# Patient Record
Sex: Female | Born: 1935 | Race: Black or African American | Hispanic: No | Marital: Married | Smoking: Never smoker
Health system: Southern US, Community
[De-identification: ages and names within clinical notes are randomized; demographics above are authoritative.]

## PROBLEM LIST (undated history)

## (undated) DIAGNOSIS — I479 Paroxysmal tachycardia, unspecified: Secondary | ICD-10-CM

## (undated) DIAGNOSIS — Z860100 Personal history of colon polyps, unspecified: Secondary | ICD-10-CM

## (undated) DIAGNOSIS — E785 Hyperlipidemia, unspecified: Secondary | ICD-10-CM

## (undated) DIAGNOSIS — Z800 Family history of malignant neoplasm of digestive organs: Secondary | ICD-10-CM

## (undated) DIAGNOSIS — D649 Anemia, unspecified: Secondary | ICD-10-CM

## (undated) DIAGNOSIS — K5730 Diverticulosis of large intestine without perforation or abscess without bleeding: Secondary | ICD-10-CM

## (undated) DIAGNOSIS — I10 Essential (primary) hypertension: Secondary | ICD-10-CM

## (undated) DIAGNOSIS — K219 Gastro-esophageal reflux disease without esophagitis: Secondary | ICD-10-CM

## (undated) DIAGNOSIS — C649 Malignant neoplasm of unspecified kidney, except renal pelvis: Secondary | ICD-10-CM

## (undated) HISTORY — DX: Diverticulosis of large intestine without perforation or abscess without bleeding: K57.30

## (undated) HISTORY — PX: CATARACT EXTRACTION, BILATERAL: SHX1313

## (undated) HISTORY — DX: Paroxysmal tachycardia, unspecified: I47.9

## (undated) HISTORY — DX: Essential (primary) hypertension: I10

## (undated) HISTORY — DX: Family history of malignant neoplasm of digestive organs: Z80.0

## (undated) HISTORY — DX: Personal history of colon polyps, unspecified: Z86.0100

## (undated) HISTORY — DX: Hyperlipidemia, unspecified: E78.5

## (undated) HISTORY — DX: Gastro-esophageal reflux disease without esophagitis: K21.9

## (undated) HISTORY — DX: Anemia, unspecified: D64.9

## (undated) HISTORY — DX: Personal history of colonic polyps: Z86.010

---

## 1970-08-08 HISTORY — PX: ABDOMINAL HYSTERECTOMY: SHX81

## 1992-03-20 ENCOUNTER — Encounter (INDEPENDENT_AMBULATORY_CARE_PROVIDER_SITE_OTHER): Payer: Self-pay | Admitting: *Deleted

## 1993-08-19 ENCOUNTER — Encounter (INDEPENDENT_AMBULATORY_CARE_PROVIDER_SITE_OTHER): Payer: Self-pay | Admitting: *Deleted

## 1998-05-05 ENCOUNTER — Encounter (INDEPENDENT_AMBULATORY_CARE_PROVIDER_SITE_OTHER): Payer: Self-pay | Admitting: *Deleted

## 2002-11-21 ENCOUNTER — Encounter: Payer: Self-pay | Admitting: Internal Medicine

## 2003-05-15 ENCOUNTER — Encounter: Payer: Self-pay | Admitting: Internal Medicine

## 2004-10-06 ENCOUNTER — Ambulatory Visit: Payer: Self-pay | Admitting: Internal Medicine

## 2005-03-14 ENCOUNTER — Ambulatory Visit: Payer: Self-pay | Admitting: Family Medicine

## 2005-03-28 ENCOUNTER — Ambulatory Visit: Payer: Self-pay | Admitting: Family Medicine

## 2005-04-12 ENCOUNTER — Ambulatory Visit: Payer: Self-pay | Admitting: Internal Medicine

## 2005-09-08 HISTORY — PX: BREAST LUMPECTOMY: SHX2

## 2005-09-23 ENCOUNTER — Encounter: Admission: RE | Admit: 2005-09-23 | Discharge: 2005-09-23 | Payer: Self-pay | Admitting: General Surgery

## 2005-10-10 ENCOUNTER — Encounter (INDEPENDENT_AMBULATORY_CARE_PROVIDER_SITE_OTHER): Payer: Self-pay | Admitting: *Deleted

## 2005-10-10 ENCOUNTER — Ambulatory Visit (HOSPITAL_BASED_OUTPATIENT_CLINIC_OR_DEPARTMENT_OTHER): Admission: RE | Admit: 2005-10-10 | Discharge: 2005-10-10 | Payer: Self-pay | Admitting: General Surgery

## 2005-10-21 ENCOUNTER — Ambulatory Visit: Payer: Self-pay | Admitting: Internal Medicine

## 2006-04-05 ENCOUNTER — Ambulatory Visit: Payer: Self-pay | Admitting: Internal Medicine

## 2006-04-17 ENCOUNTER — Ambulatory Visit: Payer: Self-pay | Admitting: Internal Medicine

## 2006-05-17 ENCOUNTER — Ambulatory Visit: Payer: Self-pay | Admitting: Internal Medicine

## 2006-09-08 HISTORY — PX: BREAST BIOPSY: SHX20

## 2006-09-15 ENCOUNTER — Encounter: Admission: RE | Admit: 2006-09-15 | Discharge: 2006-09-15 | Payer: Self-pay | Admitting: Internal Medicine

## 2006-09-26 ENCOUNTER — Encounter: Admission: RE | Admit: 2006-09-26 | Discharge: 2006-09-26 | Payer: Self-pay | Admitting: Internal Medicine

## 2006-09-29 ENCOUNTER — Encounter (INDEPENDENT_AMBULATORY_CARE_PROVIDER_SITE_OTHER): Payer: Self-pay | Admitting: Specialist

## 2006-09-29 ENCOUNTER — Encounter: Admission: RE | Admit: 2006-09-29 | Discharge: 2006-09-29 | Payer: Self-pay | Admitting: Internal Medicine

## 2006-10-16 ENCOUNTER — Ambulatory Visit: Payer: Self-pay | Admitting: Internal Medicine

## 2006-10-16 LAB — CONVERTED CEMR LAB
ALT: 25 units/L (ref 0–40)
Albumin: 3.8 g/dL (ref 3.5–5.2)
BUN: 10 mg/dL (ref 6–23)
CO2: 33 meq/L — ABNORMAL HIGH (ref 19–32)
Calcium: 9.9 mg/dL (ref 8.4–10.5)
Chloride: 96 meq/L (ref 96–112)
Cholesterol: 220 mg/dL (ref 0–200)
Creatinine, Ser: 0.9 mg/dL (ref 0.4–1.2)
Creatinine,U: 36.3 mg/dL
Direct LDL: 154.1 mg/dL
GFR calc Af Amer: 80 mL/min
GFR calc non Af Amer: 66 mL/min
Glucose, Bld: 76 mg/dL (ref 70–99)
HDL: 45.3 mg/dL (ref >39.0)
Hgb A1c MFr Bld: 7.9 % — ABNORMAL HIGH (ref 4.6–6.0)
LDL Cholesterol: 131 mg/dL — ABNORMAL HIGH (ref 0–99)
Microalb Creat Ratio: 5.5 mg/g (ref 0.0–30.0)
Microalb, Ur: 0.2 mg/dL (ref 0.0–1.9)
Phosphorus: 3.7 mg/dL (ref 2.3–4.6)
Potassium: 3.7 meq/L (ref 3.5–5.1)
Sodium: 139 meq/L (ref 135–145)
Total CHOL/HDL Ratio: 4.9
Triglycerides: 217 mg/dL (ref 0–149)
VLDL: 43 mg/dL — ABNORMAL HIGH (ref 0–40)

## 2006-12-22 ENCOUNTER — Telehealth: Payer: Self-pay | Admitting: Internal Medicine

## 2007-03-19 ENCOUNTER — Telehealth (INDEPENDENT_AMBULATORY_CARE_PROVIDER_SITE_OTHER): Payer: Self-pay | Admitting: *Deleted

## 2007-04-05 DIAGNOSIS — E119 Type 2 diabetes mellitus without complications: Secondary | ICD-10-CM

## 2007-04-05 DIAGNOSIS — K5730 Diverticulosis of large intestine without perforation or abscess without bleeding: Secondary | ICD-10-CM | POA: Insufficient documentation

## 2007-04-05 DIAGNOSIS — I10 Essential (primary) hypertension: Secondary | ICD-10-CM | POA: Insufficient documentation

## 2007-04-05 DIAGNOSIS — E785 Hyperlipidemia, unspecified: Secondary | ICD-10-CM | POA: Insufficient documentation

## 2007-04-05 DIAGNOSIS — K219 Gastro-esophageal reflux disease without esophagitis: Secondary | ICD-10-CM

## 2007-04-05 DIAGNOSIS — J309 Allergic rhinitis, unspecified: Secondary | ICD-10-CM | POA: Insufficient documentation

## 2007-04-18 ENCOUNTER — Ambulatory Visit: Payer: Self-pay | Admitting: Internal Medicine

## 2007-04-19 LAB — CONVERTED CEMR LAB
ALT: 34 units/L (ref 0–35)
Albumin: 4.2 g/dL (ref 3.5–5.2)
BUN: 14 mg/dL (ref 6–23)
CO2: 29 meq/L (ref 19–32)
Calcium: 9.9 mg/dL (ref 8.4–10.5)
Chloride: 101 meq/L (ref 96–112)
Cholesterol: 169 mg/dL (ref 0–200)
Creatinine, Ser: 0.9 mg/dL (ref 0.4–1.2)
GFR calc Af Amer: 80 mL/min
GFR calc non Af Amer: 66 mL/min
Glucose, Bld: 116 mg/dL — ABNORMAL HIGH (ref 70–99)
HDL: 46.7 mg/dL (ref >39.0)
Hgb A1c MFr Bld: 7.8 % — ABNORMAL HIGH (ref 4.6–6.0)
LDL Cholesterol: 91 mg/dL (ref 0–99)
Phosphorus: 4.2 mg/dL (ref 2.3–4.6)
Potassium: 4 meq/L (ref 3.5–5.1)
Sodium: 140 meq/L (ref 135–145)
Total CHOL/HDL Ratio: 3.6
Triglycerides: 159 mg/dL — ABNORMAL HIGH (ref 0–149)
VLDL: 32 mg/dL (ref 0–40)

## 2007-07-16 ENCOUNTER — Telehealth (INDEPENDENT_AMBULATORY_CARE_PROVIDER_SITE_OTHER): Payer: Self-pay | Admitting: *Deleted

## 2007-09-17 ENCOUNTER — Encounter: Admission: RE | Admit: 2007-09-17 | Discharge: 2007-09-17 | Payer: Self-pay | Admitting: Internal Medicine

## 2007-09-18 ENCOUNTER — Encounter (INDEPENDENT_AMBULATORY_CARE_PROVIDER_SITE_OTHER): Payer: Self-pay | Admitting: *Deleted

## 2007-10-16 ENCOUNTER — Ambulatory Visit: Payer: Self-pay | Admitting: Internal Medicine

## 2007-10-22 LAB — CONVERTED CEMR LAB
ALT: 44 units/L — ABNORMAL HIGH (ref 0–35)
AST: 50 units/L — ABNORMAL HIGH (ref 0–37)
Albumin: 4.4 g/dL (ref 3.5–5.2)
Alkaline Phosphatase: 66 units/L (ref 39–117)
BUN: 10 mg/dL (ref 6–23)
Basophils Absolute: 0 10*3/uL (ref 0.0–0.1)
Basophils Relative: 0.3 % (ref 0.0–1.0)
Bilirubin, Direct: 0.1 mg/dL (ref 0.0–0.3)
CO2: 30 meq/L (ref 19–32)
Calcium: 10 mg/dL (ref 8.4–10.5)
Chloride: 100 meq/L (ref 96–112)
Cholesterol: 148 mg/dL (ref 0–200)
Creatinine, Ser: 0.9 mg/dL (ref 0.4–1.2)
Creatinine,U: 38.8 mg/dL
Eosinophils Absolute: 0.1 10*3/uL (ref 0.0–0.6)
Eosinophils Relative: 1.4 % (ref 0.0–5.0)
GFR calc Af Amer: 80 mL/min
GFR calc non Af Amer: 66 mL/min
Glucose, Bld: 212 mg/dL — ABNORMAL HIGH (ref 70–99)
HCT: 38 % (ref 36.0–46.0)
HDL: 42.4 mg/dL (ref >39.0)
Hemoglobin: 12.3 g/dL (ref 12.0–15.0)
Hgb A1c MFr Bld: 8.8 % — ABNORMAL HIGH (ref 4.6–6.0)
LDL Cholesterol: 67 mg/dL (ref 0–99)
Lymphocytes Relative: 33.1 % (ref 12.0–46.0)
MCHC: 32.3 g/dL (ref 30.0–36.0)
MCV: 86.8 fL (ref 78.0–100.0)
Microalb Creat Ratio: 25.8 mg/g (ref 0.0–30.0)
Microalb, Ur: 1 mg/dL (ref 0.0–1.9)
Monocytes Absolute: 0.4 10*3/uL (ref 0.2–0.7)
Monocytes Relative: 6.1 % (ref 3.0–11.0)
Neutro Abs: 4.1 10*3/uL (ref 1.4–7.7)
Neutrophils Relative %: 59.1 % (ref 43.0–77.0)
Phosphorus: 4 mg/dL (ref 2.3–4.6)
Platelets: 203 10*3/uL (ref 150–400)
Potassium: 3.8 meq/L (ref 3.5–5.1)
RBC: 4.38 M/uL (ref 3.87–5.11)
RDW: 14.5 % (ref 11.5–14.6)
Sodium: 139 meq/L (ref 135–145)
TSH: 4.01 microintl units/mL (ref 0.35–5.50)
Total Bilirubin: 1.2 mg/dL (ref 0.3–1.2)
Total CHOL/HDL Ratio: 3.5
Total Protein: 7.5 g/dL (ref 6.0–8.3)
Triglycerides: 195 mg/dL — ABNORMAL HIGH (ref 0–149)
VLDL: 39 mg/dL (ref 0–40)
WBC: 6.9 10*3/uL (ref 4.5–10.5)

## 2007-10-29 ENCOUNTER — Ambulatory Visit: Payer: Self-pay | Admitting: Internal Medicine

## 2007-10-29 ENCOUNTER — Encounter: Payer: Self-pay | Admitting: Internal Medicine

## 2007-11-07 ENCOUNTER — Ambulatory Visit: Payer: Self-pay | Admitting: Internal Medicine

## 2007-11-23 ENCOUNTER — Telehealth: Payer: Self-pay | Admitting: Internal Medicine

## 2007-11-26 ENCOUNTER — Encounter: Payer: Self-pay | Admitting: Internal Medicine

## 2007-12-07 ENCOUNTER — Encounter: Payer: Self-pay | Admitting: Internal Medicine

## 2007-12-07 ENCOUNTER — Ambulatory Visit: Payer: Self-pay | Admitting: Internal Medicine

## 2007-12-10 ENCOUNTER — Encounter: Payer: Self-pay | Admitting: Family Medicine

## 2007-12-13 ENCOUNTER — Ambulatory Visit: Payer: Self-pay | Admitting: Internal Medicine

## 2007-12-13 ENCOUNTER — Encounter: Payer: Self-pay | Admitting: Internal Medicine

## 2007-12-13 DIAGNOSIS — Z860100 Personal history of colon polyps, unspecified: Secondary | ICD-10-CM | POA: Insufficient documentation

## 2007-12-28 ENCOUNTER — Ambulatory Visit: Payer: Self-pay | Admitting: Internal Medicine

## 2007-12-28 ENCOUNTER — Encounter: Payer: Self-pay | Admitting: Internal Medicine

## 2008-01-01 ENCOUNTER — Encounter: Payer: Self-pay | Admitting: Internal Medicine

## 2008-03-06 ENCOUNTER — Telehealth (INDEPENDENT_AMBULATORY_CARE_PROVIDER_SITE_OTHER): Payer: Self-pay | Admitting: *Deleted

## 2008-03-20 ENCOUNTER — Telehealth (INDEPENDENT_AMBULATORY_CARE_PROVIDER_SITE_OTHER): Payer: Self-pay | Admitting: *Deleted

## 2008-03-27 ENCOUNTER — Telehealth (INDEPENDENT_AMBULATORY_CARE_PROVIDER_SITE_OTHER): Payer: Self-pay | Admitting: *Deleted

## 2008-04-15 ENCOUNTER — Ambulatory Visit: Payer: Self-pay | Admitting: Internal Medicine

## 2008-04-16 LAB — CONVERTED CEMR LAB
ALT: 34 units/L (ref 0–35)
AST: 31 units/L (ref 0–37)
Albumin: 4.3 g/dL (ref 3.5–5.2)
Alkaline Phosphatase: 57 units/L (ref 39–117)
BUN: 13 mg/dL (ref 6–23)
Bilirubin, Direct: 0.1 mg/dL (ref 0.0–0.3)
CO2: 29 meq/L (ref 19–32)
Calcium: 9.7 mg/dL (ref 8.4–10.5)
Chloride: 105 meq/L (ref 96–112)
Cholesterol: 131 mg/dL (ref 0–200)
Creatinine, Ser: 0.8 mg/dL (ref 0.4–1.2)
GFR calc Af Amer: 91 mL/min
GFR calc non Af Amer: 76 mL/min
Glucose, Bld: 108 mg/dL — ABNORMAL HIGH (ref 70–99)
HDL: 44.5 mg/dL (ref >39.0)
Hgb A1c MFr Bld: 6.8 % — ABNORMAL HIGH (ref 4.6–6.0)
LDL Cholesterol: 68 mg/dL (ref 0–99)
Phosphorus: 4.3 mg/dL (ref 2.3–4.6)
Potassium: 4.1 meq/L (ref 3.5–5.1)
Sodium: 141 meq/L (ref 135–145)
Total Bilirubin: 1.2 mg/dL (ref 0.3–1.2)
Total CHOL/HDL Ratio: 2.9
Total Protein: 7.3 g/dL (ref 6.0–8.3)
Triglycerides: 91 mg/dL (ref 0–149)
VLDL: 18 mg/dL (ref 0–40)

## 2008-06-25 ENCOUNTER — Encounter: Payer: Self-pay | Admitting: Internal Medicine

## 2008-09-17 ENCOUNTER — Encounter: Admission: RE | Admit: 2008-09-17 | Discharge: 2008-09-17 | Payer: Self-pay | Admitting: Internal Medicine

## 2008-09-17 ENCOUNTER — Encounter: Payer: Self-pay | Admitting: Internal Medicine

## 2008-10-14 ENCOUNTER — Ambulatory Visit: Payer: Self-pay | Admitting: Internal Medicine

## 2008-10-15 ENCOUNTER — Telehealth: Payer: Self-pay | Admitting: Family Medicine

## 2008-10-16 ENCOUNTER — Encounter: Payer: Self-pay | Admitting: Internal Medicine

## 2008-10-16 LAB — CONVERTED CEMR LAB
Albumin: 4.1 g/dL (ref 3.5–5.2)
BUN: 11 mg/dL (ref 6–23)
Basophils Absolute: 0 10*3/uL (ref 0.0–0.1)
Basophils Relative: 0.5 % (ref 0.0–3.0)
CO2: 29 meq/L (ref 19–32)
Calcium: 9.5 mg/dL (ref 8.4–10.5)
Chloride: 101 meq/L (ref 96–112)
Creatinine, Ser: 0.8 mg/dL (ref 0.4–1.2)
Eosinophils Absolute: 0.1 10*3/uL (ref 0.0–0.7)
Eosinophils Relative: 1.1 % (ref 0.0–5.0)
Ferritin: 5.2 ng/mL — ABNORMAL LOW (ref 10.0–291.0)
GFR calc Af Amer: 91 mL/min
GFR calc non Af Amer: 75 mL/min
Glucose, Bld: 101 mg/dL — ABNORMAL HIGH (ref 70–99)
HCT: 33.2 % — ABNORMAL LOW (ref 36.0–46.0)
Hemoglobin: 10.7 g/dL — ABNORMAL LOW (ref 12.0–15.0)
Hgb A1c MFr Bld: 7.1 % — ABNORMAL HIGH (ref 4.6–6.0)
Lymphocytes Relative: 34 % (ref 12.0–46.0)
MCHC: 32.1 g/dL (ref 30.0–36.0)
MCV: 77.1 fL — ABNORMAL LOW (ref 78.0–100.0)
Monocytes Absolute: 0.5 10*3/uL (ref 0.1–1.0)
Monocytes Relative: 6.5 % (ref 3.0–12.0)
Neutro Abs: 4 10*3/uL (ref 1.4–7.7)
Neutrophils Relative %: 57.9 % (ref 43.0–77.0)
Phosphorus: 3.8 mg/dL (ref 2.3–4.6)
Platelets: 234 10*3/uL (ref 150–400)
Potassium: 4 meq/L (ref 3.5–5.1)
RBC: 4.31 M/uL (ref 3.87–5.11)
RDW: 18.3 % — ABNORMAL HIGH (ref 11.5–14.6)
Sodium: 138 meq/L (ref 135–145)
WBC: 7 10*3/uL (ref 4.5–10.5)

## 2008-10-17 ENCOUNTER — Telehealth: Payer: Self-pay | Admitting: Family Medicine

## 2008-11-27 ENCOUNTER — Ambulatory Visit: Payer: Self-pay | Admitting: Internal Medicine

## 2008-11-28 LAB — CONVERTED CEMR LAB
Basophils Absolute: 0 10*3/uL (ref 0.0–0.1)
Basophils Relative: 0.2 % (ref 0.0–3.0)
Eosinophils Absolute: 0.1 10*3/uL (ref 0.0–0.7)
Eosinophils Relative: 1.5 % (ref 0.0–5.0)
Ferritin: 7 ng/mL — ABNORMAL LOW (ref 10.0–291.0)
HCT: 33.3 % — ABNORMAL LOW (ref 36.0–46.0)
Hemoglobin: 11 g/dL — ABNORMAL LOW (ref 12.0–15.0)
Lymphocytes Relative: 33.8 % (ref 12.0–46.0)
Lymphs Abs: 2 10*3/uL (ref 0.7–4.0)
MCHC: 33.2 g/dL (ref 30.0–36.0)
MCV: 79.8 fL (ref 78.0–100.0)
Monocytes Absolute: 0.4 10*3/uL (ref 0.1–1.0)
Monocytes Relative: 6.8 % (ref 3.0–12.0)
Neutro Abs: 3.4 10*3/uL (ref 1.4–7.7)
Neutrophils Relative %: 57.7 % (ref 43.0–77.0)
Platelets: 205 10*3/uL (ref 150.0–400.0)
RBC: 4.17 M/uL (ref 3.87–5.11)
RDW: 22.4 % — ABNORMAL HIGH (ref 11.5–14.6)
WBC: 5.9 10*3/uL (ref 4.5–10.5)

## 2008-12-09 ENCOUNTER — Encounter: Payer: Self-pay | Admitting: Internal Medicine

## 2009-02-13 ENCOUNTER — Telehealth: Payer: Self-pay | Admitting: Internal Medicine

## 2009-02-16 ENCOUNTER — Encounter: Payer: Self-pay | Admitting: Internal Medicine

## 2009-04-22 ENCOUNTER — Ambulatory Visit: Payer: Self-pay | Admitting: Internal Medicine

## 2009-04-24 LAB — CONVERTED CEMR LAB
ALT: 32 units/L (ref 0–35)
AST: 35 units/L (ref 0–37)
Albumin: 4.2 g/dL (ref 3.5–5.2)
Alkaline Phosphatase: 46 units/L (ref 39–117)
BUN: 13 mg/dL (ref 6–23)
Basophils Absolute: 0 10*3/uL (ref 0.0–0.1)
Basophils Relative: 0.6 % (ref 0.0–3.0)
Bilirubin, Direct: 0 mg/dL (ref 0.0–0.3)
CO2: 28 meq/L (ref 19–32)
Calcium: 9.5 mg/dL (ref 8.4–10.5)
Chloride: 100 meq/L (ref 96–112)
Cholesterol: 150 mg/dL (ref 0–200)
Creatinine, Ser: 0.9 mg/dL (ref 0.4–1.2)
Creatinine,U: 57 mg/dL
Eosinophils Absolute: 0.1 10*3/uL (ref 0.0–0.7)
Eosinophils Relative: 1.4 % (ref 0.0–5.0)
Glucose, Bld: 125 mg/dL — ABNORMAL HIGH (ref 70–99)
HCT: 36.1 % (ref 36.0–46.0)
HDL: 38.3 mg/dL — ABNORMAL LOW (ref >39.00)
Hemoglobin: 12.2 g/dL (ref 12.0–15.0)
Hgb A1c MFr Bld: 7.1 % — ABNORMAL HIGH (ref 4.6–6.5)
LDL Cholesterol: 84 mg/dL (ref 0–99)
Lymphocytes Relative: 28.3 % (ref 12.0–46.0)
Lymphs Abs: 1.9 10*3/uL (ref 0.7–4.0)
MCHC: 33.7 g/dL (ref 30.0–36.0)
MCV: 88.2 fL (ref 78.0–100.0)
Microalb Creat Ratio: 3.5 mg/g (ref 0.0–30.0)
Microalb, Ur: 0.2 mg/dL (ref 0.0–1.9)
Monocytes Absolute: 0.3 10*3/uL (ref 0.1–1.0)
Monocytes Relative: 4.9 % (ref 3.0–12.0)
Neutro Abs: 4.3 10*3/uL (ref 1.4–7.7)
Neutrophils Relative %: 64.8 % (ref 43.0–77.0)
Phosphorus: 4.1 mg/dL (ref 2.3–4.6)
Platelets: 183 10*3/uL (ref 150.0–400.0)
Potassium: 4.2 meq/L (ref 3.5–5.1)
RBC: 4.09 M/uL (ref 3.87–5.11)
RDW: 16.2 % — ABNORMAL HIGH (ref 11.5–14.6)
Sodium: 138 meq/L (ref 135–145)
TSH: 3.24 microintl units/mL (ref 0.35–5.50)
Total Bilirubin: 1.5 mg/dL — ABNORMAL HIGH (ref 0.3–1.2)
Total CHOL/HDL Ratio: 4
Total Protein: 7.6 g/dL (ref 6.0–8.3)
Triglycerides: 141 mg/dL (ref 0.0–149.0)
VLDL: 28.2 mg/dL (ref 0.0–40.0)
WBC: 6.6 10*3/uL (ref 4.5–10.5)

## 2009-06-03 ENCOUNTER — Ambulatory Visit: Payer: Self-pay | Admitting: Family Medicine

## 2009-06-03 LAB — CONVERTED CEMR LAB: Blood Glucose, Fingerstick: 181

## 2009-06-09 ENCOUNTER — Telehealth: Payer: Self-pay | Admitting: Internal Medicine

## 2009-09-18 ENCOUNTER — Encounter: Admission: RE | Admit: 2009-09-18 | Discharge: 2009-09-18 | Payer: Self-pay | Admitting: Internal Medicine

## 2009-10-16 ENCOUNTER — Ambulatory Visit: Payer: Self-pay | Admitting: Internal Medicine

## 2009-10-19 LAB — CONVERTED CEMR LAB: Hgb A1c MFr Bld: 7.3 % — ABNORMAL HIGH (ref 4.6–6.1)

## 2010-02-24 ENCOUNTER — Ambulatory Visit: Payer: Self-pay | Admitting: Family Medicine

## 2010-02-24 DIAGNOSIS — M79609 Pain in unspecified limb: Secondary | ICD-10-CM | POA: Insufficient documentation

## 2010-02-25 LAB — CONVERTED CEMR LAB
Basophils Absolute: 0 10*3/uL (ref 0.0–0.1)
Basophils Relative: 0.5 % (ref 0.0–3.0)
Eosinophils Absolute: 0.1 10*3/uL (ref 0.0–0.7)
Eosinophils Relative: 1.2 % (ref 0.0–5.0)
HCT: 27.7 % — ABNORMAL LOW (ref 36.0–46.0)
Hemoglobin: 8.6 g/dL — ABNORMAL LOW (ref 12.0–15.0)
Lymphocytes Relative: 20.7 % (ref 12.0–46.0)
Lymphs Abs: 2 10*3/uL (ref 0.7–4.0)
MCHC: 31.1 g/dL (ref 30.0–36.0)
MCV: 67.7 fL — ABNORMAL LOW (ref 78.0–100.0)
Monocytes Absolute: 0.7 10*3/uL (ref 0.1–1.0)
Monocytes Relative: 7.1 % (ref 3.0–12.0)
Neutro Abs: 6.7 10*3/uL (ref 1.4–7.7)
Neutrophils Relative %: 70.5 % (ref 43.0–77.0)
Platelets: 246 10*3/uL (ref 150.0–400.0)
RBC: 4.09 M/uL (ref 3.87–5.11)
RDW: 22.6 % — ABNORMAL HIGH (ref 11.5–14.6)
Uric Acid, Serum: 7.1 mg/dL — ABNORMAL HIGH (ref 2.4–7.0)
WBC: 9.6 10*3/uL (ref 4.5–10.5)

## 2010-04-14 ENCOUNTER — Ambulatory Visit: Payer: Self-pay | Admitting: Internal Medicine

## 2010-04-14 DIAGNOSIS — D649 Anemia, unspecified: Secondary | ICD-10-CM

## 2010-04-14 DIAGNOSIS — M109 Gout, unspecified: Secondary | ICD-10-CM

## 2010-04-15 LAB — CONVERTED CEMR LAB
ALT: 33 units/L (ref 0–35)
AST: 32 units/L (ref 0–37)
Albumin: 4.1 g/dL (ref 3.5–5.2)
Alkaline Phosphatase: 48 units/L (ref 39–117)
BUN: 11 mg/dL (ref 6–23)
Basophils Absolute: 0 10*3/uL (ref 0.0–0.1)
Basophils Relative: 0.6 % (ref 0.0–3.0)
Bilirubin, Direct: 0.2 mg/dL (ref 0.0–0.3)
CO2: 27 meq/L (ref 19–32)
Calcium: 9.4 mg/dL (ref 8.4–10.5)
Chloride: 105 meq/L (ref 96–112)
Cholesterol: 133 mg/dL (ref 0–200)
Creatinine, Ser: 0.8 mg/dL (ref 0.4–1.2)
Creatinine,U: 153.8 mg/dL
Eosinophils Absolute: 0.1 10*3/uL (ref 0.0–0.7)
Eosinophils Relative: 1.4 % (ref 0.0–5.0)
Ferritin: 8.6 ng/mL — ABNORMAL LOW (ref 10.0–291.0)
GFR calc non Af Amer: 90.76 mL/min (ref >60)
Glucose, Bld: 78 mg/dL (ref 70–99)
HCT: 30 % — ABNORMAL LOW (ref 36.0–46.0)
HDL: 37.6 mg/dL — ABNORMAL LOW (ref >39.00)
Hemoglobin: 9.5 g/dL — ABNORMAL LOW (ref 12.0–15.0)
Hgb A1c MFr Bld: 6.1 % (ref 4.6–6.5)
Iron: 276 ug/dL — ABNORMAL HIGH (ref 42–145)
LDL Cholesterol: 69 mg/dL (ref 0–99)
Lymphocytes Relative: 26 % (ref 12.0–46.0)
Lymphs Abs: 1.9 10*3/uL (ref 0.7–4.0)
MCHC: 31.6 g/dL (ref 30.0–36.0)
MCV: 73.1 fL — ABNORMAL LOW (ref 78.0–100.0)
Microalb Creat Ratio: 2.1 mg/g (ref 0.0–30.0)
Microalb, Ur: 3.3 mg/dL — ABNORMAL HIGH (ref 0.0–1.9)
Monocytes Absolute: 0.5 10*3/uL (ref 0.1–1.0)
Monocytes Relative: 6.8 % (ref 3.0–12.0)
Neutro Abs: 4.8 10*3/uL (ref 1.4–7.7)
Neutrophils Relative %: 65.2 % (ref 43.0–77.0)
Phosphorus: 4 mg/dL (ref 2.3–4.6)
Platelets: 235 10*3/uL (ref 150.0–400.0)
Potassium: 4.2 meq/L (ref 3.5–5.1)
RBC: 4.1 M/uL (ref 3.87–5.11)
RDW: 28.2 % — ABNORMAL HIGH (ref 11.5–14.6)
Saturation Ratios: 60.2 % — ABNORMAL HIGH (ref 20.0–50.0)
Sodium: 141 meq/L (ref 135–145)
TSH: 2.88 microintl units/mL (ref 0.35–5.50)
Total Bilirubin: 1 mg/dL (ref 0.3–1.2)
Total CHOL/HDL Ratio: 4
Total Protein: 6.8 g/dL (ref 6.0–8.3)
Transferrin: 327.6 mg/dL (ref 212.0–360.0)
Triglycerides: 132 mg/dL (ref 0.0–149.0)
VLDL: 26.4 mg/dL (ref 0.0–40.0)
WBC: 7.4 10*3/uL (ref 4.5–10.5)

## 2010-05-12 ENCOUNTER — Ambulatory Visit: Payer: Self-pay | Admitting: Internal Medicine

## 2010-05-13 LAB — CONVERTED CEMR LAB
Basophils Absolute: 0 10*3/uL (ref 0.0–0.1)
Basophils Relative: 0.4 % (ref 0.0–3.0)
Eosinophils Absolute: 0.1 10*3/uL (ref 0.0–0.7)
Eosinophils Relative: 1 % (ref 0.0–5.0)
Ferritin: 10.4 ng/mL (ref 10.0–291.0)
HCT: 32.4 % — ABNORMAL LOW (ref 36.0–46.0)
Hemoglobin: 10.3 g/dL — ABNORMAL LOW (ref 12.0–15.0)
Iron: 31 ug/dL — ABNORMAL LOW (ref 42–145)
Lymphocytes Relative: 24.7 % (ref 12.0–46.0)
Lymphs Abs: 2 10*3/uL (ref 0.7–4.0)
MCHC: 31.7 g/dL (ref 30.0–36.0)
MCV: 77.6 fL — ABNORMAL LOW (ref 78.0–100.0)
Monocytes Absolute: 0.5 10*3/uL (ref 0.1–1.0)
Monocytes Relative: 6.3 % (ref 3.0–12.0)
Neutro Abs: 5.4 10*3/uL (ref 1.4–7.7)
Neutrophils Relative %: 67.6 % (ref 43.0–77.0)
Platelets: 267 10*3/uL (ref 150.0–400.0)
RBC: 4.17 M/uL (ref 3.87–5.11)
RDW: 26.4 % — ABNORMAL HIGH (ref 11.5–14.6)
Saturation Ratios: 6.5 % — ABNORMAL LOW (ref 20.0–50.0)
Transferrin: 341.3 mg/dL (ref 212.0–360.0)
WBC: 8 10*3/uL (ref 4.5–10.5)

## 2010-07-13 ENCOUNTER — Ambulatory Visit: Payer: Self-pay | Admitting: Internal Medicine

## 2010-07-13 DIAGNOSIS — R5383 Other fatigue: Secondary | ICD-10-CM

## 2010-07-13 DIAGNOSIS — R5381 Other malaise: Secondary | ICD-10-CM

## 2010-08-13 ENCOUNTER — Ambulatory Visit
Admission: RE | Admit: 2010-08-13 | Discharge: 2010-08-13 | Payer: Self-pay | Source: Home / Self Care | Attending: Internal Medicine | Admitting: Internal Medicine

## 2010-08-13 ENCOUNTER — Encounter: Payer: Self-pay | Admitting: Internal Medicine

## 2010-08-13 DIAGNOSIS — D509 Iron deficiency anemia, unspecified: Secondary | ICD-10-CM | POA: Insufficient documentation

## 2010-08-16 ENCOUNTER — Encounter: Payer: Self-pay | Admitting: Internal Medicine

## 2010-08-16 ENCOUNTER — Ambulatory Visit
Admission: RE | Admit: 2010-08-16 | Discharge: 2010-08-16 | Payer: Self-pay | Source: Home / Self Care | Attending: Internal Medicine | Admitting: Internal Medicine

## 2010-08-16 LAB — HM COLONOSCOPY

## 2010-08-17 ENCOUNTER — Telehealth: Payer: Self-pay | Admitting: Internal Medicine

## 2010-08-18 ENCOUNTER — Telehealth: Payer: Self-pay | Admitting: Internal Medicine

## 2010-08-19 ENCOUNTER — Ambulatory Visit
Admission: RE | Admit: 2010-08-19 | Discharge: 2010-08-19 | Payer: Self-pay | Source: Home / Self Care | Attending: Internal Medicine | Admitting: Internal Medicine

## 2010-08-19 ENCOUNTER — Encounter: Payer: Self-pay | Admitting: Internal Medicine

## 2010-08-23 ENCOUNTER — Encounter: Payer: Self-pay | Admitting: Internal Medicine

## 2010-08-23 LAB — GLUCOSE, CAPILLARY
Glucose-Capillary: 113 mg/dL — ABNORMAL HIGH (ref 70–99)
Glucose-Capillary: 111 mg/dL — ABNORMAL HIGH (ref 70–99)
Glucose-Capillary: 79 mg/dL (ref 70–99)

## 2010-08-26 ENCOUNTER — Encounter: Payer: Self-pay | Admitting: Internal Medicine

## 2010-08-26 ENCOUNTER — Ambulatory Visit
Admission: RE | Admit: 2010-08-26 | Discharge: 2010-08-26 | Payer: Self-pay | Source: Home / Self Care | Attending: Internal Medicine | Admitting: Internal Medicine

## 2010-08-28 ENCOUNTER — Other Ambulatory Visit: Payer: Self-pay | Admitting: Internal Medicine

## 2010-08-28 DIAGNOSIS — Z1239 Encounter for other screening for malignant neoplasm of breast: Secondary | ICD-10-CM

## 2010-08-30 ENCOUNTER — Telehealth: Payer: Self-pay | Admitting: Internal Medicine

## 2010-08-30 DIAGNOSIS — T189XXA Foreign body of alimentary tract, part unspecified, initial encounter: Secondary | ICD-10-CM | POA: Insufficient documentation

## 2010-08-31 ENCOUNTER — Ambulatory Visit (HOSPITAL_COMMUNITY)
Admission: RE | Admit: 2010-08-31 | Discharge: 2010-08-31 | Payer: Self-pay | Source: Home / Self Care | Attending: Internal Medicine | Admitting: Internal Medicine

## 2010-09-08 DIAGNOSIS — I479 Paroxysmal tachycardia, unspecified: Secondary | ICD-10-CM

## 2010-09-08 HISTORY — DX: Paroxysmal tachycardia, unspecified: I47.9

## 2010-09-09 NOTE — Procedures (Signed)
Summary: Capsule instruction  Capsule instruction   Imported By: Lester Earth 08/24/2010 08:21:22  _____________________________________________________________________  External Attachment:    Type:   Image     Comment:   External Document

## 2010-09-09 NOTE — Assessment & Plan Note (Signed)
Summary: 6 MONTH FOLLOW UP/[facility]   Vital Signs:  Patient profile:   75 year old female Weight:      146 pounds Temp:     98 degrees F oral Pulse rate:   72 / minute Pulse rhythm:   regular BP sitting:   128 / 80  (left arm) Cuff size:   regular  Vitals Entered By: Mervin Hack CMA Duncan Dull) (October 16, 2009 2:03 PM) CC: 6 month follow-up   History of Present Illness: doing great!  Notes a mole on her left hip goes back for several years seems to have gotten rougher lately  Diabetes okay variable sugars AM sugars as high as 180 despite the insulin better later in day No hypoglycemic reacitons No sores or pain in feet  Still getting allergy immunotherapy hasn't seen the positive impact as yet  Not really exercising lately wants to restart walking  No chest pain No SOB No edema  No myalgias or stomach trouble on chol med  Allergies: 1)  * Sulfa (Sulfonamides) Group  Past History:  Past medical, surgical, family and social histories (including risk factors) reviewed for relevance to current acute and chronic problems.  Past Medical History: Reviewed history from 12/13/2007 and no changes required. Allergic rhinitis Diabetes mellitus, type II Diverticulosis, colon GERD Hyperlipidemia Hypertension colon polyps  Past Surgical History: Reviewed history from 04/05/2007 and no changes required. Hysterectomy 1972 Vaginal deliveries  x 2,  C-section x 1 Echo - mild LVH 10/'04 Left breast lump - benign ([facility]) 02/07 Right breast bx - benign 02/08  Family History: Reviewed history from 12/13/2007 and no changes required. Father: Died at age 37, colon cancer Mother: Died at age 50, renal failure Siblings: One brother deceased at age 35, one brother deceased at age 40 lung cancer.  One sister deceased at age 61 CVA; one sister living with cerebral aneurysm.  Social History: Reviewed history from 04/05/2007 and no changes required. 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   Neck:  supple, no masses, no thyromegaly, no carotid bruits, and no cervical lymphadenopathy.   Lungs:  normal respiratory effort and normal breath sounds.   Heart:  normal rate, regular rhythm, no murmur, and no gallop.   Abdomen:  soft and non-tender.   Msk:  no joint tenderness and no joint swelling.   Pulses:  1+ in feet Extremities:  no edema Skin:  no rashes and no suspicious lesions.   Seborrheic keratosis on left hip Psych:  normally interactive, good eye contact, not anxious appearing, and not depressed appearing.    Diabetes Management Exam:    Foot Exam (with socks and/or shoes not present):       Sensory-Pinprick/Light touch:          Left medial foot (L-4): normal          Left dorsal foot (L-5): normal          Left lateral foot (S-1): normal          Right medial foot (L-4): normal          Right dorsal foot (L-5): normal          Right lateral foot (S-1): normal       Inspection:  Left foot: normal          Right foot: normal       Nails:          Left foot: normal          Right foot: normal   Impression & Recommendations:  Problem # 1:  DIABETES MELLITUS, TYPE II (ICD-250.00) Assessment Unchanged  has had good control will increase lantus if worsened control  Her updated medication list for this problem includes:    Lantus Solostar 100 Unit/ml Soln (Insulin glargine) .Marland KitchenMarland KitchenMarland KitchenMarland Kitchen 10 units daily.    Glipizide 5 Mg Tb24 (Glipizide) .Marland Kitchen... Take 1 tablet by mouth once a day    Metformin Hcl 1000 Mg Tabs (Metformin hcl) .Marland Kitchen... Take 1 tablet by mouth twice a day    Altace 10 Mg Caps (Ramipril) .Marland Kitchen... Take 1 capsule by mouth once a day    Aspirin 81 Mg Tbec (Aspirin) .Marland Kitchen... Take one by  mouth once a day  Labs Reviewed: Creat: 0.9 (04/22/2009)     Last Eye Exam: No diabetic retinopathy.   Bilateral cataracts (12/09/2008) Reviewed HgBA1c results: 7.1 (04/22/2009)  7.1 (10/14/2008)  Orders: Venipuncture (04540) Specimen Handling (98119) T- Hemoglobin A1C (14782-95621)  Problem # 2:  HYPERTENSION (ICD-401.9) Assessment: Unchanged good control no change needed  Her updated medication list for this problem includes:    Altace 10 Mg Caps (Ramipril) .Marland Kitchen... Take 1 capsule by mouth once a day    Hydrochlorothiazide 25 Mg Tabs (Hydrochlorothiazide) .Marland Kitchen... Take 1 tablet by mouth once a day  BP today: 128/80 Prior BP: 138/80 (06/03/2009)  Labs Reviewed: K+: 4.2 (04/22/2009) Creat: : 0.9 (04/22/2009)   Chol: 150 (04/22/2009)   HDL: 38.30 (04/22/2009)   LDL: 84 (04/22/2009)   TG: 141.0 (04/22/2009)  Problem # 3:  HYPERLIPIDEMIA (ICD-272.4) Assessment: Unchanged good control check again next time  Her updated medication list for this problem includes:    Simvastatin 20 Mg Tabs (Simvastatin) .Marland Kitchen... Take 1 tablet by mouth at bedtime  Labs Reviewed: SGOT: 35 (04/22/2009)   SGPT: 32 (04/22/2009)   HDL:38.30 (04/22/2009), 44.5 (04/15/2008)  LDL:84 (04/22/2009), 68 (04/15/2008)  Chol:150 (04/22/2009), 131 (04/15/2008)  Trig:141.0 (04/22/2009), 91 (04/15/2008)  Problem # 4:  GERD (ICD-530.81) Assessment: Unchanged quiet on med  Her updated medication list for this problem includes:    Protonix 40 Mg Tbec (Pantoprazole sodium) .Marland Kitchen... Take 1 tablet by mouth once a day  Complete Medication List: 1)  Lantus Solostar 100 Unit/ml Soln (Insulin glargine) .Marland Kitchen.. 10 units daily. 2)  Glipizide 5 Mg Tb24 (Glipizide) .... Take 1 tablet by mouth once a day 3)  Metformin Hcl 1000 Mg Tabs (Metformin hcl) .... Take 1 tablet by mouth twice a day 4)  Altace 10 Mg Caps (Ramipril) .... Take 1 capsule by mouth once a day 5)  Hydrochlorothiazide 25 Mg Tabs (Hydrochlorothiazide) .... Take 1  tablet by mouth once a day 6)  Simvastatin 20 Mg Tabs (Simvastatin) .... Take 1 tablet by mouth at bedtime 7)  Protonix 40 Mg Tbec (Pantoprazole sodium) .... Take 1 tablet by mouth once a day 8)  Aspirin 81 Mg Tbec (Aspirin) .... Take one by mouth once a day 9)  Aleve 220 Mg Tabs (Naproxen sodium) .... As needed 10)  Allergy Injections  11)  Bd U/f Short Pen Needle 31g X 8 Mm Misc (Insulin pen needle) .... Use as directed   icd-9 code 250.00 12)  Multivitamins Tabs (Multiple vitamin) .... Take one by mouth once a day  Patient  Instructions: 1)  Please schedule a follow-up appointment in 6 months .  Prescriptions: PROTONIX 40 MG TBEC (PANTOPRAZOLE SODIUM) Take 1 tablet by mouth once a day  #90 x 3   Entered by:   Mervin Hack CMA (AAMA)   Authorized by:   Cindee Salt MD   Signed by:   Mervin Hack CMA (AAMA) on 10/16/2009   Method used:   Electronically to        [facility]* (mail-order)             ,          Ph: [phone]       Fax: [phone]   RxID:   [id number] SIMVASTATIN 20 MG TABS (SIMVASTATIN) Take 1 tablet by mouth at bedtime  #90 x 3   Entered by:   Mervin Hack CMA (AAMA)   Authorized by:   Cindee Salt MD   Signed by:   Mervin Hack CMA (AAMA) on 10/16/2009   Method used:   Electronically to        [facility]* (mail-order)             ,          Ph: [phone]       Fax: [phone]   RxID:   [id number] HYDROCHLOROTHIAZIDE 25 MG TABS (HYDROCHLOROTHIAZIDE) Take 1 tablet by mouth once a day  #90 x 3   Entered by:   Mervin Hack CMA (AAMA)   Authorized by:   Cindee Salt MD   Signed by:   Mervin Hack CMA (AAMA) on 10/16/2009   Method used:   Electronically to        [facility]* (mail-order)             ,          Ph: [phone]       Fax: [phone]   RxID:   [id number] ALTACE 10 MG CAPS (RAMIPRIL) Take 1 capsule by mouth once a day  #90 x 3   Entered by:   Mervin Hack CMA (AAMA)    Authorized by:   Cindee Salt MD   Signed by:   Mervin Hack CMA (AAMA) on 10/16/2009   Method used:   Electronically to        [facility]* (mail-order)             ,          Ph: [phone]       Fax: [phone]   RxID:   [id number] METFORMIN HCL 1000 MG TABS (METFORMIN HCL) Take 1 tablet by mouth twice a day  #180 x 3   Entered by:   Mervin Hack CMA (AAMA)   Authorized by:   Cindee Salt MD   Signed by:   Mervin Hack CMA (AAMA) on 10/16/2009   Method used:   Electronically to        [facility]* (mail-order)             ,          Ph: [phone]       Fax: [phone]   RxID:   [id number] GLIPIZIDE 5 MG TB24 (GLIPIZIDE) Take 1 tablet by mouth once a day  #90 x 3   Entered by:   Mervin Hack CMA (AAMA)   Authorized by:   Cindee Salt MD   Signed by:   Mervin Hack CMA (AAMA) on 10/16/2009   Method used:  Electronically to        [facility]* (mail-order)             ,          Ph: [phone]       Fax: [phone]   RxID:   [id number]   Current Allergies (reviewed today): * SULFA (SULFONAMIDES) GROUP

## 2010-09-09 NOTE — Progress Notes (Signed)
Summary: needs to go on iron pills  Phone Note Call from Patient   Caller: Patient Call For: Cindee Salt MD Summary of Call: Pt saw Dr. Marina Goodell yesterday and had upper GI.  He suggested she go back on her iron pils, that is also mentioned in his colonoscopy  procedure note.  [facility].             Lowella Petties CMA, AAMA  August 17, 2010 10:05 AM  Initial call taken by: Lowella Petties CMA, AAMA,  August 17, 2010 10:07 AM  Follow-up for Phone Call        received E-Scribe Request, ok to refill? DeShannon Smith CMA Duncan Dull)  August 17, 2010 10:26 AM   yes--- #30 x 3 Follow-up by: Cindee Salt MD,  August 17, 2010 11:15 AM  Additional Follow-up for Phone Call Additional follow up Details #1::        Rx Called In, Spoke with patient and advised results.  Additional Follow-up by: Mervin Hack CMA (AAMA),  August 17, 2010 11:25 AM    New/Updated Medications: FERROUS SULFATE 325 (65 FE) MG TABS (FERROUS SULFATE) take 1 by mouth once daily Prescriptions: FERROUS SULFATE 325 (65 FE) MG TABS (FERROUS SULFATE) take 1 by mouth once daily  #30 x 3   Entered by:   Mervin Hack CMA (AAMA)   Authorized by:   Cindee Salt MD   Signed by:   Mervin Hack CMA (AAMA) on 08/17/2010   Method used:   [facility] [address] [facility]retail[facility]       [address]       [place], [place]  [address]       Ph: [phone] or [phone]       Fax: [phone]   RxID:   [id number]

## 2010-09-09 NOTE — Assessment & Plan Note (Signed)
Summary: 6 m f/u dlo   Vital Signs:  Patient profile:   75 year old female Weight:      143 pounds BMI:     26.25 Temp:     98.2 degrees F oral Pulse rate:   68 / minute Pulse rhythm:   regular BP sitting:   150 / 80  (left arm) Cuff size:   regular  Vitals Entered By: Mervin Hack CMA Duncan Dull) (April 14, 2010 10:58 AM) CC: 6 month follow-up   History of Present Illness: Foot is better no longer taking the HCTZ not on the pain medications   Reviewed anemia Hgb normal 1 year ago No black stools No other bleeding or bruising up to date on colonoscopy Has taken occ aleve over time No stomach pain regularly  checks sugars twice a day 134 this AM--- usually 150 in AM better as day goes on No adjustment of insulin no hypoglycemic reactions  Doesn't check BP No headaches No chest pain  No SOB  Allergies: 1)  * Sulfa (Sulfonamides) Group  Past History:  Past medical, surgical, family and social histories (including risk factors) reviewed for relevance to current acute and chronic problems.  Past Medical History: Allergic rhinitis Diabetes mellitus, type II Diverticulosis, colon GERD Hyperlipidemia Hypertension colon polyps Gout  Past Surgical History: Reviewed history from 04/05/2007 and no changes required. Hysterectomy 1972 Vaginal deliveries  x 2,  C-section x 1 Echo - mild LVH 10/'04 Left breast lump - benign ([facility]) 02/07 Right breast bx - benign 02/08  Family History: Reviewed history from 12/13/2007 and no changes required. Father: Died at age 79, colon cancer Mother: Died at age 61, renal failure Siblings: One brother deceased at age 81, one brother deceased at age 51 lung cancer.  One sister deceased at age 23 CVA; one sister living with cerebral aneurysm.  Social History: Reviewed history from 04/05/2007 and no changes required. 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   Neck:  supple, no masses, no thyromegaly, no carotid bruits, and no cervical lymphadenopathy.   Lungs:  normal respiratory effort, no intercostal retractions, no accessory muscle use, and normal breath sounds.   Heart:  normal rate, regular rhythm, no murmur, and no gallop.   Abdomen:  soft and non-tender.   Msk:  no joint tenderness and no joint swelling.   Pulses:  1+ in feet Extremities:  no edema or varicosities Skin:  no suspicious lesions and no ulcerations.   Psych:  normally interactive, good eye contact, not anxious appearing, and not depressed appearing.    Diabetes Management Exam:    Foot Exam (with socks and/or shoes not present):       Sensory-Pinprick/Light touch:          Left medial foot (L-4): normal          Left dorsal foot (L-5): normal          Left lateral foot (S-1): normal          Right medial foot (L-4): normal          Right dorsal foot (L-5): normal          Right lateral foot (S-1): normal       Inspection:  Left foot: normal          Right foot: normal       Nails:          Left foot: normal          Right foot: normal    Eye Exam:       Eye Exam done elsewhere          Date: 12/07/2009          Results: normal          Done by: Dr Barbie Haggis   Impression & Recommendations:  Problem # 1:  ANEMIA, OTHER UNSPEC (ICD-285.9) Assessment New  probably iron deficient will check labs up to date on colon stopped her aleve still on protonix will supplement---to GI if doesn't go up again  Her updated medication list for this problem includes:    Ferrous Sulfate 324 Mg Tbec (Ferrous sulfate) .Marland Kitchen... 1 tab by mouth daily  Orders: TLB-CBC Platelet - w/Differential (85025-CBCD) TLB-IBC Pnl (Iron/FE;Transferrin) (83550-IBC) TLB-Ferritin (82728-FER)  Problem # 2:  GOUT  (ICD-274.9) Assessment: Improved would try allopurinol if recurrences off the HCTZ  The following medications were removed from the medication list:    Aleve 220 Mg Tabs (Naproxen sodium) .Marland Kitchen... As needed Her updated medication list for this problem includes:    Colcrys 0.6 Mg Tabs (Colchicine) .Marland Kitchen... 2 tabs po  at the first sign of flare, followed in 1 hour with a 1 tab by mouth , then 1 tab by mouth bid  Problem # 3:  DIABETES MELLITUS, TYPE II (ICD-250.00) Assessment: Unchanged  seems to still have reasonable control will check labs  The following medications were removed from the medication list:    Altace 5 Mg Caps (Ramipril) .Marland KitchenMarland KitchenMarland KitchenMarland Kitchen 3 tabs by mouth daily. Her updated medication list for this problem includes:    Lantus Solostar 100 Unit/ml Soln (Insulin glargine) .Marland KitchenMarland KitchenMarland KitchenMarland Kitchen 10 units daily.    Glipizide 5 Mg Tb24 (Glipizide) .Marland Kitchen... Take 1 tablet by mouth once a day    Metformin Hcl 1000 Mg Tabs (Metformin hcl) .Marland Kitchen... Take 1 tablet by mouth twice a day    Aspirin 81 Mg Tbec (Aspirin) .Marland Kitchen... Take one by mouth once a day    Ramipril 10 Mg Caps (Ramipril) .Marland Kitchen... 1 tab daily  Labs Reviewed: Creat: 0.9 (04/22/2009)     Last Eye Exam: normal (12/07/2009) Reviewed HgBA1c results: 7.3 (10/16/2009)  7.1 (04/22/2009)  Orders: Venipuncture (16109) TLB-Microalbumin/Creat Ratio, Urine (82043-MALB) TLB-A1C / Hgb A1C (Glycohemoglobin) (83036-A1C)  Problem # 4:  HYPERTENSION (ICD-401.9) Assessment: Unchanged  now off HCTZ will check urine microal no addition unless urine positive she will monitor at home  The following medications were removed from the medication list:    Hydrochlorothiazide 25 Mg Tabs (Hydrochlorothiazide) .Marland Kitchen... Take 1 tablet by mouth once a day    Altace 5 Mg Caps (Ramipril) .Marland KitchenMarland KitchenMarland KitchenMarland Kitchen 3 tabs by mouth daily. Her updated medication list for this problem includes:    Ramipril 10 Mg Caps (Ramipril) .Marland Kitchen... 1 tab daily  BP today: 150/80 Prior BP: 142/72 (02/24/2010)  Labs  Reviewed: K+: 4.2 (04/22/2009) Creat: : 0.9 (04/22/2009)   Chol: 150 (04/22/2009)   HDL: 38.30 (04/22/2009)   LDL: 84 (04/22/2009)   TG: 141.0 (04/22/2009)  Orders: TLB-TSH (Thyroid Stimulating Hormone) (84443-TSH) TLB-Renal Function Panel (80069-RENAL)  Complete Medication List: 1)  Lantus Solostar 100 Unit/ml Soln (Insulin glargine) .Marland Kitchen.. 10 units daily. 2)  Glipizide 5 Mg Tb24 (Glipizide) .... Take 1 tablet by mouth once a day  3)  Metformin Hcl 1000 Mg Tabs (Metformin hcl) .... Take 1 tablet by mouth twice a day 4)  Simvastatin 20 Mg Tabs (Simvastatin) .... Take 1 tablet by mouth at bedtime 5)  Protonix 40 Mg Tbec (Pantoprazole sodium) .... Take 1 tablet by mouth once a day 6)  Vicodin 5-500 Mg Tabs (Hydrocodone-acetaminophen) .Marland Kitchen.. 1 tab every 6 hours as needed for pain. 7)  Colcrys 0.6 Mg Tabs (Colchicine) .... 2 tabs po  at the first sign of flare, followed in 1 hour with a 1 tab by mouth , then 1 tab by mouth bid 8)  Ferrous Sulfate 324 Mg Tbec (Ferrous sulfate) .Marland Kitchen.. 1 tab by mouth daily 9)  Aspirin 81 Mg Tbec (Aspirin) .... Take one by mouth once a day 10)  Bd U/f Short Pen Needle 31g X 8 Mm Misc (Insulin pen needle) .... Use as directed   icd-9 code 250.00 11)  Multivitamins Tabs (Multiple vitamin) .... Take one by mouth once a day 12)  Ramipril 10 Mg Caps (Ramipril) .Marland Kitchen.. 1 tab daily  Other Orders: TLB-Lipid Panel (80061-LIPID) TLB-Hepatic/Liver Function Pnl (80076-HEPATIC)  Patient Instructions: 1)  Please schedule blood work in about 1 month (CBC with diff-- 285.9) 2)  Please schedule a follow-up appointment in 6 months .   Current Allergies (reviewed today): * SULFA (SULFONAMIDES) GROUP

## 2010-09-09 NOTE — Procedures (Addendum)
Summary: Upper Endoscopy  Patient: Angelica Murphy Note: All result statuses are Final unless otherwise noted.  Tests: (1) Upper Endoscopy (EGD)   EGD Upper Endoscopy       DONE     [facility]     [address]     [place], [place]  [address]           ENDOSCOPY PROCEDURE REPORT           PATIENT:  Angelica, Murphy  MR#:  [id number]     BIRTHDATE:  1936-05-30, 74 yrs. old  GENDER:  female           ENDOSCOPIST:  Wilhemina Bonito. Eda Keys, MD     Referred by:  Office           PROCEDURE DATE:  08/16/2010     PROCEDURE:  EGD with biopsy, 86578     ASA CLASS:  Class II     INDICATIONS:  iron deficiency anemia           MEDICATIONS:   There was residual sedation effect present from     prior procedure.     TOPICAL ANESTHETIC:  Exactacain Spray           DESCRIPTION OF PROCEDURE:   After the risks benefits and     alternatives of the procedure were thoroughly explained, informed     consent was obtained.  The LB [id number] endoscope was     introduced through the mouth and advanced to the third portion of     the duodenum, without limitations.  The instrument was slowly     withdrawn as the mucosa was fully examined.     <<PROCEDUREIMAGES>>           The upper, middle, and distal third of the esophagus were     carefully inspected and no abnormalities were noted. The z-line     was well seen at the GEJ. The endoscope was pushed into the fundus     which was normal including a retroflexed view. The antrum,gastric     body, first and second part of the duodenum were unremarkable.     Retroflexed views revealed no abnormalities. Duodenal bx taken to r/o     sprue.   The scope was then withdrawn from the patient and the     procedure completed.           COMPLICATIONS:  None           ENDOSCOPIC IMPRESSION:     1) Normal EGD     2) Iron deficiency anemia           RECOMMENDATIONS:     1) Capsule endoscopy to be arranged by my office     2) resume oral iron therapy           ______________________________     Wilhemina Bonito. Eda Keys, MD           CC:  Karie Schwalbe, MD, The Patient           n.     eSIGNED:   Wilhemina Bonito. Eda Keys at 08/16/2010 04:23 PM           Audelia Acton, [id number]  Note: An exclamation mark (!) indicates a result that was not dispersed into the flowsheet. Document Creation Date: 08/16/2010 4:23 PM _______________________________________________________________________  (1) Order result status: Final Collection or observation date-time: 08/16/2010 16:19 Requested date-time:  Receipt date-time:  Reported  date-time:  Referring Physician:   Ordering Physician: Fransico Setters [id number]) Specimen Source:  Source: Launa Grill Order Number: [id number] Lab site:

## 2010-09-09 NOTE — Procedures (Signed)
Summary: Colon/[place]  Colon/[place]   Imported By: Lester El Moro 08/16/2010 07:18:52  _____________________________________________________________________  External Attachment:    Type:   Image     Comment:   External Document

## 2010-09-09 NOTE — Assessment & Plan Note (Addendum)
Summary: New onset iron deficiency anemia   History of Present Illness Visit Type: Follow-up Visit Primary GI MD: Yancey Flemings MD Primary Provider: Tillman Abide, MD Requesting Provider: Dr. Tillman Abide Chief Complaint: anemia/ iron def. History of Present Illness:   75 year old African American female with hypertension, hyperlipidemia, type 2 diabetes mellitus requiring insulin, gout, arthritis, GERD, and adenomatous colon polyps. She presents today regarding new onset iron deficiency anemia. This summer she developed significant problems with fatigue and lack of energy. CBC July 20 revealed anemia with a hemoglobin of 8.6 and MCV 67.7. Most recent hemoglobin May 12, 2010 reveal hemoglobin of 10.3 with MCV of 77.6. This after being on iron. Ferritin level was 10 and current saturation 6.5%. Patient's GI review of systems is only remarkable for occasional pyrosis. She takes pantoprazole 40 mg daily for GERD. She denies melena or hematochezia. She does not donate blood. No hematuria or other bleeding. She does take ibuprofen daily in addition to daily aspirin. Ibuprofen is used for body aches. Her weight has been stable. She has had multiple prior colonoscopies, most recently in May of 2009 with several small adenomas and diverticulosis. She has a family history of colon cancer in her father around age 69. No prior upper endoscopy. Her chronic medical problems are reported as stable   GI Review of Systems      Denies abdominal pain, acid reflux, belching, bloating, chest pain, dysphagia with liquids, dysphagia with solids, heartburn, loss of appetite, nausea, vomiting, vomiting blood, weight loss, and  weight gain.        Denies anal fissure, black tarry stools, change in bowel habit, constipation, diarrhea, diverticulosis, fecal incontinence, heme positive stool, hemorrhoids, irritable bowel syndrome, jaundice, light color stool, liver problems, rectal bleeding, and  rectal pain.    Current  Medications (verified): 1)  Ramipril 10 Mg Caps (Ramipril) .Marland Kitchen.. 1 Tab Daily 2)  Lantus Solostar 100 Unit/ml  Soln (Insulin Glargine) .Marland Kitchen.. 10 Units Daily. 3)  Glipizide 5 Mg Tb24 (Glipizide) .... Take 1 Tablet By Mouth Once A Day 4)  Metformin Hcl 1000 Mg Tabs (Metformin Hcl) .... Take 1 Tablet By Mouth Twice A Day 5)  Simvastatin 20 Mg Tabs (Simvastatin) .... Take 1 Tablet By Mouth At Bedtime 6)  Protonix 40 Mg Tbec (Pantoprazole Sodium) .... Take 1 Tablet By Mouth Once A Day 7)  Colcrys 0.6 Mg Tabs (Colchicine) .... 2 Tabs Po  At The First Sign of Flare, Followed in 1 Hour With A 1 Tab By Mouth , Then 1 Tab By Mouth Two Times A Day 8)  Aspirin 81 Mg  Tbec (Aspirin) .... Take One By Mouth Once A Day 9)  Bd U/f Short Pen Needle 31g X 8 Mm Misc (Insulin Pen Needle) .... Use As Directed   Icd-9 Code 250.00 10)  Multivitamins   Tabs (Multiple Vitamin) .... Take One By Mouth Once A Day  Allergies (verified): 1)  * Sulfa (Sulfonamides) Group  Past History:  Past Medical History: Reviewed history from 08/10/2010 and no changes required. Allergic rhinitis Diabetes mellitus, type II Diverticulosis, colon GERD Hyperlipidemia Hypertension colon polyps Gout Hemorrhoids  Past Surgical History: Reviewed history from 04/05/2007 and no changes required. Hysterectomy 1972 Vaginal deliveries  x 2,  C-section x 1 Echo - mild LVH 10/'04 Left breast lump - benign ([facility]) 02/07 Right breast bx - benign 02/08  Family History: Reviewed history from 12/13/2007 and no changes required. Father: Died at age 70, colon cancer Mother: Died at age 38, renal  failure Siblings: One brother deceased at age 36, one brother deceased at age 51 lung cancer.  One sister deceased at age 67 CVA; one sister living with cerebral aneurysm.  Social History: Reviewed history from 04/05/2007 and no changes required. 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  The patient denies anemia, anxiety-new, back pain, blood in urine, breast changes/lumps, change in vision, confusion, cough, coughing up blood, depression-new, fainting, fever, hearing problems, heart murmur, heart rhythm changes, itching, menstrual pain, night sweats, nosebleeds, pregnancy symptoms, skin rash, sore throat, swollen lymph glands, thirst - excessive , urination - excessive , urination changes/pain, urine leakage, vision changes, and voice change.    Vital Signs:  Patient profile:   75 year old female Height:      62 inches Weight:      146 pounds BMI:     26.80 Pulse rate:   80 / minute Pulse rhythm:   regular BP sitting:   152 / 78  (left arm) Cuff size:   regular  Vitals Entered By: June McMurray CMA Duncan Dull) (August 13, 2010 10:12 AM)  Physical Exam  General:  Well developed, well nourished, no acute distress. Head:  Normocephalic and atraumatic. Eyes:  PERRLA, no icterus. Conjunctiva pale Ears:  Normal auditory acuity. Mouth:  No deformity or lesions. Neck:  Supple; no masses or thyromegaly. Lungs:  Clear throughout to auscultation. Heart:  Regular rate and rhythm; no murmurs, rubs,  or bruits. Abdomen:  Soft, nontender and nondistended. No masses, hepatosplenomegaly or hernias noted. Normal bowel sounds. Rectal:  deferred until colonoscopy Msk:  Symmetrical with no gross deformities. Normal posture. Pulses:  Normal pulses noted. Extremities:  No clubbing, cyanosis, edema or deformities noted. Neurologic:  Alert and  oriented x4;  grossly normal neurologically. Skin:  Intact without significant lesions or rashes. Psych:  Alert and cooperative. Normal mood and affect.   Impression & Recommendations:  Problem # 1:   ANEMIA-IRON DEFICIENCY (ICD-37.49) 75 year old female multiple medical problems who presents with iron deficiency anemia. Rule out occult GI mucosal lesion.  Plan: #1. Colonoscopy and upper endoscopy. The nature of the procedures as well as the risks, benefits, and alternatives were reviewed. She understood and agreed to proceed. #2. Movi prep prescribed. The patient instructed on its use #3. Let us diabetic medications for the procedure to avoid unwanted hypoglycemia. See below. #4. May need chronic iron replacement therapy and/or further evaluation with capsule endoscopy pending the above  Problem # 2:  DIABETES MELLITUS, TYPE II (ICD-250.00) adjustment medications for the procedures. She has been instructed to take one half of her evening insulin the day prior to the exam. Also to hold her oral agents the morning of the procedure. We will monitor her blood sugar immediately before and after her procedure.  Problem # 3:  PERSONAL HX COLONIC POLYPS (ICD-V12.72) history of adenomatous colon polyps. Most recent colonoscopy May 2009. Followup at this time due to interval development of iron deficiency anemia  Problem # 4:  GERD (ICD-530.81) some breakthrough GERD symptoms despite PPI therapy. Upper endoscopy planned  Problem # 5:  DIVERTICULOSIS, COLON (ICD-562.10) Assessment: Comment Only  Other Orders: Colon/Endo (Colon/Endo)  Patient Instructions: 1)  Colon/Endo [facility] 08/16/10 3:00 pm arrive at 2:00 pm 2)  Movi  prep instructions given and rx. sent to pharmacy. 3)  Colonoscopy and Flexible Sigmoidoscopy brochure given.  4)  Upper Endoscopy brochure given.  5)  1/2 Insulin evening before procedure and hold Metformin and Glipizide morning of procedure. 6)  Copy sent to : Tillman Abide, MD 7)  The medication list was reviewed and reconciled.  All changed / newly prescribed medications were explained.  A complete medication list was provided to the patient /  caregiver. Prescriptions: MOVIPREP 100 GM  SOLR (PEG-KCL-NACL-NASULF-NA ASC-C) As per prep instructions.  #1 x 0   Entered by:   Milford Cage NCMA   Authorized by:   Hilarie Fredrickson MD   Signed by:   Milford Cage NCMA on 08/13/2010   Method used:   Electronically to        [facility]  [address] (retail)       [address]       [place], [place]  [address]       Ph: [phone] or [phone]       Fax: [phone]   RxID:   [id number]

## 2010-09-09 NOTE — Assessment & Plan Note (Signed)
Summary: LEFT FOOT HURTING AND SWOLLEN   Vital Signs:  Patient profile:   75 year old female Weight:      142 pounds Temp:     97.8 degrees F oral Pulse rate:   84 / minute Pulse rhythm:   regular BP sitting:   142 / 72  (right arm) Cuff size:   regular  Vitals Entered By: Lowella Petties CMA (February 24, 2010 11:34 AM) CC: acute left foot pain   History of Present Illness: 75 yo here for acute onset of left big toe pain. No known trauma or injury. Woke up and side of left toe was very painful to even the slightest touch and warm. Never had anything like this before.  Does have h/o DJD, DM but never been told she has any other type of arthritis or gout.  Of note, takes HCTZ for HTN and edema.  Current Medications (verified): 1)  Lantus Solostar 100 Unit/ml  Soln (Insulin Glargine) .Marland Kitchen.. 10 Units Daily. 2)  Glipizide 5 Mg Tb24 (Glipizide) .... Take 1 Tablet By Mouth Once A Day 3)  Metformin Hcl 1000 Mg Tabs (Metformin Hcl) .... Take 1 Tablet By Mouth Twice A Day 4)  Altace 10 Mg Caps (Ramipril) .... Take 1 Capsule By Mouth Once A Day 5)  Hydrochlorothiazide 25 Mg Tabs (Hydrochlorothiazide) .... Take 1 Tablet By Mouth Once A Day 6)  Simvastatin 20 Mg Tabs (Simvastatin) .... Take 1 Tablet By Mouth At Bedtime 7)  Protonix 40 Mg Tbec (Pantoprazole Sodium) .... Take 1 Tablet By Mouth Once A Day 8)  Aspirin 81 Mg  Tbec (Aspirin) .... Take One By Mouth Once A Day 9)  Aleve 220 Mg Tabs (Naproxen Sodium) .... As Needed 10)  Allergy Injections 11)  Bd U/f Short Pen Needle 31g X 8 Mm Misc (Insulin Pen Needle) .... Use As Directed   Icd-9 Code 250.00 12)  Multivitamins   Tabs (Multiple Vitamin) .... Take One By Mouth Once A Day 13)  Vicodin 5-500 Mg Tabs (Hydrocodone-Acetaminophen) .Marland Kitchen.. 1 Tab Every 6 Hours As Needed For Pain.  Allergies (verified): 1)  * Sulfa (Sulfonamides) Group  Past History:  Past Medical History: Last updated: 2008/01/03 Allergic rhinitis Diabetes mellitus, type  II Diverticulosis, colon GERD Hyperlipidemia Hypertension colon polyps  Past Surgical History: Last updated: 04/05/2007 Hysterectomy 1972 Vaginal deliveries  x 2,  C-section x 1 Echo - mild LVH 10/'04 Left breast lump - benign ([facility]) 02/07 Right breast bx - benign 02/08  Family History: Last updated: 03-Jan-2008 Father: Died at age 37, colon cancer Mother: Died at age 66, renal failure Siblings: One brother deceased at age 36, one brother deceased at age 57 lung cancer.  One sister deceased at age 71 CVA; one sister living with cerebral aneurysm.  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 MS:  Complains of joint pain and joint redness; denies loss of strength.  Physical Exam  General:  alert and normal appearance.     Foot/Ankle Exam  Foot Exam:    Right:    Inspection:  Normal    Palpation:  Normal    Stability:  stable    Tenderness:  no    Swelling:  no    Erythema:  no    Left:  Inspection:  Abnormal    Palpation:  Abnormal       Location:  1st MTP    Stability:  stable    Tenderness:  yes    Swelling:  yes    Erythema:  yes    warm to touch   Impression & Recommendations:  Problem # 1:  FOOT PAIN, LEFT (ICD-729.5) Assessment New Seems most consistent with gout vs inflammatory process. Will check CBC, uric acid and get a foot film. Advised holding HCTZ until she hears from [place] tomorrow. Vicodin as needed pain. Orders: T-Foot Left Min 3 Views (73630TC) Venipuncture ([address]) TLB-CBC Platelet - w/Differential (85025-CBCD) TLB-Uric Acid, Blood (84550-URIC)  Complete Medication List: 1)  Lantus Solostar 100 Unit/ml Soln (Insulin glargine) .Marland Kitchen.. 10 units daily. 2)  Glipizide 5 Mg Tb24  (Glipizide) .... Take 1 tablet by mouth once a day 3)  Metformin Hcl 1000 Mg Tabs (Metformin hcl) .... Take 1 tablet by mouth twice a day 4)  Altace 10 Mg Caps (Ramipril) .... Take 1 capsule by mouth once a day 5)  Hydrochlorothiazide 25 Mg Tabs (Hydrochlorothiazide) .... Take 1 tablet by mouth once a day 6)  Simvastatin 20 Mg Tabs (Simvastatin) .... Take 1 tablet by mouth at bedtime 7)  Protonix 40 Mg Tbec (Pantoprazole sodium) .... Take 1 tablet by mouth once a day 8)  Aspirin 81 Mg Tbec (Aspirin) .... Take one by mouth once a day 9)  Aleve 220 Mg Tabs (Naproxen sodium) .... As needed 10)  Allergy Injections  11)  Bd U/f Short Pen Needle 31g X 8 Mm Misc (Insulin pen needle) .... Use as directed   icd-9 code 250.00 12)  Multivitamins Tabs (Multiple vitamin) .... Take one by mouth once a day 13)  Vicodin 5-500 Mg Tabs (Hydrocodone-acetaminophen) .Marland Kitchen.. 1 tab every 6 hours as needed for pain.  Patient Instructions: 1)  Please hold your hydrochlorothiazide until you hear from me. Prescriptions: VICODIN 5-500 MG TABS (HYDROCODONE-ACETAMINOPHEN) 1 tab every 6 hours as needed for pain.  #30 x 0   Entered and Authorized by:   Ruthe Mannan MD   Signed by:   Ruthe Mannan MD on 02/24/2010   Method used:   Print then Give to Patient   RxID:   [id number]   Prior Medications (reviewed today): LANTUS SOLOSTAR 100 UNIT/ML  SOLN (INSULIN GLARGINE) 10 units daily. GLIPIZIDE 5 MG TB24 (GLIPIZIDE) Take 1 tablet by mouth once a day METFORMIN HCL 1000 MG TABS (METFORMIN HCL) Take 1 tablet by mouth twice a day ALTACE 10 MG CAPS (RAMIPRIL) Take 1 capsule by mouth once a day HYDROCHLOROTHIAZIDE 25 MG TABS (HYDROCHLOROTHIAZIDE) Take 1 tablet by mouth once a day SIMVASTATIN 20 MG TABS (SIMVASTATIN) Take 1 tablet by mouth at bedtime PROTONIX 40 MG TBEC (PANTOPRAZOLE SODIUM) Take 1 tablet by mouth once a day ASPIRIN 81 MG  TBEC (ASPIRIN) Take one by mouth once a day ALEVE 220 MG TABS (NAPROXEN SODIUM) as  needed ALLERGY INJECTIONS ()  BD U/F SHORT PEN NEEDLE 31G X 8 MM MISC (INSULIN PEN NEEDLE) use as directed   icd-9 code 250.00 MULTIVITAMINS   TABS (MULTIPLE VITAMIN) Take one by mouth once a day Current Allergies (reviewed today): * SULFA (SULFONAMIDES) GROUP

## 2010-09-09 NOTE — Assessment & Plan Note (Signed)
Summary: NO ENERGY X ONE MONTH   Vital Signs:  Patient profile:   75 year old female Weight:      144 pounds Temp:     98.7 degrees F oral Pulse rate:   76 / minute Pulse rhythm:   regular BP sitting:   144 / 70  (left arm) Cuff size:   regular  Vitals Entered By: Mervin Hack CMA Duncan Dull) (July 13, 2010 11:19 AM) CC: no energy   History of Present Illness: "I just don't feel well" Low energy Has body aches--esp soreness in left arm  Feels weak overall  No SOB No fever but did have cold recently  but seems to be over that  Sleeps okay has had some family stressors that are affecting her some Nephew has health issues (agent orange? vs ALS) and she is supporting his wife  Anticoagulation Management History:      Positive risk factors for bleeding include an age of 6 years or older and presence of serious comorbidities.  The bleeding index is 'intermediate risk'.  Positive CHADS2 values include History of HTN and History of Diabetes.  Negative CHADS2 values include Age > 39 years old.     Allergies: 1)  * Sulfa (Sulfonamides) Group  Past History:  Past medical, surgical, family and social histories (including risk factors) reviewed for relevance to current acute and chronic problems.  Past Medical History: Reviewed history from 04/14/2010 and no changes required. Allergic rhinitis Diabetes mellitus, type II Diverticulosis, colon GERD Hyperlipidemia Hypertension colon polyps Gout  Past Surgical History: Reviewed history from 04/05/2007 and no changes required. Hysterectomy 1972 Vaginal deliveries  x 2,  C-section x 1 Echo - mild LVH 10/'04 Left breast lump - benign ([facility]) 02/07 Right breast bx - benign 02/08  Family History: Reviewed history from 12/13/2007 and no changes required. Father: Died at age 60, colon cancer Mother: Died at age 38, renal failure Siblings: One brother deceased at age 10, one brother deceased at age 83 lung cancer.  One  sister deceased at age 35 CVA; one sister living with cerebral aneurysm.  Social History: Reviewed history from 04/05/2007 and no changes required. 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         weight stable eating okay but appetite is off some  Physical Exam  General:  alert and normal appearance.   Neck:  supple, no masses, no thyromegaly, no carotid bruits, and no cervical lymphadenopathy.   Lungs:  normal respiratory effort, no intercostal retractions, no accessory muscle use, and normal breath sounds.   Heart:  normal rate, regular rhythm, no murmur, and no gallop.   Abdomen:  soft, non-tender, normal bowel sounds, no masses, no hepatomegaly, and no splenomegaly.   Msk:  no joint tenderness and no joint swelling.   Extremities:  no edema Psych:  normally interactive, good eye contact, not anxious appearing, and not depressed appearing.     Impression & Recommendations:  Problem # 1:  FATIGUE (ICD-780.79) Assessment New  new problem Likely related to anemia but may be evident now due to concmittant stressors within family  has iron dieficiency anemia so first step is GI eval If nothing found, may need to consider hematology she feels she is handling the stress okay so doesn't feel counselling will help  Orders: Gastroenterology  Referral (GI)  Complete Medication List: 1)  Ramipril 10 Mg Caps (Ramipril) .Marland Kitchen.. 1 tab daily 2)  Lantus Solostar 100 Unit/ml Soln (Insulin glargine) .Marland Kitchen.. 10 units daily. 3)  Glipizide 5 Mg Tb24 (Glipizide) .... Take 1 tablet by mouth once a day 4)  Metformin Hcl 1000 Mg Tabs (Metformin hcl) .... Take 1 tablet by mouth twice a day 5)  Simvastatin 20 Mg Tabs (Simvastatin) .... Take 1 tablet by mouth at bedtime 6)   Protonix 40 Mg Tbec (Pantoprazole sodium) .... Take 1 tablet by mouth once a day 7)  Colcrys 0.6 Mg Tabs (Colchicine) .... 2 tabs po  at the first sign of flare, followed in 1 hour with a 1 tab by mouth , then 1 tab by mouth two times a day 8)  Ferrous Sulfate 324 Mg Tbec (Ferrous sulfate) .Marland Kitchen.. 1 tab by mouth daily 9)  Aspirin 81 Mg Tbec (Aspirin) .... Take one by mouth once a day 10)  Bd U/f Short Pen Needle 31g X 8 Mm Misc (Insulin pen needle) .... Use as directed   icd-9 code 250.00 11)  Multivitamins Tabs (Multiple vitamin) .... Take one by mouth once a day  Anticoagulation Management Assessment/Plan:            Patient Instructions: 1)  Please schedule a follow-up appointment in 2 months.  2)  Referral Appointment Information 3)  Day/Date: 4)  Time: 5)  Place/MD: 6)  Address: 7)  Phone/Fax: 8)  Patient given appointment information. Information/Orders faxed/mailed.   Orders Added: 1)  Gastroenterology Referral [GI] 2)  Est. Patient Level IV [98119]    Current Allergies (reviewed today): * SULFA (SULFONAMIDES) GROUP

## 2010-09-09 NOTE — Letter (Signed)
Summary: [facility] Instructions  [facility]  [address] [place], [place] [address]   Phone: [phone]  Fax: [phone]       JANVI AMMAR    05-Apr-1950    MRN: [id number]        Procedure Day /Date:MONDAY, 08/16/10     Arrival Time:2:00 PM     Procedure Time:3:00 PM     Location of Procedure:                    X  [facility] (4th Floor)         PREPARATION FOR COLONOSCOPY WITH MOVIPREP   Starting TODAY do not eat nuts, seeds, popcorn, corn, beans, peas,  salads, or any raw vegetables.  Do not take any fiber supplements (e.g. Metamucil, Citrucel, and Benefiber).  THE DAY BEFORE YOUR PROCEDURE         DATE: 08/15/10  DAY: SUNDAY  1.  Drink clear liquids the entire day-NO SOLID FOOD  2.  Do not drink anything colored red or purple.  Avoid juices with pulp.  No orange juice.  3.  Drink at least 64 oz. (8 glasses) of fluid/clear liquids during the day to prevent dehydration and help the prep work efficiently.  CLEAR LIQUIDS INCLUDE: Water Jello Ice Popsicles Tea (sugar ok, no milk/cream) Powdered fruit flavored drinks Coffee (sugar ok, no milk/cream) Gatorade Juice: apple, white grape, white cranberry  Lemonade Clear bullion, consomm, broth Carbonated beverages (any kind) Strained chicken noodle soup Hard Candy                             4.  In the morning, mix first dose of MoviPrep solution:    Empty 1 Pouch A and 1 Pouch B into the disposable container    Add lukewarm drinking water to the top line of the container. Mix to dissolve    Refrigerate (mixed solution should be used within 24 hrs)  5.  Begin drinking the prep at 5:00 p.m. The MoviPrep container is divided by 4 marks.   Every 15 minutes drink the solution down to the next mark (approximately 8 oz) until the full liter is complete.   6.  Follow completed prep with 16 oz of clear liquid of your choice (Nothing red or purple).  Continue to drink clear liquids until  bedtime.  7.  Before going to bed, mix second dose of MoviPrep solution:    Empty 1 Pouch A and 1 Pouch B into the disposable container    Add lukewarm drinking water to the top line of the container. Mix to dissolve    Refrigerate  THE DAY OF YOUR PROCEDURE      DATE: 08/16/10 DAY: MONDAY  Beginning at 10:00 a.m. (5 hours before procedure):         1. Every 15 minutes, drink the solution down to the next mark (approx 8 oz) until the full liter is complete.  2. Follow completed prep with 16 oz. of clear liquid of your choice.    3. You may drink clear liquids until  1:00 PM (2 HOURS BEFORE PROCEDURE).   MEDICATION INSTRUCTIONS  Unless otherwise instructed, you should take regular prescription medications with a small sip of water   as early as possible the morning of your procedure.  Diabetic patients - see separate instructions.  Additional medication instructions: _         OTHER INSTRUCTIONS  You will need a responsible adult at least 73 years of age to accompany you and drive you home.   This person must remain in the waiting room during your procedure.  Wear loose fitting clothing that is easily removed.  Leave jewelry and other valuables at home.  However, you may wish to bring a book to read or  an iPod/MP3 player to listen to music as you wait for your procedure to start.  Remove all body piercing jewelry and leave at home.  Total time from sign-in until discharge is approximately 2-3 hours.  You should go home directly after your procedure and rest.  You can resume normal activities the  day after your procedure.  The day of your procedure you should not:   Drive   Make legal decisions   Operate machinery   Drink alcohol   Return to work  You will receive specific instructions about eating, activities and medications before you leave.    The above instructions have been reviewed and explained to me by   _______________________    I fully  understand and can verbalize these instructions _____________________________ Date _________

## 2010-09-09 NOTE — Miscellaneous (Signed)
Summary: Capsule Endo/Anemia r/o AVM  Clinical Lists Changes     Patient here with daughter for capsule endo for Dr. Marina Goodell. Patient verbalized understanding of all verbal and written instructions. Patient tolerated well.  Lot #[id number]        25 Exp 08/2011

## 2010-09-09 NOTE — Procedures (Signed)
Summary: Colon/[place]  Colon/[place]   Imported By: Lester Roxton 08/16/2010 07:16:20  _____________________________________________________________________  External Attachment:    Type:   Image     Comment:   External Document

## 2010-09-09 NOTE — Letter (Signed)
Summary: Diabetic Instructions  [facility]  [address] [place], [place] [address]   Phone: [phone]  Fax: [phone]    Angelica Murphy [birth year] MRN: [id number]   X   ORAL DIABETIC MEDICATION INSTRUCTIONS  The day before your procedure:   Take your diabetic pill as you do normally  The day of your procedure:   Do not take your diabetic pill    We will check your blood sugar levels during the admission process and again in Recovery before discharging you home  ________________________________________________________________________  X   INSULIN (LONG ACTING) MEDICATION INSTRUCTIONS (Lantus, NPH, 70/30, Humulin, Novolin-N)   The day before your procedure:   Take  your regular evening dose    The day of your procedure:   Do not take your morning dose    _  _   INSULIN (SHORT ACTING) MEDICATION INSTRUCTIONS (Regular, Humulog, Novolog)   The day before your procedure:   Do not take your evening dose   The day of your procedure:   Do not take your morning dose   _  _   INSULIN PUMP MEDICATION INSTRUCTIONS  We will contact the physician managing your diabetic care for written dosage instructions for the day before your procedure and the day of your procedure.  Once we have received the instructions, we will contact you.

## 2010-09-09 NOTE — Procedures (Signed)
Summary: Colonoscopy  Patient: [name] Note: All result statuses are Final unless otherwise noted.  Tests: (1) Colonoscopy (COL)   COL Colonoscopy           DONE     [facility]     [address]     [place], [place]  [address]           COLONOSCOPY PROCEDURE REPORT           PATIENT:  Angelica Murphy, Angelica Murphy  MR#:  [id number]     BIRTHDATE:  [birth year], 74 yrs. old  GENDER:  female     ENDOSCOPIST:  Wilhemina Bonito. Eda Keys, MD     REF. BY:  Office     PROCEDURE DATE:  08/16/2010     PROCEDURE:  Surveillance Colonoscopy     ASA CLASS:  Class II     INDICATIONS:  history of pre-cancerous (adenomatous) colon polyps,     surveillance and high-risk screening, Iron deficiency anemia     MEDICATIONS:   Fentanyl 50 mcg IV, Versed 7 mg IV           DESCRIPTION OF PROCEDURE:   After the risks benefits and     alternatives of the procedure were thoroughly explained, informed     consent was obtained.  Digital rectal exam was performed and     revealed no abnormalities.   The LB [id number] endoscope     was introduced through the anus and advanced to the cecum, which     was identified by both the appendix and ileocecal valve, without     limitations.Time to cecum = 7:44 min. The quality of the prep was     excellent, using MoviPrep.  The instrument was then slowly     withdrawn (time = 11:03 min) as the colon was fully examined.     <<PROCEDUREIMAGES>>           FINDINGS:  The terminal ileum appeared normal.  Moderate     diverticulosis was found in the sigmoid colon.  Otherwise normal     colonoscopy without  polyps, masses, vascular ectasias, or     inflammatory changes.   Retroflexed views in the rectum revealed     internal hemorrhoids.    The scope was then withdrawn from the     patient and the procedure completed.           COMPLICATIONS:  None     ENDOSCOPIC IMPRESSION:     1) Normal terminal ileum     2) Moderate diverticulosis in the sigmoid colon     3)  Otherwise normal colonoscopy     4) Internal hemorrhoids           RECOMMENDATIONS:     1) Follow up colonoscopy in 5 years           ______________________________     Wilhemina Bonito. Eda Keys, MD           CC:  Karie Schwalbe, MD; The Patient           n.     eSIGNED:   Wilhemina Bonito. Eda Keys at 08/16/2010 04:10 PM           Angelica Murphy, [id number]  Note: An exclamation mark (!) indicates a result that was not dispersed into the flowsheet. Document Creation Date: 08/16/2010 4:11 PM _______________________________________________________________________  (1) Order result status: Final Collection or observation date-time: 08/16/2010 16:04 Requested date-time:  Receipt date-time:  Reported date-time:  Referring Physician:   Ordering Physician: Fransico Setters [phone]) Specimen Source:  Source: Launa Grill Order Number: [id number] Lab site:   Appended Document: Colonoscopy    Clinical Lists Changes  Observations: Added new observation of COLONNXTDUE: 08/2015 (08/16/2010 16:46)

## 2010-09-09 NOTE — Assessment & Plan Note (Signed)
Summary: f/u on meds/alc   Vital Signs:  Patient profile:   75 year old female Weight:      144 pounds Temp:     98.0 degrees F oral Pulse rate:   68 / minute Pulse rhythm:   regular BP sitting:   150 / 80  (left arm) Cuff size:   regular  Vitals Entered By: Mervin Hack CMA Duncan Dull) (May 12, 2010 10:44 AM) CC: follow-up visit    History of Present Illness: Having sinus headaches Feels popping in maxillary area started about 1 month ago  Trying OTC meds and nasal sprays Uses benedryl and advil sinus  Occ fever at night--feels warm and occ sweat No sig cough No SOB  Energy levels are fine Still on iron No bleeding vaginal or GI that she can tell  No problems with sugars No hypoglycemic spells  Allergies: 1)  * Sulfa (Sulfonamides) Group  Past History:  Past medical, surgical, family and social histories (including risk factors) reviewed for relevance to current acute and chronic problems.  Past Medical History: Reviewed history from 04/14/2010 and no changes required. Allergic rhinitis Diabetes mellitus, type II Diverticulosis, colon GERD Hyperlipidemia Hypertension colon polyps Gout  Past Surgical History: Reviewed history from 04/05/2007 and no changes required. Hysterectomy 1972 Vaginal deliveries  x 2,  C-section x 1 Echo - mild LVH 10/'04 Left breast lump - benign (Young) 02/07 Right breast bx - benign 02/08  Family History: Reviewed history from 12/13/2007 and no changes required. Father: Died at age 85, colon cancer Mother: Died at age 70, renal failure Siblings: One brother deceased at age 39, one brother deceased at age 26 lung cancer.  One sister deceased at age 37 CVA; one sister living with cerebral aneurysm.  Social History: Reviewed history from 04/05/2007 and no changes required. 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   Head:  mild maxillary tenderness but not frontal Ears:  R ear normal and L ear normal.   Nose:  moderate inflammation Mouth:  no erythema and no exudates.   Neck:  supple, no masses, and no cervical lymphadenopathy.   Lungs:  normal respiratory effort, no intercostal retractions, no accessory muscle use, normal breath sounds, no crackles, and no wheezes.     Impression & Recommendations:  Problem # 1:  ALLERGIC RHINITIS (ICD-477.9) Assessment Deteriorated on immunotherapy for about 1 year Ragweed is her worst season on fluticasone  discussed using cetirizine and loratadine  Problem # 2:  ANEMIA, OTHER UNSPEC (ICD-285.9) Assessment: Comment Only  asymptomatic GI referral if not better now  Her updated medication list for this problem includes:    Ferrous Sulfate 324 Mg Tbec (Ferrous sulfate) .Marland Kitchen... 1 tab by mouth daily  Orders: Venipuncture ([address]) TLB-IBC Pnl (Iron/FE;Transferrin) (83550-IBC) TLB-CBC Platelet - w/Differential (85025-CBCD) TLB-Ferritin (82728-FER)  Problem # 3:  DIABETES MELLITUS, TYPE II (ICD-250.00) Assessment: Unchanged good control  Her updated medication list for this problem includes:    Ramipril 10 Mg Caps (Ramipril) .Marland Kitchen... 1 tab daily    Lantus Solostar 100 Unit/ml Soln (Insulin glargine) .Marland KitchenMarland KitchenMarland KitchenMarland Kitchen 10 units daily.    Glipizide 5 Mg Tb24 (Glipizide) .Marland Kitchen... Take 1 tablet by mouth once a day    Metformin Hcl 1000 Mg Tabs (Metformin hcl) .Marland Kitchen... Take 1  tablet by mouth twice a day    Aspirin 81 Mg Tbec (Aspirin) .Marland Kitchen... Take one by mouth once a day  Labs Reviewed: Creat: 0.8 (04/14/2010)     Last Eye Exam: normal (12/07/2009) Reviewed HgBA1c results: 6.1 (04/14/2010)  7.3 (10/16/2009)  Complete Medication List: 1)  Ramipril 10 Mg Caps (Ramipril) .Marland Kitchen.. 1 tab daily 2)  Lantus Solostar 100 Unit/ml Soln (Insulin  glargine) .Marland Kitchen.. 10 units daily. 3)  Glipizide 5 Mg Tb24 (Glipizide) .... Take 1 tablet by mouth once a day 4)  Metformin Hcl 1000 Mg Tabs (Metformin hcl) .... Take 1 tablet by mouth twice a day 5)  Simvastatin 20 Mg Tabs (Simvastatin) .... Take 1 tablet by mouth at bedtime 6)  Protonix 40 Mg Tbec (Pantoprazole sodium) .... Take 1 tablet by mouth once a day 7)  Vicodin 5-500 Mg Tabs (Hydrocodone-acetaminophen) .Marland Kitchen.. 1 tab every 6 hours as needed for pain. 8)  Colcrys 0.6 Mg Tabs (Colchicine) .... 2 tabs po  at the first sign of flare, followed in 1 hour with a 1 tab by mouth , then 1 tab by mouth bid 9)  Ferrous Sulfate 324 Mg Tbec (Ferrous sulfate) .Marland Kitchen.. 1 tab by mouth daily 10)  Aspirin 81 Mg Tbec (Aspirin) .... Take one by mouth once a day 11)  Bd U/f Short Pen Needle 31g X 8 Mm Misc (Insulin pen needle) .... Use as directed   icd-9 code 250.00 12)  Multivitamins Tabs (Multiple vitamin) .... Take one by mouth once a day  Other Orders: Flu Vaccine 3yrs + MEDICARE PATIENTS (G9562) Administration Flu vaccine - MCR (Z3086)  Patient Instructions: 1)  Please schedule a follow-up appointment in 6 months .  2)  Please take cetirizine 10mg  daily during your allergy season. If this is not enough to control your symptoms, you can add loratadine 10mg  1-2 tabs daily Prescriptions: RAMIPRIL 10 MG CAPS (RAMIPRIL) 1 tab daily  #90 x 3   Entered by:   Mervin Hack CMA (AAMA)   Authorized by:   Cindee Salt MD   Signed by:   Mervin Hack CMA (AAMA) on 05/12/2010   Method used:   Electronically to        [facility]* (retail)             ,          Ph: [phone]       Fax: [phone]   RxID:   [id number]   Current Allergies (reviewed today): * SULFA (SULFONAMIDES) GROUP   Flu Vaccine Consent Questions     Do you have a history of severe allergic reactions to this vaccine? no    Any prior history of allergic reactions to egg and/or gelatin? no    Do you have a sensitivity  to the preservative Thimersol? no    Do you have a past history of Guillan-Barre Syndrome? no    Do you currently have an acute febrile illness? no    Have you ever had a severe reaction to latex? no    Vaccine information given and explained to patient? yes    Are you currently pregnant? no    Lot Number:[id number]   Exp Date:02/05/2011   Site Given  Left Deltoid IMflu

## 2010-09-09 NOTE — Progress Notes (Signed)
Summary: has not passed cap camera yet.  Phone Note Call from Patient Call back at Home Phone [phone]   Caller: Patient Call For: Dr Marina Goodell Reason for Call: Talk to Nurse Summary of Call: Patient had a Cap Egd on Friday and was told to call if she did not see it and she's calling because she has not pass the camera yet. Initial call taken by: Tawni Levy,  August 30, 2010 9:22 AM  Follow-up for Phone Call        Pt. says she has not seen capsule and stools have been fairly loose. Follow-up by: Teryl Lucy RN,  August 30, 2010 11:39 AM  Additional Follow-up for Phone Call Additional follow up Details #1::        set her up for plain films of the abdomen "rule out retained capsule endoscope" Additional Follow-up by: Hilarie Fredrickson MD,  August 30, 2010 11:43 AM  New Problems: FOREIGN BODY IN DIGESTIVE SYSTEM UNSPECIFIED (ICD-938)   Additional Follow-up for Phone Call Additional follow up Details #2::    Pt. ntfd. about x-ray she will come for it tomorrow.Will call back if passes capsule before then. Follow-up by: Teryl Lucy RN,  August 30, 2010 1:23 PM  New Problems: FOREIGN BODY IN DIGESTIVE SYSTEM UNSPECIFIED (ICD-938)

## 2010-09-09 NOTE — Progress Notes (Signed)
Summary: Red sclera post procedure  Phone Note Call from Patient Call back at Home Phone [phone]   Caller: Patient Call For: Dr. Marina Goodell Reason for Call: Talk to Nurse Summary of Call: Since having the COL her left eye has been blood shot. Wants to know if she should be concerned Initial call taken by: Karna Christmas,  August 18, 2010 10:40 AM  Follow-up for Phone Call        Pt had procedure on 08/16/10 and when she awoke on 08/17/10  the sclera in the corner of her  left eye was red which has not resolved.Asking if it was related to the procedure. Told it was not caused as a result of the procedure and will resolve on it's own but that I will consult Dr.Kylia Grajales andl call her back.Pt. also informed that I will be scheduling her for capsule endoscopy next week. Follow-up by: Teryl Lucy RN,  August 18, 2010 11:13 AM  Additional Follow-up for Phone Call Additional follow up Details #1::        Agree... see PCP for eye Additional Follow-up by: Hilarie Fredrickson MD,  August 18, 2010 11:46 AM    Additional Follow-up for Phone Call Additional follow up Details #2::    Pt. will see p.c.p. re :eye.Will come for capsule instructions in the p.m.Marland Kitchen to be done by R.N.Notify Regina. Follow-up by: Teryl Lucy RN,  August 18, 2010 4:11 PM   Appended Document: Red sclera post procedure    Clinical Lists Changes  Orders: Added new Test order of Capsule Endoscopy (Capsule Endoscopy) - Signed Added new Test order of Capsule Endoscopy (Capsule Endoscopy) - Signed

## 2010-09-09 NOTE — Letter (Addendum)
Summary: Patient [facility] Biopsy Results   Gastroenterology  [address] [place], [place] [address]   Phone: [phone]  Fax: [phone]        August 23, 2010 MRN: [id number]    Angelica Murphy [address] [place], [place]  [address]    Dear Ms. Nicolaou,  I am pleased to inform you that the biopsies taken during your recent endoscopic examination did not show any significant abnormality.  Additional information/recommendations:   __ Please call [phone] to schedule a return visit to review      your condition after the CAPSULE ENDOSCOPY  is complete..  __ Continue with the treatment plan as outlined on the day of your      exam.    Please call us if you are having persistent problems or have questions about your condition that have not been fully answered at this time.  Sincerely,  Hilarie Fredrickson MD  This letter has been electronically signed by your physician.  Appended Document: Patient Notice-Endo Biopsy Results Letter Mailed

## 2010-09-09 NOTE — Procedures (Signed)
Summary: Colon/[facility]  Colon/[facility] [place]   Imported By: Lester Englewood 08/16/2010 07:17:34  _____________________________________________________________________  External Attachment:    Type:   Image     Comment:   External Document

## 2010-09-14 ENCOUNTER — Ambulatory Visit: Payer: Self-pay | Admitting: Internal Medicine

## 2010-09-14 DIAGNOSIS — Z0289 Encounter for other administrative examinations: Secondary | ICD-10-CM

## 2010-09-20 ENCOUNTER — Ambulatory Visit
Admission: RE | Admit: 2010-09-20 | Discharge: 2010-09-20 | Disposition: A | Discharge status: Home / Self Care | Payer: 59 | Source: Ambulatory Visit | Attending: Internal Medicine | Admitting: Internal Medicine

## 2010-09-20 ENCOUNTER — Encounter: Payer: Self-pay | Admitting: Internal Medicine

## 2010-09-20 DIAGNOSIS — Z1239 Encounter for other screening for malignant neoplasm of breast: Secondary | ICD-10-CM

## 2010-09-22 ENCOUNTER — Encounter: Payer: Self-pay | Admitting: Internal Medicine

## 2010-09-29 NOTE — Letter (Signed)
Summary: Results Follow up Letter  Angelica Murphy at [facility]  [address] [place], [place] [address]   Phone: [phone]  Fax: [phone]    09/20/2010 MRN: [id number]  Angelica Murphy [address] [place], [place]  [address]  Dear Ms. Conaty,  The following are the results of your recent test(s):  Test         Result    Pap Smear:        Normal _____  Not Normal _____ Comments: ______________________________________________________ Cholesterol: LDL(Bad cholesterol):         Your goal is less than:         HDL (Good cholesterol):       Your goal is more than: Comments:  ______________________________________________________ Mammogram:        Normal __X___  Not Normal _____ Comments:Mammo is fine repeat recommended in 1-2 years __________________________________________________________________ Hemoccult:        Normal _____  Not normal _______ Comments:    _____________________________________________________________________ Other Tests:    We routinely do not discuss normal results over the telephone.  If you desire a copy of the results, or you have any questions about this information we can discuss them at your next office visit.   Sincerely,      Angelica Murphy

## 2010-09-30 ENCOUNTER — Encounter: Payer: Self-pay | Admitting: Internal Medicine

## 2010-09-30 ENCOUNTER — Institutional Professional Consult (permissible substitution) (INDEPENDENT_AMBULATORY_CARE_PROVIDER_SITE_OTHER): Payer: Medicare Other | Admitting: Internal Medicine

## 2010-09-30 DIAGNOSIS — I10 Essential (primary) hypertension: Secondary | ICD-10-CM

## 2010-09-30 DIAGNOSIS — I479 Paroxysmal tachycardia, unspecified: Secondary | ICD-10-CM

## 2010-10-01 ENCOUNTER — Encounter: Payer: Self-pay | Admitting: Internal Medicine

## 2010-10-01 ENCOUNTER — Encounter (INDEPENDENT_AMBULATORY_CARE_PROVIDER_SITE_OTHER): Payer: Medicare Other

## 2010-10-01 ENCOUNTER — Other Ambulatory Visit (INDEPENDENT_AMBULATORY_CARE_PROVIDER_SITE_OTHER): Payer: Medicare Other

## 2010-10-01 DIAGNOSIS — I479 Paroxysmal tachycardia, unspecified: Secondary | ICD-10-CM

## 2010-10-04 ENCOUNTER — Telehealth: Payer: Self-pay | Admitting: Internal Medicine

## 2010-10-05 NOTE — Assessment & Plan Note (Signed)
Summary: per [name] ?SVT/kl   Primary Provider:  Tillman Abide, MD   History of Present Illness: Angelica Murphy is referred today for "PAT vs atrial fib with an RVR". The patient has a h/o allergies and was at Dr. Blossom Hoops office when she was found to have an increased heart rate. She has a h/o HTN, DM and dyslipidemia. She has not had syncope. She was recently found to be anemic and has undergone EGD/colon/pill endoscopy.  No obvious etiology.  The patient notes that she has had some increased anxiety. No fever/chills/loss of hair/weight change or other evidence of an endocrine problem.   Current Medications (verified): 1)  Ramipril 10 Mg Caps (Ramipril) .Marland Kitchen.. 1 Tab Daily 2)  Lantus Solostar 100 Unit/ml  Soln (Insulin Glargine) .Marland Kitchen.. 10 Units Daily. 3)  Glipizide 5 Mg Tb24 (Glipizide) .... Take 1 Tablet By Mouth Once A Day 4)  Metformin Hcl 1000 Mg Tabs (Metformin Hcl) .... Take 1 Tablet By Mouth Twice A Day 5)  Simvastatin 20 Mg Tabs (Simvastatin) .... Take 1 Tablet By Mouth At Bedtime 6)  Protonix 40 Mg Tbec (Pantoprazole Sodium) .... Take 1 Tablet By Mouth Once A Day 7)  Colcrys 0.6 Mg Tabs (Colchicine) .... 2 Tabs Po  At The First Sign of Flare, Followed in 1 Hour With A 1 Tab By Mouth , Then 1 Tab By Mouth Two Times A Day 8)  Aspirin 81 Mg  Tbec (Aspirin) .... Take One By Mouth Once A Day 9)  Bd U/f Short Pen Needle 31g X 8 Mm Misc (Insulin Pen Needle) .... Use As Directed   Icd-9 Code 250.00 10)  Multivitamins   Tabs (Multiple Vitamin) .... Take One By Mouth Once A Day  Allergies: 1)  * Sulfa (Sulfonamides) Group  Past History:  Past Medical History: Last updated: 08/10/2010 Allergic rhinitis Diabetes mellitus, type II Diverticulosis, colon GERD Hyperlipidemia Hypertension colon polyps Gout Hemorrhoids  Past Surgical History: Last updated: 04/05/2007 Hysterectomy 1972 Vaginal deliveries  x 2,  C-section x 1 Echo - mild LVH 10/'04 Left breast lump - benign ([facility])  02/07 Right breast bx - benign 02/08  Family History: Last updated: 18-Dec-2007 Father: Died at age 80, colon cancer Mother: Died at age 25, renal failure Siblings: One brother deceased at age 37, one brother deceased at age 63 lung cancer.  One sister deceased at age 26 CVA; one sister living with cerebral aneurysm.  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  Vital Signs:  Patient profile:   75 year old female Height:      62 inches Weight:      142 pounds BMI:     26.07 Pulse rate:   105 / minute Pulse rhythm:   irregular BP sitting:   160 / 80  (left arm)  Vitals Entered By: Laurance Flatten CMA (September 30, 2010 5:01 PM)  Physical Exam  General:  Well developed, well nourished, no acute distress. Head:  Normocephalic and atraumatic. Eyes:  PERRLA, no icterus. Conjunctiva pale Mouth:  No deformity or lesions. Neck:  Supple; no masses or thyromegaly. Lungs:  Clear throughout to auscultation with no wheezes, rales, or rhonchi Heart:  Irregular tachycardia with normal S1 and S2. PMI is not enlarged or laterally displaced. Abdomen:  Soft, nontender and  nondistended. No masses, hepatosplenomegaly or hernias noted. Normal bowel sounds. Msk:  Symmetrical with no gross deformities. Normal posture. Pulses:  Normal pulses noted. Extremities:  No clubbing or cyanosis. Neurologic:  Alert and oriented x 3.   EKG  Procedure date:  09/30/2010  Findings:      Sinus tachycardia with rate of: 106. PVC's noted.    Impression & Recommendations:  Problem # 1:  UNSPECIFIED PAROXYSMAL TACHYCARDIA (ICD-427.2) Her increased heart rate appears to be due to sinus tachycardia. The etiology is unclear. Usually these are secondary to a metabolic or hormonal problem.  I have carefully reviewed her history. Even if she has an occult bleed in her GI tract, it would be unlikely that this would cause her to have sinus tachy unless she was severely anemic. She has not had a thyroid check in 6 months and will repeat this along with a BMP, and an ESR. As she looks well, I have asked her to increase her physical exercise as this is one way to improve symptoms in patient's who otherwise have sinus tachycardia. I have asked her to wear a 24 hour holter to better characterize her heart rates. Her updated medication list for this problem includes:    Ramipril 10 Mg Caps (Ramipril) .Marland Kitchen... 1 tab daily    Aspirin 81 Mg Tbec (Aspirin) .Marland Kitchen... Take one by mouth once a day  Orders: Holter Monitor (Holter Monitor)  Problem # 2:  ANEMIA-IRON DEFICIENCY (ICD-280.9) She will followup with GI.  Problem # 3:  HYPERTENSION (ICD-401.9) Her blood pressure is elevated. If no other explanation for her increased heart rates is found, would start metoprolol Her updated medication list for this problem includes:    Ramipril 10 Mg Caps (Ramipril) .Marland Kitchen... 1 tab daily    Aspirin 81 Mg Tbec (Aspirin) .Marland Kitchen... Take one by mouth once a day  Patient Instructions: 1)  Your physician recommends that you return for lab work tomorrow with Dr Alphonsus Sias TSH, CBC,and a Sed Rate 427.2  2)  Your physician has recommended that you wear a holter monitor.  Holter monitors are medical devices that record the heart's electrical activity. Doctors most often use these monitors to diagnose arrhythmias. Arrhythmias are problems with the speed or rhythm of the heartbeat. The monitor is a small, portable device. You can wear one while you do your normal daily activities. This is usually used to diagnose what is causing palpitations/syncope (passing out). 3)  Your physician recommends that you schedule a follow-up appointment in: 6 weeks with Dr Ladona Ridgel

## 2010-10-06 LAB — CONVERTED CEMR LAB
Eosinophils Absolute: 0.1 10*3/uL (ref 0.0–0.7)
Eosinophils Relative: 1 % (ref 0–5)
HCT: 25.9 % — ABNORMAL LOW (ref 36.0–46.0)
Hemoglobin: 7 g/dL — ABNORMAL LOW (ref 12.0–15.0)
Lymphs Abs: 2.1 10*3/uL (ref 0.7–4.0)
MCV: 67.6 fL — ABNORMAL LOW (ref 78.0–100.0)
Monocytes Absolute: 0.7 10*3/uL (ref 0.1–1.0)
Monocytes Relative: 9 % (ref 3–12)
RBC: 3.83 M/uL — ABNORMAL LOW (ref 3.87–5.11)
TSH: 4.92 microintl units/mL — ABNORMAL HIGH (ref 0.350–4.500)
WBC: 8.2 10*3/uL (ref 4.0–10.5)

## 2010-10-12 ENCOUNTER — Ambulatory Visit: Payer: Self-pay | Admitting: Internal Medicine

## 2010-10-14 NOTE — Progress Notes (Signed)
Summary: CRITICAL LAB  Phone Note From Other Clinic   Caller: Pam @ [address] office [phone] Call For: Dr.Tamotsu Wiederholt Summary of Call: calling to alert Dr.Icel Castles that pt's Hgb is 7.0 and wanted to know how we wanted to handle this? pt's results were sent to [facility] and Dr.Perry, per Pam pt needs CBC, should we schedule here at [facility]? please advise. labs are on your desktop Initial call taken by: Mervin Hack CMA Duncan Dull),  October 04, 2010 2:16 PM  Follow-up for Phone Call        reviewed with patient No acute symptoms now Dr Marina Goodell aware and is planning next step Follow-up by: Cindee Salt MD,  October 04, 2010 2:59 PM

## 2010-10-14 NOTE — Procedures (Signed)
Summary: CAPSULE ENDOSCOPY REPORT   ORDERED BY: Yancey Flemings, MD READ BY: Yancey Flemings, MD  REASON FOR REFERRAL: 75 YO FEMALE WITH FE DEFICIENCY ANEMIA. R/O SMALL BOWEL LESION OR AVM'S.  PATIENT DATA: Height: 62.0 inches. Weight: 146lbs. Waist: 0.0 inches. Build: Normal. Gastric Passage Time: 0h 72m. Small Bowel Passage Time: 3h 54m.  PROCEDURE INFO & FINDINGS: 1) COMPLETE STUDY, GOOD PREP 2)NEGATIVE STUDY WITH NO FINDINGS TO EXPLAIN ANEMIA.  SUMMARY & RECOMMENDATIONS: 1.STAY ON ORAL IRON 2. FOLLOW UP OFFICE VISIT AND CBC WITH DR. Jeanean Hollett.

## 2010-10-19 NOTE — Consult Note (Signed)
Summary: [facility]  [facility]   Imported By: Lanelle Bal 10/11/2010 13:13:29  _____________________________________________________________________  External Attachment:    Type:   Image     Comment:   External Document  Appended Document: [facility] continues immunotherapy sent to EP due to tachycardia

## 2010-11-03 ENCOUNTER — Other Ambulatory Visit: Payer: Self-pay | Admitting: *Deleted

## 2010-11-03 ENCOUNTER — Other Ambulatory Visit (INDEPENDENT_AMBULATORY_CARE_PROVIDER_SITE_OTHER): Payer: Medicare Other

## 2010-11-03 DIAGNOSIS — D509 Iron deficiency anemia, unspecified: Secondary | ICD-10-CM

## 2010-11-03 LAB — CBC WITH DIFFERENTIAL/PLATELET
Basophils Absolute: 0 10*3/uL (ref 0.0–0.1)
HCT: 32 % — ABNORMAL LOW (ref 36.0–46.0)
Hemoglobin: 10 g/dL — ABNORMAL LOW (ref 12.0–15.0)
Lymphs Abs: 1.9 10*3/uL (ref 0.7–4.0)
MCHC: 31.1 g/dL (ref 30.0–36.0)
Monocytes Relative: 6.8 % (ref 3.0–12.0)
Neutro Abs: 5.7 10*3/uL (ref 1.4–7.7)
RDW: 33 % — ABNORMAL HIGH (ref 11.5–14.6)

## 2010-11-04 ENCOUNTER — Telehealth: Payer: Self-pay

## 2010-11-04 NOTE — Telephone Encounter (Signed)
Spoke with pt and she is aware of Dr. Perry's recommendations. 

## 2010-11-04 NOTE — Telephone Encounter (Signed)
Message copied by Chrystie Nose on Thu Nov 04, 2010 11:51 AM ------      Message from: Yancey Flemings      Created: Wed Nov 03, 2010  3:57 PM       The patient knows that her hemoglobin is better (now 10). She needs to stay on iron every day, 2 or 3 times daily. Keep her followup appointment with me in April.

## 2010-11-15 ENCOUNTER — Encounter: Payer: Self-pay | Admitting: Internal Medicine

## 2010-11-15 ENCOUNTER — Ambulatory Visit (INDEPENDENT_AMBULATORY_CARE_PROVIDER_SITE_OTHER): Payer: Medicare Other | Admitting: Internal Medicine

## 2010-11-15 VITALS — BP 150/80 | HR 72 | Temp 98.6°F | Ht 62.0 in | Wt 140.0 lb

## 2010-11-15 DIAGNOSIS — I10 Essential (primary) hypertension: Secondary | ICD-10-CM

## 2010-11-15 DIAGNOSIS — E119 Type 2 diabetes mellitus without complications: Secondary | ICD-10-CM

## 2010-11-15 DIAGNOSIS — D649 Anemia, unspecified: Secondary | ICD-10-CM

## 2010-11-15 DIAGNOSIS — K219 Gastro-esophageal reflux disease without esophagitis: Secondary | ICD-10-CM

## 2010-11-15 DIAGNOSIS — E785 Hyperlipidemia, unspecified: Secondary | ICD-10-CM

## 2010-11-15 LAB — BASIC METABOLIC PANEL
BUN: 9 mg/dL (ref 6–23)
CO2: 29 mEq/L (ref 19–32)
Calcium: 9.3 mg/dL (ref 8.4–10.5)
Glucose, Bld: 88 mg/dL (ref 70–99)
Potassium: 4.4 mEq/L (ref 3.5–5.1)
Sodium: 136 mEq/L (ref 135–145)

## 2010-11-15 LAB — HEPATIC FUNCTION PANEL
AST: 63 U/L — ABNORMAL HIGH (ref 0–37)
Albumin: 3.8 g/dL (ref 3.5–5.2)
Alkaline Phosphatase: 70 U/L (ref 39–117)
Total Protein: 6.8 g/dL (ref 6.0–8.3)

## 2010-11-15 LAB — LIPID PANEL: Cholesterol: 138 mg/dL (ref 0–200)

## 2010-11-15 MED ORDER — RAMIPRIL 10 MG PO CAPS: 10.0000 mg | ORAL_CAPSULE | Freq: Every day | ORAL | Status: DC

## 2010-11-15 MED ORDER — PANTOPRAZOLE SODIUM 40 MG PO TBEC: 40.0000 mg | DELAYED_RELEASE_TABLET | Freq: Every day | ORAL | Status: DC

## 2010-11-15 MED ORDER — METFORMIN HCL 1000 MG PO TABS: 1000.0000 mg | ORAL_TABLET | Freq: Two times a day (BID) | ORAL | Status: DC

## 2010-11-15 MED ORDER — GLIPIZIDE ER 5 MG PO TB24: 5.0000 mg | ORAL_TABLET | Freq: Every day | ORAL | Status: DC

## 2010-11-15 MED ORDER — SIMVASTATIN 20 MG PO TABS: 20.0000 mg | ORAL_TABLET | Freq: Every day | ORAL | Status: DC

## 2010-11-15 NOTE — Progress Notes (Signed)
  Subjective:    Patient ID: Angelica Murphy, female    DOB: 22-Aug-1935, 75 y.o.   MRN: [id number]  HPI No clear cut bleeding Still on iron tid---has just had it reduced to bid Hgb up to 10 now Dr Marina Goodell following  Had some fast heart rate--Dr Jethro Bolus sent her to cardiologist Sinus tachycardia---related to anemia Still feels her heart go fast at times Hasn't gotten back to walking yet---has stiffness and soreness in legs still Energy level still low  Diabetes has been good Checks bid--still on 10 units of lantus Usually 120s in AM, and 140 in evening No hypoglycemic reactions  Occ chest pain--gets achy sensation substernal Occ heartburn--this is different Usually at rest Gets DOE just walking around her house. Exercise tolerance has been improving  Past Medical History  Diagnosis Date  . Allergic rhinitis   . Diabetes mellitus     Type II  . Diverticulosis of colon   . GERD (gastroesophageal reflux disease)   . Hyperlipidemia   . Hypertension   . Hx of colonic polyps   . Gout   . Hemorrhoids   . Family history of colon cancer father    Past Surgical History  Procedure Date  . Abdominal hysterectomy 1972  . Cesarean section   . Breast lumpectomy 02/07    Left  (Young)  Benign  . Breast biopsy 02/08    Benign     Family History  Problem Relation Age of Onset  . Kidney disease Mother   . Cancer Father     Colon  . Heart disease Sister     CVA  . Cancer Brother     Lung    History   Social History  . Marital Status: Married    Spouse Name: N/A    Number of Children: 3  . Years of Education: N/A   Occupational History  . 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 Smoking status: Never Smoker   . Smokeless tobacco: Not on file  . Alcohol Use: No  . Drug Use:   . Sexually Active:    Other Topics Concern  . Not on file   Social History Narrative  . No narrative on file   Review  of Systems occ headaches Appetite is fine Weight is fairly stable Gets feeling of "drawing in her head"--just brief Sleeps okay now--had some troubles until the past 2 weeks     Objective:   Physical Exam  Constitutional: She appears well-developed and well-nourished. No distress.  Neck: Normal range of motion. Neck supple. No thyromegaly present.  Cardiovascular: Normal rate, regular rhythm, normal heart sounds and intact distal pulses.  Exam reveals no gallop.   No murmur heard.      HR 90 occ skips  Pulmonary/Chest: Effort normal and breath sounds normal. No respiratory distress. She has no wheezes. She has no rales.  Abdominal: Soft. She exhibits no mass. There is no tenderness.  Musculoskeletal: Normal range of motion. She exhibits no edema and no tenderness.  Lymphadenopathy:    She has no cervical adenopathy.  Neurological:       Normal fine touch in plantar feet  Skin: No rash noted.       No ulcers  Psychiatric: She has a normal mood and affect. Her behavior is normal. Judgment and thought content normal.          Assessment & Plan:

## 2010-11-17 ENCOUNTER — Ambulatory Visit: Payer: Medicare Other | Admitting: Internal Medicine

## 2010-11-17 ENCOUNTER — Ambulatory Visit (INDEPENDENT_AMBULATORY_CARE_PROVIDER_SITE_OTHER): Payer: Medicare Other | Admitting: Internal Medicine

## 2010-11-17 ENCOUNTER — Encounter: Payer: Self-pay | Admitting: Internal Medicine

## 2010-11-17 ENCOUNTER — Other Ambulatory Visit (INDEPENDENT_AMBULATORY_CARE_PROVIDER_SITE_OTHER): Payer: Medicare Other

## 2010-11-17 ENCOUNTER — Telehealth: Payer: Self-pay

## 2010-11-17 VITALS — BP 154/76 | HR 84 | Ht 63.0 in | Wt 142.6 lb

## 2010-11-17 DIAGNOSIS — I369 Nonrheumatic tricuspid valve disorder, unspecified: Secondary | ICD-10-CM

## 2010-11-17 DIAGNOSIS — E611 Iron deficiency: Secondary | ICD-10-CM

## 2010-11-17 DIAGNOSIS — D509 Iron deficiency anemia, unspecified: Secondary | ICD-10-CM

## 2010-11-17 DIAGNOSIS — I479 Paroxysmal tachycardia, unspecified: Secondary | ICD-10-CM

## 2010-11-17 LAB — CBC WITH DIFFERENTIAL/PLATELET
Basophils Relative: 0.6 % (ref 0.0–3.0)
Eosinophils Relative: 0.9 % (ref 0.0–5.0)
HCT: 32.5 % — ABNORMAL LOW (ref 36.0–46.0)
Lymphs Abs: 1.8 10*3/uL (ref 0.7–4.0)
MCV: 77.7 fl — ABNORMAL LOW (ref 78.0–100.0)
Monocytes Absolute: 0.4 10*3/uL (ref 0.1–1.0)
Neutro Abs: 5.8 10*3/uL (ref 1.4–7.7)
RBC: 4.19 Mil/uL (ref 3.87–5.11)
WBC: 8.1 10*3/uL (ref 4.5–10.5)

## 2010-11-17 MED ORDER — FERROUS SULFATE 325 (65 FE) MG PO TABS: 325.0000 mg | ORAL_TABLET | Freq: Three times a day (TID) | ORAL | Status: DC

## 2010-11-17 NOTE — Telephone Encounter (Signed)
Message copied by Darcey Nora on Wed Nov 17, 2010  3:51 PM ------      Message from: Yancey Flemings      Created: Wed Nov 17, 2010 12:52 PM       Please call patient a letter note that her blood counts are about the same as 2 weeks ago. Ask her to increase her iron to 3 times daily and have her repeat a CBC in 6 weeks

## 2010-11-17 NOTE — Telephone Encounter (Signed)
Patient aware new lab orders entered for 12/29/10

## 2010-11-17 NOTE — Progress Notes (Signed)
See phone note

## 2010-11-17 NOTE — Patient Instructions (Signed)
EKG today  CBC ordered for you to have today.

## 2010-11-17 NOTE — Progress Notes (Signed)
HISTORY OF PRESENT ILLNESS:  Angelica Murphy is a 75 y.o. female with the below listed medical history who presents today for followup regarding iron deficiency anemia. She has been followed in this office for iron deficiency anemia as well as GERD and a history of adenomatous polyps. Most recent office evaluation January 2012 regarding iron deficiency anemia with a hemoglobin of 8.6 and MCV 67.7. She subsequently underwent colonoscopy and upper endoscopy that same month. Colonoscopy revealed a normal terminal ileum, moderate sigmoid diverticulosis, and internal hemorrhoids. Upper endoscopy was normal. Duodenal biopsies revealed nonspecific variable villus architectural changes. A capsule endoscopy was performed January 19. This was unremarkable( a complete study with good preparation). Hemoglobin in February was 7.0. She had not been taking her iron. She was instructed to take aspirin 3 times daily. Most recent hemoglobin last month was 10. She is currently taking iron twice daily. Since her last visit, she was seen by cardiology for irregular heart rate determined to be an unspecified paroxysmal tachycardia. She is due for cardiology followup next week. Her GI review of systems is negative.  REVIEW OF SYSTEMS:  All non-GI ROS negative except for sinus/allergy, anxiety, arthritis, fatigue, heart rhythm change, shortness of breath.  Past Medical History  Diagnosis Date  . Allergic rhinitis   . Diabetes mellitus     Type II  . Diverticulosis of colon   . GERD (gastroesophageal reflux disease)   . Hyperlipidemia   . Hypertension   . Hx of colonic polyps   . Gout   . Hemorrhoids   . Family history of colon cancer father  . Anemia   . Tachycardia, paroxysmal 09-2010    Past Surgical History  Procedure Date  . Abdominal hysterectomy 1972  . Cesarean section   . Breast lumpectomy 02/07    Left  (Young)  Benign  . Breast biopsy 02/08    left- Benign     Social History Angelica Murphy   reports that she has never smoked. She does not have any smokeless tobacco history on file. She reports that she does not drink alcohol or use illicit drugs.  family history includes Breast cancer in an unspecified family member; Colon cancer in her father; Heart disease in her sister; Kidney disease in her mother; and Lung cancer in her brother.  Allergies  Allergen Reactions  . Sulfonamide Derivatives     REACTION: eyes red and blurry       PHYSICAL EXAMINATION:  Vital signs: BP 154/76  Pulse 84  Ht 5\' 3"  (1.6 m)  Wt 142 lb 9.6 oz (64.683 kg)  BMI 25.26 kg/m2 General: Well-developed, well-nourished, no acute distress HEENT: Sclerae are anicteric, conjunctiva pink. Oral mucosa intact Lungs: Clear Heart: irregular Abdomen: soft, nontender, nondistended, no obvious ascites, no peritoneal signs, normal bowel sounds. No organomegaly. Extremities: No edema Psychiatric: alert and oriented x3. Cooperative     ASSESSMENT:  #1. Iron deficiency anemia. No obvious cause found despite extensive GI workup. May have occult AVMs not identified on capsule endoscopy. Seems to be responding to iron therapy.  #2. GERD. Currently asymptomatic on PPI therapy  #3. History of adenomatous polyps. No polyps on most recent colonoscopy  #4. Irregular heart rate. Question atrial fibrillation. Recent cardiology evaluation as noted   PLAN:  #1. Continue iron therapy #2. CBC today #3. EKG today. EKG reveals sinus rhythm with PVCs. Keep cardiology followup appointment next week. Provided the patient with a copy of her EKG #4. Routine GI office followup in 1  year #5. Surveillance colonoscopy around January 2017.

## 2010-11-19 ENCOUNTER — Telehealth: Payer: Self-pay | Admitting: *Deleted

## 2010-11-19 ENCOUNTER — Encounter: Payer: Self-pay | Admitting: Internal Medicine

## 2010-11-19 NOTE — Telephone Encounter (Signed)
Left message asking pt to return my call.

## 2010-11-19 NOTE — Telephone Encounter (Signed)
Message copied by Mervin Hack on Fri Nov 19, 2010  6:01 PM ------      Message from: Tillman Abide      Created: Mon Nov 15, 2010  5:15 PM       Please call      Labs look great      Diabetes control is excellent with A1c down to 5.4%--in the normal range. Please stop the glipizide to prevent low sugar reactions      CHol at goal with total of 138 and LDL or bad chol of 77      Mild elevations of liver tests are not concerning---this is about the same as it was some years ago and only up a little from last time. No changes needed      Kidney tests are normal

## 2010-11-22 NOTE — Telephone Encounter (Signed)
Patient returned my call and I advised results.

## 2010-11-25 ENCOUNTER — Ambulatory Visit (INDEPENDENT_AMBULATORY_CARE_PROVIDER_SITE_OTHER): Payer: Medicare Other | Admitting: Internal Medicine

## 2010-11-25 ENCOUNTER — Encounter: Payer: Self-pay | Admitting: Internal Medicine

## 2010-11-25 VITALS — BP 160/80 | HR 91 | Ht 63.0 in | Wt 141.0 lb

## 2010-11-25 DIAGNOSIS — R000 Tachycardia, unspecified: Secondary | ICD-10-CM

## 2010-11-25 DIAGNOSIS — D509 Iron deficiency anemia, unspecified: Secondary | ICD-10-CM

## 2010-11-25 DIAGNOSIS — I10 Essential (primary) hypertension: Secondary | ICD-10-CM

## 2010-11-25 DIAGNOSIS — I479 Paroxysmal tachycardia, unspecified: Secondary | ICD-10-CM

## 2010-11-25 DIAGNOSIS — I498 Other specified cardiac arrhythmias: Secondary | ICD-10-CM

## 2010-11-25 DIAGNOSIS — M109 Gout, unspecified: Secondary | ICD-10-CM

## 2010-11-25 MED ORDER — NEBIVOLOL HCL 2.5 MG PO TABS: 2.5000 mg | ORAL_TABLET | Freq: Every day | ORAL | Status: DC

## 2010-11-25 NOTE — Patient Instructions (Signed)
Your physician has recommended you make the following change in your medication: Start Bystolic 2.5mg  1 tablet daily. Your physician recommends that you schedule a follow-up appointment in: 3 months with Dr Ladona Ridgel in [place].

## 2010-11-25 NOTE — Assessment & Plan Note (Signed)
Her hemoglobin is improved. She will followup with Dr. Alphonsus Sias.

## 2010-11-25 NOTE — Assessment & Plan Note (Signed)
Her diuretic has been discontinued because of gout. Her blood pressure is elevated. We'll start her on a beta blocker today.

## 2010-11-25 NOTE — Progress Notes (Signed)
HPI [name] returns today for followup. She is a pleasant 75 year old woman with a history of hypertension and palpitations. I saw her several weeks ago after referral by Dr. Adolph Pollack. At that time she was having worsening palpitations. She also complained of weakness and fatigue and was found to have sinus tachycardia. Subsequent evaluation demonstrated that she was anemic with a hemoglobin of 7. Today her hemoglobin is improved and she feels better but still complains of weakness and palpitations. Her diuretic was discontinued. Her blood pressure has been elevated. She has had no syncope. Minimal peripheral edema is present. Allergies  Allergen Reactions  . Sulfonamide Derivatives     REACTION: eyes red and blurry     Current Outpatient Prescriptions  Medication Sig Dispense Refill  . aspirin 81 MG EC tablet Take 81 mg by mouth daily.        . colchicine 0.6 MG tablet Take 2 tablets by mouth at the first sign of flare, followed in 1 hour with 1 tablet by mouth, then 1 tablet by mouth two times a day.       . ferrous sulfate (QC FERROUS SULFATE) 325 (65 FE) MG tablet Take 1 tablet (325 mg total) by mouth 3 (three) times daily with meals.  1 tablet  0  . insulin glargine (LANTUS SOLOSTAR) 100 UNIT/ML injection Inject 10 Units into the skin at bedtime.        . Insulin Pen Needle (B-D ULTRAFINE III SHORT PEN) 31G X 8 MM MISC Use as directed.       . metFORMIN (GLUCOPHAGE) 1000 MG tablet Take 1 tablet (1,000 mg total) by mouth 2 (two) times daily with a meal.  180 tablet  3  . Multiple Vitamin (MULTIVITAMIN) tablet Take 1 tablet by mouth daily.        . pantoprazole (PROTONIX) 40 MG tablet Take 1 tablet (40 mg total) by mouth daily.  90 tablet  3  . ramipril (ALTACE) 10 MG capsule Take 1 capsule (10 mg total) by mouth daily.  90 capsule  3  . simvastatin (ZOCOR) 20 MG tablet Take 1 tablet (20 mg total) by mouth at bedtime.  90 tablet  3  . nebivolol (BYSTOLIC) 2.5 MG tablet Take 1 tablet (2.5  mg total) by mouth daily.  30 tablet  11  . DISCONTD: glipiZIDE (GLUCOTROL) 5 MG 24 hr tablet Take 1 tablet (5 mg total) by mouth daily.  90 tablet  3  . DISCONTD: nebivolol (BYSTOLIC) 2.5 MG tablet Take 1 tablet (2.5 mg total) by mouth daily.  30 tablet  11     Past Medical History  Diagnosis Date  . Allergic rhinitis   . Diabetes mellitus     Type II  . Diverticulosis of colon   . GERD (gastroesophageal reflux disease)   . Hyperlipidemia   . Hypertension   . Hx of colonic polyps   . Gout   . Hemorrhoids   . Family history of colon cancer father  . Anemia   . Tachycardia, paroxysmal 09-2010    ROS:   All systems reviewed and negative except as noted in the HPI.   Past Surgical History  Procedure Date  . Abdominal hysterectomy 1972  . Cesarean section   . Breast lumpectomy 02/07    Left  (Young)  Benign  . Breast biopsy 02/08    left- Benign      Family History  Problem Relation Age of Onset  . Kidney disease Mother   .  Colon cancer Father   . Heart disease Sister     CVA  . Lung cancer Brother   . Breast cancer      neice     History   Social History  . Marital Status: Married    Spouse Name: N/A    Number of Children: 3  . Years of Education: N/A   Occupational History  . 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 Smoking status: Never Smoker   . Smokeless tobacco: Not on file  . Alcohol Use: No  . Drug Use: No  . Sexually Active: Not on file   Other Topics Concern  . Not on file   Social History Narrative  . No narrative on file     BP 160/80  Pulse 91  Ht 5\' 3"  (1.6 m)  Wt 141 lb (63.957 kg)  BMI 24.98 kg/m2  Physical Exam:  Well appearing NAD HEENT: Unremarkable Neck:  No JVD, no thyromegally Lymphatics:  No adenopathy Back:  No CVA tenderness Lungs:  Clear HEART:  Regular rate rhythm. no rubs, no clicks a soft systolic murmur is present Abd:  Flat, positive bowel  sounds, no organomegally, no rebound, no guarding Ext:  2 plus pulses, no edema, no cyanosis, no clubbing Skin:  No rashes no nodules Neuro:  CN II through XII intact, motor grossly intact  EKG  normal sinus rhythm. Occasional premature atrial contractions. LVH is present  Assess/Plan:

## 2010-11-25 NOTE — Assessment & Plan Note (Signed)
So far we can only demonstrates sinus tachycardia with frequent premature atrial contractions. I suspect her resting heart rate has been mostly related to her anemia. We will start a low dose of beta blocker today because of her tachycardia but mostly because of hypertension.

## 2010-11-25 NOTE — Assessment & Plan Note (Signed)
Her diuretic has been discontinued. She will keep from eating fatty meats.

## 2010-12-14 ENCOUNTER — Other Ambulatory Visit: Payer: Self-pay | Admitting: *Deleted

## 2010-12-14 MED ORDER — INSULIN PEN NEEDLE 31G X 8 MM MISC: Status: DC

## 2010-12-14 NOTE — Telephone Encounter (Signed)
Ok to refill? Please advise if instructions are correct, rx on your desk.

## 2010-12-15 ENCOUNTER — Other Ambulatory Visit: Payer: Self-pay | Admitting: *Deleted

## 2010-12-15 MED ORDER — COLCHICINE 0.6 MG PO TABS: 0.6000 mg | ORAL_TABLET | Freq: Two times a day (BID) | ORAL | Status: DC | PRN

## 2010-12-15 MED ORDER — COLCHICINE 0.6 MG PO TABS: 0.6000 mg | ORAL_TABLET | Freq: Two times a day (BID) | ORAL | Status: DC

## 2010-12-15 NOTE — Telephone Encounter (Signed)
Please see instructions and adjust in chart Okay #60 x 1

## 2010-12-15 NOTE — Telephone Encounter (Signed)
rx corrected and sent to pharmacy

## 2010-12-24 NOTE — Op Note (Signed)
NAME:  ANNLOUISE, GERETY             ACCOUNT NO.:  [id number]   MEDICAL RECORD NO.:  [id number]          PATIENT TYPE:  AMB   LOCATION:  [facility]                          FACILITY:  [facility]   PHYSICIAN:  Rose Phi. Maple Hudson, M.D.   DATE OF BIRTH:  24-Jul-1936   DATE OF PROCEDURE:  10/10/2005  DATE OF DISCHARGE:                                 OPERATIVE REPORT   PREOPERATIVE DIAGNOSIS:  Intraductal papilloma of the left breast.   POSTOPERATIVE DIAGNOSIS:  Intraductal papilloma of the left breast.   OPERATION PERFORMED:  Excision of duct system of left breast.   SURGEON:  Rose Phi. Maple Hudson, M.D.   ANESTHESIA:  MAC.   DESCRIPTION OF PROCEDURE:  This patient had presented with a discharge  coming from the 3 to 4 o'clock position of her left breast.  A ductogram had  failed, but on an ultrasound of this area, a little thickened area of tissue  was identified and she was scheduled for excision.   The patient was placed on the operating table with the arms extended on the  arm board and the left breast prepped and draped in the usual fashion.  The  area of the nipple discharge came from right at the 3:30 to 4:00 position.  A circumareolar incision centered on that area was then outlined with the  marking pencil and the area thoroughly infiltrated with a local anesthetic  mixture.   The incision was made and the areolar flap was dissected and one could then  identify a papilloma in the duct at about 4:00 and I excised this duct  system and the surrounding tissue.  Hemostasis was obtained with the  cautery.  Subcuticular closure with 4-0 Monocryl and Steri-Strips carried  out.  Dressing applied.  The patient was then transferred to the recovery  room in satisfactory condition having tolerated the procedure well.      Rose Phi. Maple Hudson, M.D.  Electronically Signed     PRY/MEDQ  D:  10/10/2005  T:  10/11/2005  Job:  [id number]

## 2010-12-28 ENCOUNTER — Telehealth: Payer: Self-pay | Admitting: Internal Medicine

## 2010-12-28 NOTE — Telephone Encounter (Signed)
Pt knows to come for lab work, states she will come tomorrow.

## 2010-12-29 ENCOUNTER — Other Ambulatory Visit (INDEPENDENT_AMBULATORY_CARE_PROVIDER_SITE_OTHER): Payer: Medicare Other

## 2010-12-29 ENCOUNTER — Telehealth: Payer: Self-pay

## 2010-12-29 DIAGNOSIS — D649 Anemia, unspecified: Secondary | ICD-10-CM

## 2010-12-29 DIAGNOSIS — E611 Iron deficiency: Secondary | ICD-10-CM

## 2010-12-29 LAB — CBC WITH DIFFERENTIAL/PLATELET
Basophils Absolute: 0 10*3/uL (ref 0.0–0.1)
Basophils Relative: 0.2 % (ref 0.0–3.0)
Eosinophils Absolute: 0.1 10*3/uL (ref 0.0–0.7)
Eosinophils Relative: 1 % (ref 0.0–5.0)
HCT: 36.8 % (ref 36.0–46.0)
Hemoglobin: 12.1 g/dL (ref 12.0–15.0)
Lymphocytes Relative: 25.3 % (ref 12.0–46.0)
Lymphs Abs: 1.9 10*3/uL (ref 0.7–4.0)
MCHC: 32.9 g/dL (ref 30.0–36.0)
MCV: 82.4 fl (ref 78.0–100.0)
Monocytes Absolute: 0.6 10*3/uL (ref 0.1–1.0)
Monocytes Relative: 8 % (ref 3.0–12.0)
Neutro Abs: 4.9 10*3/uL (ref 1.4–7.7)
Neutrophils Relative %: 65.5 % (ref 43.0–77.0)
Platelets: 233 10*3/uL (ref 150.0–400.0)
RBC: 4.47 Mil/uL (ref 3.87–5.11)
RDW: 22.2 % — ABNORMAL HIGH (ref 11.5–14.6)
WBC: 7.5 10*3/uL (ref 4.5–10.5)

## 2010-12-29 NOTE — Telephone Encounter (Signed)
Message copied by Chrystie Nose on Wed Dec 29, 2010  3:54 PM ------      Message from: Angelica Murphy      Created: Wed Dec 29, 2010  2:29 PM       Let patient know that blood counts are much better. Now normal. She needs to stay on her iron and have a repeat cbc in 4 months

## 2010-12-29 NOTE — Telephone Encounter (Signed)
Pt aware of results per Dr. Perry and his recommendations. 

## 2010-12-29 NOTE — Telephone Encounter (Signed)
Message copied by Chrystie Nose on Wed Dec 29, 2010  3:52 PM ------      Message from: Yancey Flemings      Created: Wed Dec 29, 2010  2:29 PM       Let patient know that blood counts are much better. Now normal. She needs to stay on her iron and have a repeat cbc in 4 months

## 2011-02-01 ENCOUNTER — Other Ambulatory Visit: Payer: Self-pay | Admitting: *Deleted

## 2011-02-01 MED ORDER — INSULIN GLARGINE 100 UNIT/ML ~~LOC~~ SOLN: 10.0000 [IU] | Freq: Every day | SUBCUTANEOUS | Status: DC

## 2011-02-18 ENCOUNTER — Encounter: Payer: Self-pay | Admitting: Internal Medicine

## 2011-02-23 ENCOUNTER — Encounter: Payer: Self-pay | Admitting: Internal Medicine

## 2011-02-23 ENCOUNTER — Ambulatory Visit (INDEPENDENT_AMBULATORY_CARE_PROVIDER_SITE_OTHER): Payer: Medicare Other | Admitting: Internal Medicine

## 2011-02-23 DIAGNOSIS — I479 Paroxysmal tachycardia, unspecified: Secondary | ICD-10-CM

## 2011-02-23 DIAGNOSIS — I10 Essential (primary) hypertension: Secondary | ICD-10-CM

## 2011-02-23 NOTE — Progress Notes (Signed)
HPI Mrs. Fessenden returns for followup. She is a pleasant 75 yo woman with a h/o palpitations and sinus tachycardia. She was subsequently found to be severely anemic. She has been placed on iron and her Hgb has improved. She has mild gouty symptoms. She denies c/p, sob , or peripheral edema. No syncope. Allergies  Allergen Reactions  . Sulfonamide Derivatives     REACTION: eyes red and blurry     Current Outpatient Prescriptions  Medication Sig Dispense Refill  . acetaminophen (ARTHRITIS PAIN RELIEF) 650 MG CR tablet Take 650 mg by mouth every 8 (eight) hours as needed.        Marland Kitchen aspirin 81 MG EC tablet Take 81 mg by mouth daily.        . colchicine 0.6 MG tablet Take 1 tablet (0.6 mg total) by mouth 2 (two) times daily as needed. for gout flare  60 tablet  1  . ferrous sulfate (QC FERROUS SULFATE) 325 (65 FE) MG tablet Take 1 tablet (325 mg total) by mouth 3 (three) times daily with meals.  1 tablet  0  . insulin glargine (LANTUS SOLOSTAR) 100 UNIT/ML injection Inject 10 Units into the skin at bedtime.  15 mL  11  . Insulin Pen Needle (B-D ULTRAFINE III SHORT PEN) 31G X 8 MM MISC Use as directed.  100 each  11  . metFORMIN (GLUCOPHAGE) 1000 MG tablet Take 1 tablet (1,000 mg total) by mouth 2 (two) times daily with a meal.  180 tablet  3  . Multiple Vitamin (MULTIVITAMIN) tablet Take 1 tablet by mouth daily.        . nebivolol (BYSTOLIC) 2.5 MG tablet Take 1 tablet (2.5 mg total) by mouth daily.  30 tablet  11  . pantoprazole (PROTONIX) 40 MG tablet Take 1 tablet (40 mg total) by mouth daily.  90 tablet  3  . ramipril (ALTACE) 10 MG capsule Take 1 capsule (10 mg total) by mouth daily.  90 capsule  3  . simvastatin (ZOCOR) 20 MG tablet Take 1 tablet (20 mg total) by mouth at bedtime.  90 tablet  3     Past Medical History  Diagnosis Date  . Allergic rhinitis   . Diabetes mellitus     Type II  . Diverticulosis of colon   . GERD (gastroesophageal reflux disease)   . Hyperlipidemia   .  Hypertension   . Hx of colonic polyps   . Gout   . Hemorrhoids   . Family history of colon cancer father  . Anemia   . Tachycardia, paroxysmal 09-2010    ROS:   All systems reviewed and negative except as noted in the HPI.   Past Surgical History  Procedure Date  . Abdominal hysterectomy 1972  . Cesarean section   . Breast lumpectomy 02/07    Left  (Young)  Benign  . Breast biopsy 02/08    left- Benign      Family History  Problem Relation Age of Onset  . Kidney disease Mother   . Colon cancer Father   . Heart disease Sister     CVA  . Lung cancer Brother   . Breast cancer      neice     History   Social History  . Marital Status: Married    Spouse Name: N/A    Number of Children: 3  . Years of Education: N/A   Occupational History  . 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 Smoking status: Never Smoker   . Smokeless tobacco: Not on file  . Alcohol Use: No  . Drug Use: No  . Sexually Active: Not on file   Other Topics Concern  . Not on file   Social History Narrative  . No narrative on file     BP 164/79  Pulse 79  Ht 5\' 3"  (1.6 m)  Wt 141 lb (63.957 kg)  BMI 24.98 kg/m2  Physical Exam:  Well appearing NAD HEENT: Unremarkable Neck:  No JVD, no thyromegally Lymphatics:  No adenopathy Back:  No CVA tenderness Lungs:  Clear with no wheezes, rales, or rhonchi. HEART:  Regular rate rhythm, no murmurs, no rubs, no clicks Abd:  soft, positive bowel sounds, no organomegally, no rebound, no guarding Ext:  2 plus pulses, no edema, no cyanosis, no clubbing Skin:  No rashes no nodules Neuro:  CN II through XII intact, motor grossly intact  EKG NSR with LVH and nonspecific T wave abnormality.   Assess/Plan:

## 2011-02-23 NOTE — Patient Instructions (Signed)
You are doing well. No medication changes were made.  Please call us if you have new issues that need to be addressed before your next appt.    

## 2011-02-23 NOTE — Assessment & Plan Note (Signed)
Her blood pressure is elevated today but she admits to dietary indiscretion. I have asked that she follow her blood pressure. I will refer her back to Dr. Alphonsus Sias. No change in her anti-hypertensive medical therapy.

## 2011-02-23 NOTE — Assessment & Plan Note (Signed)
Her symptoms have resolved with treatment of her anemia. I have recommended a period of watchful waiting.

## 2011-03-21 ENCOUNTER — Telehealth: Payer: Self-pay | Admitting: *Deleted

## 2011-03-21 NOTE — Telephone Encounter (Signed)
Pt is asking what she can take for arthritis in her leg.  She has tried tylenol arthritis, capzasin, asper cream, ibuprofen and nothing helps.  She has taken these according to the instructions.  What else can she do?  Uses [facility] [place].

## 2011-03-21 NOTE — Telephone Encounter (Signed)
Okay to try tramadol 50mg  1/2-1 tid prn  It often will be better with tylenol or other pain relievers, so she should use it only if they don't work #60 x 0

## 2011-03-22 MED ORDER — TRAMADOL HCL 50 MG PO TABS: 25.0000 mg | ORAL_TABLET | Freq: Three times a day (TID) | ORAL | Status: DC | PRN

## 2011-03-22 NOTE — Telephone Encounter (Signed)
rx sent to pharmacy by e-script Spoke with patient and advised results   

## 2011-04-07 ENCOUNTER — Other Ambulatory Visit: Payer: Self-pay | Admitting: *Deleted

## 2011-04-07 MED ORDER — COLCHICINE 0.6 MG PO TABS: 0.6000 mg | ORAL_TABLET | Freq: Two times a day (BID) | ORAL | Status: DC | PRN

## 2011-04-07 NOTE — Telephone Encounter (Signed)
rx sent to pharmacy by e-script  

## 2011-04-15 ENCOUNTER — Other Ambulatory Visit: Payer: Self-pay | Admitting: *Deleted

## 2011-04-15 ENCOUNTER — Telehealth: Payer: Self-pay | Admitting: Internal Medicine

## 2011-04-15 DIAGNOSIS — D649 Anemia, unspecified: Secondary | ICD-10-CM

## 2011-04-18 ENCOUNTER — Other Ambulatory Visit: Payer: Self-pay | Admitting: Internal Medicine

## 2011-04-18 ENCOUNTER — Telehealth: Payer: Self-pay

## 2011-04-18 ENCOUNTER — Other Ambulatory Visit (INDEPENDENT_AMBULATORY_CARE_PROVIDER_SITE_OTHER): Payer: Medicare Other

## 2011-04-18 DIAGNOSIS — D649 Anemia, unspecified: Secondary | ICD-10-CM

## 2011-04-18 LAB — CBC WITH DIFFERENTIAL/PLATELET
Basophils Absolute: 0 10*3/uL (ref 0.0–0.1)
Basophils Relative: 0.2 % (ref 0.0–3.0)
Eosinophils Absolute: 0.1 10*3/uL (ref 0.0–0.7)
HCT: 34.4 % — ABNORMAL LOW (ref 36.0–46.0)
Hemoglobin: 11.1 g/dL — ABNORMAL LOW (ref 12.0–15.0)
Lymphocytes Relative: 18.4 % (ref 12.0–46.0)
Lymphs Abs: 1.3 10*3/uL (ref 0.7–4.0)
MCHC: 32.3 g/dL (ref 30.0–36.0)
Neutro Abs: 5.4 10*3/uL (ref 1.4–7.7)
RBC: 4 Mil/uL (ref 3.87–5.11)
RDW: 19.2 % — ABNORMAL HIGH (ref 11.5–14.6)

## 2011-04-18 NOTE — Telephone Encounter (Signed)
Last refill 03/22/11

## 2011-04-18 NOTE — Telephone Encounter (Signed)
Rx done electronically 

## 2011-04-18 NOTE — Telephone Encounter (Signed)
Pt aware.

## 2011-04-18 NOTE — Telephone Encounter (Signed)
Message copied by Michele Mcalpine on Mon Apr 18, 2011 11:24 AM ------      Message from: Hilarie Fredrickson      Created: Mon Apr 18, 2011  9:47 AM       But patient know that her hemoglobin is lower than last time, but acceptable. Please make sure that she is staying on her iron therapy. Repeat CBC in 4 months

## 2011-04-19 NOTE — Telephone Encounter (Signed)
Lab order has been put in.

## 2011-04-21 ENCOUNTER — Ambulatory Visit (INDEPENDENT_AMBULATORY_CARE_PROVIDER_SITE_OTHER): Payer: Medicare Other | Admitting: Internal Medicine

## 2011-04-21 ENCOUNTER — Encounter: Payer: Self-pay | Admitting: Internal Medicine

## 2011-04-21 VITALS — BP 132/78 | HR 60 | Ht 63.0 in | Wt 137.0 lb

## 2011-04-21 DIAGNOSIS — K219 Gastro-esophageal reflux disease without esophagitis: Secondary | ICD-10-CM

## 2011-04-21 DIAGNOSIS — Z86010 Personal history of colonic polyps: Secondary | ICD-10-CM

## 2011-04-21 DIAGNOSIS — D509 Iron deficiency anemia, unspecified: Secondary | ICD-10-CM

## 2011-04-21 NOTE — Progress Notes (Signed)
HISTORY OF PRESENT ILLNESS:  Angelica Murphy is a 75 y.o. female with the below listed medical history who presents today for followup regarding iron deficiency anemia. She was last seen in April of 2012. She has undergone extensive workup including colonoscopy with ileal intubation, upper endoscopy with duodenal biopsies, and capsule endoscopy. No definitive cause for iron deficiency anemia found. Possibly small intestinal AVMs not identified on capsule study. She has been on oral iron therapy 3 times daily and tolerates this well. She denies any GI complaints. She is feeling well. No fatigue. No evidence of bleeding. Most recent hemoglobin, earlier this week was 11.1.  REVIEW OF SYSTEMS:  All non-GI ROS negative.  Past Medical History  Diagnosis Date  . Allergic rhinitis   . Diabetes mellitus     Type II  . Diverticulosis of colon   . GERD (gastroesophageal reflux disease)   . Hyperlipidemia   . Hypertension   . Hx of colonic polyps   . Gout   . Hemorrhoids   . Family history of colon cancer father  . Anemia   . Tachycardia, paroxysmal 09-2010    Past Surgical History  Procedure Date  . Abdominal hysterectomy 1972  . Cesarean section   . Breast lumpectomy 02/07    Left  (Young)  Benign  . Breast biopsy 02/08    left- Benign     Social History [place]  reports that she has never smoked. She has never used smokeless tobacco. She reports that she does not drink alcohol or use illicit drugs.  family history includes Breast cancer in an unspecified family member; Colon cancer in her father; Heart disease in her sister; Kidney disease in her mother; and Lung cancer in her brother.  Allergies  Allergen Reactions  . Sulfonamide Derivatives     REACTION: eyes red and blurry       PHYSICAL EXAMINATION: Vital signs: BP 132/78  Pulse 60  Ht 5\' 3"  (1.6 m)  Wt 137 lb (62.143 kg)  BMI 24.27 kg/m2 General: Well-developed, well-nourished, no acute distress HEENT:  Sclerae are anicteric, conjunctiva pink. Oral mucosa intact Lungs: Clear Heart: Regular Abdomen: soft, nontender, nondistended, no obvious ascites, no peritoneal signs, normal bowel sounds. No organomegaly. Extremities: No edema Psychiatric: alert and oriented x3. Cooperative    ASSESSMENT:  #1. Iron deficiency anemia. No obvious cause despite extensive GI workup. Possibly small intestinal AVMs not identified on capsule endoscopy. Responding well to oral iron therapy. #2. GERD. Asymptomatic on PPI #3. History of adenomatous colon polyps. No recurrent neoplasia on most recent colonoscopy January 2012   PLAN:  #1. Continue iron therapy #2. CBC in 4 months #3. Routine office followup one year #4. Surveillance colonoscopy around January 2017

## 2011-04-21 NOTE — Patient Instructions (Signed)
Stay on Iron. Follow-up with Dr. Marina Goodell in one year.

## 2011-05-17 ENCOUNTER — Encounter: Payer: Self-pay | Admitting: Internal Medicine

## 2011-05-17 ENCOUNTER — Ambulatory Visit (INDEPENDENT_AMBULATORY_CARE_PROVIDER_SITE_OTHER): Payer: Medicare Other | Admitting: Internal Medicine

## 2011-05-17 VITALS — BP 182/85 | HR 73 | Temp 97.8°F | Ht 63.0 in | Wt 139.0 lb

## 2011-05-17 DIAGNOSIS — Z23 Encounter for immunization: Secondary | ICD-10-CM

## 2011-05-17 DIAGNOSIS — I10 Essential (primary) hypertension: Secondary | ICD-10-CM

## 2011-05-17 DIAGNOSIS — I479 Paroxysmal tachycardia, unspecified: Secondary | ICD-10-CM

## 2011-05-17 DIAGNOSIS — E119 Type 2 diabetes mellitus without complications: Secondary | ICD-10-CM

## 2011-05-17 MED ORDER — BISOPROLOL FUMARATE 5 MG PO TABS: 5.0000 mg | ORAL_TABLET | Freq: Every day | ORAL | Status: DC

## 2011-05-17 NOTE — Assessment & Plan Note (Signed)
BP Readings from Last 3 Encounters:  05/17/11 182/85  04/21/11 132/78  02/23/11 164/79   Has been okay Not sure why elevated Will change the bystolic to bisoprolol and recheck sooner She will check as outpatient

## 2011-05-17 NOTE — Assessment & Plan Note (Signed)
Hopefully still good control---seems to be Lab Results  Component Value Date   HGBA1C 5.4 11/15/2010   Will recheck

## 2011-05-17 NOTE — Patient Instructions (Signed)
Stop the bystolic and start the bisoprolol

## 2011-05-17 NOTE — Progress Notes (Signed)
Subjective:    Patient ID: Angelica Murphy, female    DOB: 02/01/1936, 75 y.o.   MRN: [id number]  HPI Feels like she is coming back to herself Some arthritis pain but not bad  No more fatigue Still on the iron No more palpitations or heart symptoms No chest pain  No SOB  Doesn't generally check [facility]-- 130-140/65-70 No headaches  Checks sugars bid Have been under 120 most of the time No hypoglycemic spells  Stomach fine Still on the PPI  Really happy with the tramadol  Has helped the arthritis tremendously  Current Outpatient Prescriptions on File Prior to Visit  Medication Sig Dispense Refill  . acetaminophen (ARTHRITIS PAIN RELIEF) 650 MG CR tablet Take 650 mg by mouth every 8 (eight) hours as needed.        Marland Kitchen aspirin 81 MG EC tablet Take 81 mg by mouth daily.        . colchicine 0.6 MG tablet Take 1 tablet (0.6 mg total) by mouth 2 (two) times daily as needed. for gout flare  60 tablet  1  . ferrous sulfate (QC FERROUS SULFATE) 325 (65 FE) MG tablet Take 1 tablet (325 mg total) by mouth 3 (three) times daily with meals.  1 tablet  0  . insulin glargine (LANTUS SOLOSTAR) 100 UNIT/ML injection Inject 10 Units into the skin at bedtime.  15 mL  11  . Insulin Pen Needle (B-D ULTRAFINE III SHORT PEN) 31G X 8 MM MISC Use as directed.  100 each  11  . metFORMIN (GLUCOPHAGE) 1000 MG tablet Take 1 tablet (1,000 mg total) by mouth 2 (two) times daily with a meal.  180 tablet  3  . Multiple Vitamin (MULTIVITAMIN) tablet Take 1 tablet by mouth daily.        . nebivolol (BYSTOLIC) 2.5 MG tablet Take 1 tablet (2.5 mg total) by mouth daily.  30 tablet  11  . pantoprazole (PROTONIX) 40 MG tablet Take 1 tablet (40 mg total) by mouth daily.  90 tablet  3  . ramipril (ALTACE) 10 MG capsule Take 1 capsule (10 mg total) by mouth daily.  90 capsule  3  . simvastatin (ZOCOR) 20 MG tablet Take 1 tablet (20 mg total) by mouth at bedtime.  90 tablet  3  . traMADol (ULTRAM) 50 MG tablet  TAKE 1/2 TO 1 TABLET BY MOUTH 3 TIMES DAILY AS NEEDED  60 tablet  0    Allergies  Allergen Reactions  . Sulfonamide Derivatives     REACTION: eyes red and blurry    Past Medical History  Diagnosis Date  . Allergic rhinitis   . Diabetes mellitus     Type II  . Diverticulosis of colon   . GERD (gastroesophageal reflux disease)   . Hyperlipidemia   . Hypertension   . Hx of colonic polyps   . Gout   . Hemorrhoids   . Family history of colon cancer father  . Anemia   . Tachycardia, paroxysmal 09-2010    Past Surgical History  Procedure Date  . Abdominal hysterectomy 1972  . Cesarean section   . Breast lumpectomy 02/07    Left  (Young)  Benign  . Breast biopsy 02/08    left- Benign     Family History  Problem Relation Age of Onset  . Kidney disease Mother   . Colon cancer Father   . Heart disease Sister     CVA  . Lung cancer Brother   .  Breast cancer      neice    History   Social History  . Marital Status: Married    Spouse Name: N/A    Number of Children: 3  . Years of Education: N/A   Occupational History  . 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 Smoking status: Never Smoker   . Smokeless tobacco: Never Used  . Alcohol Use: No  . Drug Use: No  . Sexually Active: Not on file   Other Topics Concern  . Not on file   Social History Narrative  . No narrative on file   Review of Systems Appetite is not that great. Taste is off Weight is stable or down just a little Sleeping okay--some improvement    Objective:   Physical Exam  Constitutional: She appears well-developed and well-nourished. No distress.  Neck: Normal range of motion. Neck supple. No thyromegaly present.  Cardiovascular: Normal rate, regular rhythm, normal heart sounds and intact distal pulses.  Exam reveals no gallop.   No murmur heard. Pulmonary/Chest: Effort normal and breath sounds normal. No respiratory distress.  She has no wheezes. She has no rales.  Musculoskeletal: Normal range of motion. She exhibits no edema and no tenderness.  Lymphadenopathy:    She has no cervical adenopathy.  Skin: No rash noted.       No pedal lesions  Psychiatric: She has a normal mood and affect. Her behavior is normal. Judgment and thought content normal.          Assessment & Plan:

## 2011-05-17 NOTE — Assessment & Plan Note (Signed)
Better now that anemia is better Will continue the beta blocker for BP

## 2011-05-19 ENCOUNTER — Telehealth: Payer: Self-pay | Admitting: *Deleted

## 2011-05-19 NOTE — Telephone Encounter (Signed)
Doubt it is related to the flu shot but possible Make sure she has no symptoms of stroke like weakness, vision loss, speech or swallowing problems. If not, can send Rx for meclizine 25 mg tid prin #60 x 0

## 2011-05-19 NOTE — Telephone Encounter (Signed)
Pt is complaining of dizziness this morning-says the room is spinning.  She got a flu shot yesterday and asks if that could be the cause.  No other symptoms.  I told her we would call her back this afternnoon.

## 2011-05-23 NOTE — Telephone Encounter (Signed)
.  left message to have patient return my call.  

## 2011-05-25 NOTE — Telephone Encounter (Signed)
Patient called back and stated that she feels better now and doesn't think it was the flu shot.

## 2011-05-29 ENCOUNTER — Other Ambulatory Visit: Payer: Self-pay | Admitting: Internal Medicine

## 2011-05-31 ENCOUNTER — Other Ambulatory Visit: Payer: Self-pay | Admitting: Internal Medicine

## 2011-06-01 NOTE — Telephone Encounter (Signed)
Pt c/o of red and blurry eyes after taking ferrous sulfate

## 2011-06-02 NOTE — Telephone Encounter (Signed)
Selinda Michaels, RN refilled rx

## 2011-08-16 ENCOUNTER — Other Ambulatory Visit (INDEPENDENT_AMBULATORY_CARE_PROVIDER_SITE_OTHER): Payer: Medicare Other

## 2011-08-16 DIAGNOSIS — D509 Iron deficiency anemia, unspecified: Secondary | ICD-10-CM

## 2011-08-16 LAB — CBC WITH DIFFERENTIAL/PLATELET
Basophils Absolute: 0 10*3/uL (ref 0.0–0.1)
Basophils Relative: 0.4 % (ref 0.0–3.0)
Eosinophils Relative: 0.6 % (ref 0.0–5.0)
HCT: 30.1 % — ABNORMAL LOW (ref 36.0–46.0)
Lymphocytes Relative: 22.9 % (ref 12.0–46.0)
Lymphs Abs: 1.9 10*3/uL (ref 0.7–4.0)
MCV: 79.3 fl (ref 78.0–100.0)
Monocytes Absolute: 0.5 10*3/uL (ref 0.1–1.0)
RBC: 3.79 Mil/uL — ABNORMAL LOW (ref 3.87–5.11)
WBC: 8.4 10*3/uL (ref 4.5–10.5)

## 2011-08-17 ENCOUNTER — Telehealth: Payer: Self-pay | Admitting: *Deleted

## 2011-08-17 DIAGNOSIS — D649 Anemia, unspecified: Secondary | ICD-10-CM

## 2011-08-17 NOTE — Telephone Encounter (Signed)
Spoke with patient and she says she is taking her iron pills 325 mg TID. She denies any bleeding.

## 2011-08-17 NOTE — Telephone Encounter (Signed)
Message copied by Daphine Deutscher on Wed Aug 17, 2011  3:45 PM ------      Message from: Hilarie Fredrickson      Created: Wed Aug 17, 2011 11:55 AM       Please let patient now that her blood counts are significantly lower than 4 months ago. Currently hemoglobin is 9.6. Is she taking iron daily? If so, how much?

## 2011-08-18 NOTE — Telephone Encounter (Signed)
Referral in EPIC and left a voice mail with referral to [facility] at [phone]. Requested they call to confirm receipt. Called and informed patient of referral also.

## 2011-08-18 NOTE — Telephone Encounter (Signed)
IN THAT CASE, REFER HER TO HEMATOLOGY " ANEMIA REFRACTORY TO IRON"

## 2011-08-19 NOTE — Telephone Encounter (Signed)
Spoke with Tiffany at the [facility] and they have received the referral.

## 2011-08-22 ENCOUNTER — Other Ambulatory Visit: Payer: Self-pay | Admitting: Internal Medicine

## 2011-08-22 DIAGNOSIS — Z1231 Encounter for screening mammogram for malignant neoplasm of breast: Secondary | ICD-10-CM

## 2011-08-23 ENCOUNTER — Telehealth: Payer: Self-pay | Admitting: Hematology and Oncology

## 2011-08-23 ENCOUNTER — Ambulatory Visit (INDEPENDENT_AMBULATORY_CARE_PROVIDER_SITE_OTHER): Payer: Medicare Other | Admitting: Internal Medicine

## 2011-08-23 ENCOUNTER — Encounter: Payer: Self-pay | Admitting: Internal Medicine

## 2011-08-23 ENCOUNTER — Telehealth: Payer: Self-pay

## 2011-08-23 VITALS — BP 158/88 | HR 92 | Temp 97.7°F | Ht 63.0 in | Wt 137.0 lb

## 2011-08-23 DIAGNOSIS — E119 Type 2 diabetes mellitus without complications: Secondary | ICD-10-CM

## 2011-08-23 DIAGNOSIS — E785 Hyperlipidemia, unspecified: Secondary | ICD-10-CM

## 2011-08-23 DIAGNOSIS — I10 Essential (primary) hypertension: Secondary | ICD-10-CM

## 2011-08-23 DIAGNOSIS — M109 Gout, unspecified: Secondary | ICD-10-CM

## 2011-08-23 DIAGNOSIS — Z23 Encounter for immunization: Secondary | ICD-10-CM

## 2011-08-23 DIAGNOSIS — K219 Gastro-esophageal reflux disease without esophagitis: Secondary | ICD-10-CM

## 2011-08-23 LAB — TSH: TSH: 2.99 u[IU]/mL (ref 0.35–5.50)

## 2011-08-23 LAB — BASIC METABOLIC PANEL
BUN: 10 mg/dL (ref 6–23)
Chloride: 102 mEq/L (ref 96–112)
Creatinine, Ser: 0.6 mg/dL (ref 0.4–1.2)
GFR: 126.01 mL/min (ref >60.00)

## 2011-08-23 LAB — HEPATIC FUNCTION PANEL
AST: 22 U/L (ref 0–37)
Albumin: 3.7 g/dL (ref 3.5–5.2)

## 2011-08-23 LAB — MICROALBUMIN / CREATININE URINE RATIO
Creatinine,U: 107.1 mg/dL
Microalb, Ur: 1.1 mg/dL (ref 0.0–1.9)

## 2011-08-23 LAB — LIPID PANEL
LDL Cholesterol: 67 mg/dL (ref 0–99)
VLDL: 22.8 mg/dL (ref 0.0–40.0)

## 2011-08-23 NOTE — Assessment & Plan Note (Signed)
queit lately

## 2011-08-23 NOTE — Assessment & Plan Note (Signed)
AM sugars up some Will increase lantus to 15 and titrate up Check labs

## 2011-08-23 NOTE — Telephone Encounter (Signed)
Pt calling wanting to know the status of her appt at the cancer center. Spoke with pt and let her know the referral was made last week. Pt given the phone number of the cancer center and instructed to ask to speak with the new patient coordinator. Pt verbalized understanding.

## 2011-08-23 NOTE — Assessment & Plan Note (Signed)
BP Readings from Last 3 Encounters:  08/23/11 158/88  05/17/11 182/85  04/21/11 132/78   Forgot meds this AM so dont want to make any changes now

## 2011-08-23 NOTE — Assessment & Plan Note (Signed)
No problems with med Will check labs 

## 2011-08-23 NOTE — Telephone Encounter (Signed)
S/w pt today re appt for 1/22 @ 10 am w/LO.

## 2011-08-23 NOTE — Progress Notes (Signed)
Subjective:    Patient ID: Angelica Murphy, female    DOB: November 20, 1935, 76 y.o.   MRN: [id number]  HPI Has some concerns  Diabetes control has not been as good Checking bid lately High in AM, 180-200. Goes down a little in the day Taking 10 of the lantus only  Hgb down some Has been referred to hematologist for eval of persistent anemia  No chest pain No SOB No palpitations Occ mild ankle swelling---not a big deal  No flares of the gout Mild arthritis symptoms--tramadol really helps Uses tramadol once a day usually  No muscle aching on statin No sig GI symptoms  No falls Feels stable and not concerned about falls  Current Outpatient Prescriptions on File Prior to Visit  Medication Sig Dispense Refill  . acetaminophen (ARTHRITIS PAIN RELIEF) 650 MG CR tablet Take 650 mg by mouth every 8 (eight) hours as needed.        Marland Kitchen aspirin 81 MG EC tablet Take 81 mg by mouth daily.        . bisoprolol (ZEBETA) 5 MG tablet Take 1 tablet (5 mg total) by mouth daily.  30 tablet  12  . colchicine 0.6 MG tablet Take 1 tablet (0.6 mg total) by mouth 2 (two) times daily as needed. for gout flare  60 tablet  1  . ferrous sulfate 325 (65 FE) MG tablet TAKE 1 TABLET BY MOUTH THREE TIMES A DAY  90 tablet  6  . insulin glargine (LANTUS SOLOSTAR) 100 UNIT/ML injection Inject 10 Units into the skin at bedtime.  15 mL  11  . Insulin Pen Needle (B-D ULTRAFINE III SHORT PEN) 31G X 8 MM MISC Use as directed.  100 each  11  . metFORMIN (GLUCOPHAGE) 1000 MG tablet Take 1 tablet (1,000 mg total) by mouth 2 (two) times daily with a meal.  180 tablet  3  . Multiple Vitamin (MULTIVITAMIN) tablet Take 1 tablet by mouth daily.        . pantoprazole (PROTONIX) 40 MG tablet Take 1 tablet (40 mg total) by mouth daily.  90 tablet  3  . ramipril (ALTACE) 10 MG capsule Take 1 capsule (10 mg total) by mouth daily.  90 capsule  3  . simvastatin (ZOCOR) 20 MG tablet Take 1 tablet (20 mg total) by mouth at bedtime.  90  tablet  3  . traMADol (ULTRAM) 50 MG tablet TAKE 1/2 TO 1 TABLET BY MOUTH 3 TIMES DAILY AS NEEDED  60 tablet  0    Allergies  Allergen Reactions  . Sulfonamide Derivatives     REACTION: eyes red and blurry    Past Medical History  Diagnosis Date  . Allergic rhinitis   . Diabetes mellitus     Type II  . Diverticulosis of colon   . GERD (gastroesophageal reflux disease)   . Hyperlipidemia   . Hypertension   . Hx of colonic polyps   . Gout   . Hemorrhoids   . Family history of colon cancer father  . Anemia   . Tachycardia, paroxysmal 09-2010    Past Surgical History  Procedure Date  . Abdominal hysterectomy 1972  . Cesarean section   . Breast lumpectomy 02/07    Left  (Young)  Benign  . Breast biopsy 02/08    left- Benign     Family History  Problem Relation Age of Onset  . Kidney disease Mother   . Colon cancer Father   . Heart disease Sister  CVA  . Lung cancer Brother   . Breast cancer      neice    History   Social History  . Marital Status: Married    Spouse Name: N/A    Number of Children: 3  . Years of Education: N/A   Occupational History  . 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 Smoking status: Never Smoker   . Smokeless tobacco: Never Used  . Alcohol Use: No  . Drug Use: No  . Sexually Active: Not on file   Other Topics Concern  . Not on file   Social History Narrative  . No narrative on file   Review of Systems Got  aching spell all over body once Sleeps okay in general Appetite is fairly good now--had gone about 1 month ago but is better now    Objective:   Physical Exam  Constitutional: She appears well-developed and well-nourished. No distress.  Neck: Normal range of motion. Neck supple.  Cardiovascular: Normal rate, regular rhythm, normal heart sounds and intact distal pulses.  Exam reveals no gallop.   No murmur heard. Pulmonary/Chest: Effort normal and breath  sounds normal. No respiratory distress. She has no wheezes. She has no rales.  Musculoskeletal: She exhibits no edema and no tenderness.  Lymphadenopathy:    She has no cervical adenopathy.  Psychiatric: She has a normal mood and affect. Her behavior is normal. Judgment and thought content normal.          Assessment & Plan:

## 2011-08-23 NOTE — Assessment & Plan Note (Signed)
Has been controlled on the protonix

## 2011-08-26 ENCOUNTER — Telehealth: Payer: Self-pay | Admitting: Hematology and Oncology

## 2011-08-26 NOTE — Telephone Encounter (Signed)
Referred by Dr. Yancey Flemings Dx- IDA

## 2011-08-30 ENCOUNTER — Ambulatory Visit: Payer: Medicare Other

## 2011-08-30 ENCOUNTER — Other Ambulatory Visit (HOSPITAL_BASED_OUTPATIENT_CLINIC_OR_DEPARTMENT_OTHER): Payer: Medicare Other | Admitting: Lab

## 2011-08-30 ENCOUNTER — Telehealth: Payer: Self-pay | Admitting: *Deleted

## 2011-08-30 ENCOUNTER — Ambulatory Visit (HOSPITAL_BASED_OUTPATIENT_CLINIC_OR_DEPARTMENT_OTHER): Payer: Medicare Other | Admitting: Hematology and Oncology

## 2011-08-30 ENCOUNTER — Telehealth: Payer: Self-pay | Admitting: Hematology and Oncology

## 2011-08-30 ENCOUNTER — Encounter: Payer: Self-pay | Admitting: *Deleted

## 2011-08-30 VITALS — BP 157/89 | HR 75 | Temp 97.0°F | Ht 63.5 in | Wt 138.1 lb

## 2011-08-30 DIAGNOSIS — D539 Nutritional anemia, unspecified: Secondary | ICD-10-CM

## 2011-08-30 LAB — CBC & DIFF AND RETIC
BASO%: 0.6 % (ref 0.0–2.0)
Basophils Absolute: 0.1 10*3/uL (ref 0.0–0.1)
EOS%: 1.1 % (ref 0.0–7.0)
HGB: 9.2 g/dL — ABNORMAL LOW (ref 11.6–15.9)
MCH: 23.7 pg — ABNORMAL LOW (ref 25.1–34.0)
MCHC: 30 g/dL — ABNORMAL LOW (ref 31.5–36.0)
MCV: 78.9 fL — ABNORMAL LOW (ref 79.5–101.0)
MONO%: 6 % (ref 0.0–14.0)
RBC: 3.89 10*6/uL (ref 3.70–5.45)
RDW: 19.9 % — ABNORMAL HIGH (ref 11.2–14.5)
Retic Ct Abs: 94.92 10*3/uL — ABNORMAL HIGH (ref 33.70–90.70)

## 2011-08-30 LAB — MORPHOLOGY: PLT EST: ADEQUATE

## 2011-08-30 LAB — URINALYSIS, MICROSCOPIC - CHCC
Ketones: NEGATIVE mg/dL
Protein: NEGATIVE mg/dL
Specific Gravity, Urine: 1.015 (ref 1.003–1.035)
pH: 6 (ref 4.6–8.0)

## 2011-08-30 NOTE — Progress Notes (Signed)
Dr.    Tillman Abide      -       Primary Dr.    Kizzie Furnish     -       [facility]   [place].  Cell    Phone        [phone].

## 2011-08-30 NOTE — Progress Notes (Signed)
CC:   Karie Schwalbe, MD Wilhemina Bonito. Marina Goodell, MD  IDENTIFYING STATEMENT:  The patient is a 76 year old woman seen at request of Dr. Alphonsus Sias with iron-deficiency anemia.  HISTORY OF PRESENT ILLNESS:  The patient notes that she was diagnosed with iron-deficiency anemia a year and a half ago and has since been on oral iron.  Despite this, her levels have not improved.  She also notes that she has not had any overt blood loss.  She also describes having undergone an EGD, colonoscopy, and small capsule endoscopy earlier last year, which were all unremarkable.  She notes a history of heavy menses prior to hysterectomy in 1972.  She has been diabetic for 12 years.  She has not had any significant weight loss.  Denies pain.  She eats a well- balanced diet.  Iron does not constipate her.  I reviewed her CBCs through Dr. Karle Starch office as follows:  08/16/2011 white cell count 8.4, hemoglobin 9.6, hematocrit 30.1, platelets 326.  11/03/2010 white cell count 8.3, hemoglobin 10, hematocrit 32, platelets 52.  02/24/2010 white cell count 9.6, hemoglobin 8.6, hematocrit 27.7, platelets 246. 10/16/2007 white cell count 6.9, hemoglobin 12.3, hematocrit 38, platelets 203.  PAST MEDICAL HISTORY: 1. Hyperlipidemia. 2. Type 2 diabetes. 3. Hypertension. 4. Gout. 5. GERD. 6. Allergic rhinitis. 7. Diverticulosis. 8. Status post total abdominal hysterectomy in 1972. 9. Status post left benign breast biopsy in 2000.  ALLERGIES:  Sulfonamides.  MEDICATIONS:  Tylenol Arthritis 650 q.8 hours p.r.n., aspirin 81 mg daily, DiaBeta 5 mg daily, colchicine 0.6 mg b.i.d. p.r.n., ferrous sulfate 325 mg (65 iron) t.i.d., Lantus 10 units daily, insulin as directed, metformin 1000 mg b.i.d., multivitamin 1 tablet daily, Protonix 40 mg daily, Altace 10 mg daily, Zocor 20 mg q.h.s., Ultram 1- 1/2 to 1 tablet t.i.d. p.r.n.  SOCIAL HISTORY:  The patient is married with 3 children.  She denies alcohol or tobacco use.  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  FAMILY HISTORY:  The patient's father had colon cancer.  Brother had lung cancer.  Maternal niece had breast cancer.  Mother had anemia.  REVIEW OF SYSTEMS:  Denies fever, chills, night sweats, anorexia, weight loss.  GI:  Denies nausea, vomiting, abdominal pain, diarrhea, melena, hematochezia.  GU:  Denies dysuria, hematuria, nocturia, frequency. Respirations:  Denies cough, hemoptysis, wheeze, shortness of breath. Musculoskeletal:  Admits to pain, especially in the knees and ankles. Skin:  Denies bruising or bleeding.  Neurologic:  Denies headache, vision changes, or extremity weakness.  Rest of review of systems negative.  PHYSICAL EXAMINATION:  General:  The patient is alert and oriented x3. Vitals:  Pulse 75, blood pressure 157/89, temperature 97, respirations 20, weight 138 pounds.  HEENT:  Head is atraumatic, normocephalic. Sclerae anicteric.  Pupils equal, round, and reactive to light.  Mouth moist with no thrush or ulcerations.  Neck:  Supple without adenopathy. Chest:  Demonstrates good air entry bilaterally.  CVS:  First and second heart sounds present with no added sounds or murmurs.  Abdomen:  No masses or hepatosplenomegaly.  Bowel sounds present.  Extremities:  No calf tenderness.  Lymph Nodes:  No adenopathy.  CNS:  Nonfocal.  IMPRESSION AND PLAN:  Ms. Bedgood is a 76 year old woman with iron- deficiency anemia.  I note mildly declining levels over the last year and a half.  There does not appear to be any source of blood loss.  Her white cell count and platelets and differential appear unremarkable. Thus, I do  not think she has a bone marrow issue.  She may have problems with absorption.  Also, she is not on prescription iron.  She may benefit from short-term replacement therapy.  However, before we do so, I will do additional workup which would include repeating CBC with diff. We will also obtain anemia panel to include  iron, TIBC,ferritin, B12, and serum folate levels.  Will obtain urinalysis to rule out hematuria.  Rule out hemolysis with Coombs and haptoglobin.  Rule out plasma cell dyscrasias with an SPEP.  The patient who is here with her husband had a number of questions, which were all answered to her satisfaction.  We reviewed the etiology and possible treatment of anemia.  I more than half the time in discussion and coordinating care.  She follows up in 2 weeks' time to discuss results and receive further recommendations.    ______________________________ Laurice Record, M.D. LIO/MEDQ  D:  08/30/2011  T:  08/30/2011  Job:  [id number]

## 2011-08-30 NOTE — Progress Notes (Signed)
This office note has been dictated.

## 2011-08-30 NOTE — Telephone Encounter (Signed)
Message copied by Sueanne Margarita on Tue Aug 30, 2011  5:13 PM ------      Message from: Tillman Abide I      Created: Wed Aug 24, 2011  7:58 AM       Please call      The overall diabetic control continues to be good with HgbA1c of 7.3%. Due to the high fasting sugars, please increase the lantus to 15 units daily. If the sugars are still over 160 regularly in the morning after a week or so, go up to 20 units      Urine and blood tests show normal kidney function      Chol at goal with total of 131 and LDL or bad chol of 67      Liver and thyroid are normal

## 2011-08-30 NOTE — Telephone Encounter (Signed)
Pt scheduled for 2/6 for  md and feraheme,printed for pt  aom

## 2011-08-30 NOTE — Telephone Encounter (Signed)
.  left message to have patient return my call. letter mailed to patients home address with results. 

## 2011-08-31 NOTE — Telephone Encounter (Signed)
Spoke with patient and advised results   

## 2011-09-01 LAB — DIRECT ANTIGLOBULIN TEST (NOT AT ARMC): DAT IgG: NEGATIVE

## 2011-09-01 LAB — IRON AND TIBC
Iron: 42 ug/dL (ref 42–145)
TIBC: 309 ug/dL (ref 250–470)
UIBC: 267 ug/dL (ref 125–400)

## 2011-09-01 LAB — COMPREHENSIVE METABOLIC PANEL
AST: 18 U/L (ref 0–37)
Albumin: 4 g/dL (ref 3.5–5.2)
Alkaline Phosphatase: 74 U/L (ref 39–117)
BUN: 10 mg/dL (ref 6–23)
Creatinine, Ser: 0.82 mg/dL (ref 0.50–1.10)
Glucose, Bld: 103 mg/dL — ABNORMAL HIGH (ref 70–99)

## 2011-09-01 LAB — SPEP & IFE WITH QIG
IgA: 248 mg/dL (ref 69–380)
IgG (Immunoglobin G), Serum: 1350 mg/dL (ref 690–1700)
Total Protein, Serum Electrophoresis: 6.9 g/dL (ref 6.0–8.3)

## 2011-09-01 LAB — HAPTOGLOBIN: Haptoglobin: 259 mg/dL — ABNORMAL HIGH (ref 30–200)

## 2011-09-01 LAB — FERRITIN: Ferritin: 16 ng/mL (ref 10–291)

## 2011-09-01 LAB — VITAMIN B12: Vitamin B-12: 331 pg/mL (ref 211–911)

## 2011-09-12 ENCOUNTER — Telehealth: Payer: Self-pay | Admitting: Internal Medicine

## 2011-09-12 MED ORDER — GLUCOSE BLOOD VI STRP: ORAL_STRIP | Status: DC

## 2011-09-12 MED ORDER — ONETOUCH ULTRASOFT LANCETS MISC: Status: DC

## 2011-09-12 NOTE — Telephone Encounter (Signed)
Patient would like to have her Diabetic Strips and Needle supply changed from [facility] 90day supply to [facility] 30 day supply.

## 2011-09-12 NOTE — Telephone Encounter (Signed)
Spoke with patient and advised results rx sent to pharmacy by e-script  

## 2011-09-14 ENCOUNTER — Ambulatory Visit (HOSPITAL_BASED_OUTPATIENT_CLINIC_OR_DEPARTMENT_OTHER): Payer: Medicare Other

## 2011-09-14 ENCOUNTER — Ambulatory Visit: Payer: Medicare Other | Admitting: Hematology and Oncology

## 2011-09-14 VITALS — BP 143/74 | HR 76 | Temp 96.8°F | Ht 63.5 in | Wt 141.2 lb

## 2011-09-14 DIAGNOSIS — D649 Anemia, unspecified: Secondary | ICD-10-CM

## 2011-09-14 DIAGNOSIS — D539 Nutritional anemia, unspecified: Secondary | ICD-10-CM

## 2011-09-14 DIAGNOSIS — D509 Iron deficiency anemia, unspecified: Secondary | ICD-10-CM

## 2011-09-14 MED ORDER — SODIUM CHLORIDE 0.9 % IV SOLN
1020.0000 mg | Freq: Once | INTRAVENOUS | Status: AC
  Administered 2011-09-14: 1020 mg via INTRAVENOUS
  Filled 2011-09-14: qty 34

## 2011-09-14 MED ORDER — SODIUM CHLORIDE 0.9 % IV SOLN
Freq: Once | INTRAVENOUS | Status: AC
  Administered 2011-09-14: 17:00:00 via INTRAVENOUS

## 2011-09-14 MED ORDER — SODIUM CHLORIDE 0.9 % IJ SOLN
3.0000 mL | Freq: Once | INTRAMUSCULAR | Status: DC | PRN
  Filled 2011-09-14: qty 10

## 2011-09-14 NOTE — Progress Notes (Signed)
Addended by: Arlan Organ I on: 09/14/2011 04:21 PM   Modules accepted: Orders, Level of Service

## 2011-09-14 NOTE — Progress Notes (Signed)
This office note has been dictated.

## 2011-09-15 NOTE — Progress Notes (Signed)
CC:   Angelica Schwalbe, MD  IDENTIFYING STATEMENT:  The patient is a 76 year old woman who presents to discuss results.  INTERVAL HISTORY:  In summary, the patient was found to be iron deficient.  She had been on oral iron, but despite this her iron levels have not improved.  She has undergone an EGD, colonoscopy, and small capsule endoscopy, which were all unremarkable.  Of note, she did have history of heavy menses prior to hysterectomy in 1972.  She is also diabetic. Otherwise, she is asymptomatic.  She had blood work through the office and results were as follows. 08/28/2011, white cell count 9, hemoglobin 9.2, hematocrit 30.7, platelets 335; peripheral smear noted polychromasia with few teardrop cells.  The red blood cells were slightly hypochromic and microcytic. Urinalysis revealed trace blood and trace leukocyte esterase; BNP and creatinine 10 and 0.82, respectively; there was no evidence of hemolysis, direct Coombs was negative, and haptoglobin was not low at 2059; SPEP showed a nonspecific pattern with normal immunoglobulin levels; ferritin 16, iron 42, TIBC 309, and saturation 14%.  MEDICATIONS:  OTC iron 65 mg 3 times a day.  The rest of meds reviewed and updated.  PAST MEDICAL HISTORY/FAMILY HISTORY/SOCIAL HISTORY:  Unchanged.  REVIEW OF SYSTEMS:  Besides a little fatigued, she is able to tolerate all activities of daily living.  The rest of review of systems negative.  PHYSICAL EXAM:  General: The patient is a well-appearing woman in no distress.  Vitals: Pulse 76, blood pressure 143/74, temperature 96.8, respirations 20, and weight 141 pounds.  HEENT: Head is atraumatic, normocephalic.  Sclerae anicteric.  Mouth moist.  Chest:  Clear. Abdomen:  Soft, nontender.  Bowel sounds present.  Extremities: No calf tenderness.  CNS: Nonfocal.  LAB DATA:  As above.  In addition, her sodium 138, potassium 4.7, chloride 99, CO2 26, glucose 103, T-bilirubin 0.5, alkaline  phosphatase 74, AST 18, ALT 13, and calcium 9.1.  B12 331.  IMPRESSION AND PLAN:  Angelica Murphy is a 76 year old woman who appears to have multifactorial anemia of iron deficiency anemia with anemia of chronic disease, secondary to diabetes.  Her ferritin levels are low and she may have some issue with iron absorption despite oral iron.  She will receive feraheme.  She returns in a few week's time with labs.    ______________________________ Laurice Record, M.D. LIO/MEDQ  D:  09/14/2011  T:  09/15/2011  Job:  [id number]

## 2011-09-22 ENCOUNTER — Ambulatory Visit
Admission: RE | Admit: 2011-09-22 | Discharge: 2011-09-22 | Disposition: A | Discharge status: Home / Self Care | Payer: Medicare Other | Source: Ambulatory Visit | Attending: Internal Medicine | Admitting: Internal Medicine

## 2011-09-22 DIAGNOSIS — Z1231 Encounter for screening mammogram for malignant neoplasm of breast: Secondary | ICD-10-CM

## 2011-09-28 ENCOUNTER — Encounter: Payer: Self-pay | Admitting: *Deleted

## 2011-09-29 ENCOUNTER — Telehealth: Payer: Self-pay | Admitting: *Deleted

## 2011-09-29 NOTE — Telephone Encounter (Signed)
[facility] calling concerned about pt's bp, she complaining of headache which is sinus and they are treating, they checked pt's bp and it was 196/101, pt states she did take her medicine. [facility] wanted to schedule appt for pt if Dr.Letvak thinks she needs to be seen. Per [facility] neurological exam was normal. Please advise

## 2011-09-29 NOTE — Telephone Encounter (Signed)
Set up an appt for next week to recheck Make sure patient is not taking any decongestants

## 2011-09-29 NOTE — Telephone Encounter (Signed)
.  left message to have patient return my call.  

## 2011-09-29 NOTE — Telephone Encounter (Signed)
Spoke with patient and advised results Patient will call for an appointment

## 2011-10-28 ENCOUNTER — Other Ambulatory Visit: Payer: Self-pay | Admitting: Internal Medicine

## 2011-11-22 ENCOUNTER — Other Ambulatory Visit: Payer: Medicare Other

## 2011-12-22 ENCOUNTER — Encounter: Payer: Self-pay | Admitting: Internal Medicine

## 2012-01-05 ENCOUNTER — Other Ambulatory Visit: Payer: Self-pay | Admitting: Internal Medicine

## 2012-01-09 ENCOUNTER — Ambulatory Visit (INDEPENDENT_AMBULATORY_CARE_PROVIDER_SITE_OTHER): Payer: Medicare Other | Admitting: Internal Medicine

## 2012-01-09 ENCOUNTER — Encounter: Payer: Self-pay | Admitting: Internal Medicine

## 2012-01-09 VITALS — BP 142/80 | HR 90 | Temp 98.0°F | Wt 139.0 lb

## 2012-01-09 DIAGNOSIS — M533 Sacrococcygeal disorders, not elsewhere classified: Secondary | ICD-10-CM

## 2012-01-09 NOTE — Assessment & Plan Note (Signed)
Has used ibuprofen for general arthritis pain--and it helps Hard to tell if it helps the tailbone Reassured--not serious Should get better on its own

## 2012-01-09 NOTE — Progress Notes (Signed)
Subjective:    Patient ID: Angelica Murphy, female    DOB: March 11, 1936, 76 y.o.   MRN: [id number]  HPI Has pain over her tailbone At first thought it was hemorrhoids but was too high up [place] off chair in March---thought the soreness might be from that Soreness most noticeable in last 4-6 weeks  Pain with some sitting positions No problems walking No problems in bed  No discharge  Current Outpatient Prescriptions on File Prior to Visit  Medication Sig Dispense Refill  . acetaminophen (ARTHRITIS PAIN RELIEF) 650 MG CR tablet Take 650 mg by mouth every 8 (eight) hours as needed.        Marland Kitchen aspirin 81 MG EC tablet Take 81 mg by mouth daily.        . bisoprolol (ZEBETA) 5 MG tablet Take 1 tablet (5 mg total) by mouth daily.  30 tablet  12  . colchicine 0.6 MG tablet Take 1 tablet (0.6 mg total) by mouth 2 (two) times daily as needed. for gout flare  60 tablet  1  . ferrous sulfate 325 (65 FE) MG tablet TAKE 1 TABLET BY MOUTH THREE TIMES A DAY  90 tablet  5  . glucose blood (ONE TOUCH ULTRA TEST) test strip 1 each by Other route as needed. Use as instructed to check blood sugar once daily dx:250.00  100 each  11  . insulin glargine (LANTUS SOLOSTAR) 100 UNIT/ML injection Inject 10 Units into the skin at bedtime.  15 mL  11  . Insulin Pen Needle (B-D ULTRAFINE III SHORT PEN) 31G X 8 MM MISC Use as directed.  100 each  11  . Lancets (ONETOUCH ULTRASOFT) lancets 1 each by Other route as needed. Use as instructed to check blood sugar once daily dx:250.00  100 each  11  . metFORMIN (GLUCOPHAGE) 1000 MG tablet TAKE 1 TABLET TWICE A DAY WITH A MEAL  180 tablet  2  . Multiple Vitamin (MULTIVITAMIN) tablet Take 1 tablet by mouth daily.        . pantoprazole (PROTONIX) 40 MG tablet TAKE 1 TABLET DAILY  90 tablet  2  . ramipril (ALTACE) 10 MG capsule Take 1 capsule (10 mg total) by mouth daily.  90 capsule  3  . simvastatin (ZOCOR) 20 MG tablet TAKE 1 TABLET AT BEDTIME  90 tablet  2  . traMADol  (ULTRAM) 50 MG tablet TAKE 1/2 TO 1 TABLET BY MOUTH 3 TIMES DAILY AS NEEDED  60 tablet  0    Allergies  Allergen Reactions  . Sulfonamide Derivatives     REACTION: eyes red and blurry    Past Medical History  Diagnosis Date  . Allergic rhinitis   . Diabetes mellitus     Type II  . Diverticulosis of colon   . GERD (gastroesophageal reflux disease)   . Hyperlipidemia   . Hypertension   . Hx of colonic polyps   . Gout   . Hemorrhoids   . Family history of colon cancer father  . Anemia   . Tachycardia, paroxysmal 09-2010    Past Surgical History  Procedure Date  . Abdominal hysterectomy 1972  . Cesarean section   . Breast lumpectomy 02/07    Left  (Young)  Benign  . Breast biopsy 02/08    left- Benign     Family History  Problem Relation Age of Onset  . Kidney disease Mother   . Colon cancer Father   . Heart disease Sister  CVA  . Lung cancer Brother   . Breast cancer      neice    History   Social History  . Marital Status: Married    Spouse Name: N/A    Number of Children: 3  . Years of Education: N/A   Occupational History  . 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 Smoking status: Never Smoker   . Smokeless tobacco: Never Used  . Alcohol Use: No  . Drug Use: No  . Sexually Active: Not on file   Other Topics Concern  . Not on file   Social History Narrative  . No narrative on file   Review of Systems No fever Some stomach trouble ---Angelica Murphy relates it to the knot (gets gassy and distended at times)     Objective:   Physical Exam  Constitutional: Angelica Murphy appears well-developed and well-nourished. No distress.  Musculoskeletal:       Direct tenderness over tip of coccyx No cyst, discharge or inflammation          Assessment & Plan:

## 2012-01-09 NOTE — Patient Instructions (Signed)
Tailbone Injury The tailbone (coccyx) is the small bone at the lower end of the spine. A tailbone injury may involve stretched ligaments, bruising, or a broken bone (fracture). Women are more vulnerable to this injury due to having a wider pelvis. CAUSES  This type of injury typically occurs from falling and landing on the tailbone. Repeated strain or friction from actions such as rowing and bicycling may also injure the area. The tailbone can be injured during childbirth. Infections or tumors may also press on the tailbone and cause pain. Sometimes, the cause of injury is unknown. SYMPTOMS   Bruising.   Pain when sitting.   Painful bowel movements.   In women, pain during intercourse.  DIAGNOSIS  Your caregiver can diagnose a tailbone injury based on your symptoms and a physical exam. X-rays may be taken if a fracture is suspected. Your caregiver may also use an MRI scan imaging test to evaluate your symptoms. TREATMENT  Your caregiver may prescribe medicines to help relieve your pain. Most tailbone injuries heal on their own in 4 to 6 weeks. However, if the injury is caused by an infection or tumor, the recovery period may vary. PREVENTION  Wear appropriate padding and sports gear when bicycling and rowing. This can help prevent an injury from repeated strain or friction. HOME CARE INSTRUCTIONS   Put ice on the injured area.   Put ice in a plastic bag.   Place a towel between your skin and the bag.   Leave the ice on for 15 to 20 minutes, every hour while awake for the first 1 to 2 days.   Sit on a large, rubber or inflated ring or cushion to ease your pain. Lean forward when sitting to help decrease discomfort.   Avoid sitting for long periods of time.   Increase your activity as the pain allows.   Only take over-the-counter or prescription medicines for pain, discomfort, or fever as directed by your caregiver.   You may use stool softeners if it is painful to have a bowel  movement, or as directed by your caregiver.   Eat a diet with plenty of fiber to help prevent constipation.   Keep all follow-up appointments as directed by your caregiver.  SEEK MEDICAL CARE IF:   Your pain becomes worse.   Your bowel movements cause a great deal of discomfort.   You are unable to have a bowel movement.   You have a fever.  MAKE SURE YOU:  Understand these instructions.   Will watch your condition.   Will get help right away if you are not doing well or get worse.  Document Released: 07/22/2000 Document Revised: 07/14/2011 Document Reviewed: 02/17/2011 Exit[employer] Patient Information 2012 [employer]. 

## 2012-01-20 ENCOUNTER — Other Ambulatory Visit (HOSPITAL_BASED_OUTPATIENT_CLINIC_OR_DEPARTMENT_OTHER): Payer: Medicare Other

## 2012-01-20 DIAGNOSIS — D539 Nutritional anemia, unspecified: Secondary | ICD-10-CM

## 2012-01-20 LAB — CBC WITH DIFFERENTIAL/PLATELET
BASO%: 0.5 % (ref 0.0–2.0)
LYMPH%: 21.8 % (ref 14.0–49.7)
MCHC: 31.9 g/dL (ref 31.5–36.0)
MONO#: 0.6 10*3/uL (ref 0.1–0.9)
MONO%: 6.7 % (ref 0.0–14.0)
Platelets: 309 10*3/uL (ref 145–400)
RBC: 4.16 10*6/uL (ref 3.70–5.45)
WBC: 8.4 10*3/uL (ref 3.9–10.3)

## 2012-01-20 LAB — IRON AND TIBC
%SAT: 14 % — ABNORMAL LOW (ref 20–55)
Iron: 36 ug/dL — ABNORMAL LOW (ref 42–145)
TIBC: 250 ug/dL (ref 250–470)

## 2012-01-20 LAB — BASIC METABOLIC PANEL
CO2: 26 mEq/L (ref 19–32)
Calcium: 9.6 mg/dL (ref 8.4–10.5)
Sodium: 141 mEq/L (ref 135–145)

## 2012-01-25 ENCOUNTER — Encounter: Payer: Self-pay | Admitting: Hematology and Oncology

## 2012-01-25 ENCOUNTER — Telehealth: Payer: Self-pay | Admitting: Hematology and Oncology

## 2012-01-25 ENCOUNTER — Ambulatory Visit (HOSPITAL_BASED_OUTPATIENT_CLINIC_OR_DEPARTMENT_OTHER): Payer: Medicare Other | Admitting: Hematology and Oncology

## 2012-01-25 VITALS — BP 172/80 | HR 84 | Temp 97.4°F | Ht 63.5 in | Wt 141.0 lb

## 2012-01-25 DIAGNOSIS — D638 Anemia in other chronic diseases classified elsewhere: Secondary | ICD-10-CM

## 2012-01-25 DIAGNOSIS — D539 Nutritional anemia, unspecified: Secondary | ICD-10-CM

## 2012-01-25 DIAGNOSIS — D509 Iron deficiency anemia, unspecified: Secondary | ICD-10-CM

## 2012-01-25 NOTE — Progress Notes (Signed)
This office note has been dictated.

## 2012-01-25 NOTE — Telephone Encounter (Signed)
Gave pt appt for November 2013 lab, MD and IV Iron

## 2012-01-25 NOTE — Patient Instructions (Signed)
Angelica Murphy  [id number]  [facility] Discharge Instructions  RECOMMENDATIONS MADE BY THE CONSULTANT AND ANY TEST RESULTS WILL BE SENT TO YOUR REFERRING DOCTOR.   EXAM FINDINGS BY MD TODAY AND SIGNS AND SYMPTOMS TO REPORT TO CLINIC OR PRIMARY MD:   Your current list of medications are: Current Outpatient Prescriptions  Medication Sig Dispense Refill  . acetaminophen (ARTHRITIS PAIN RELIEF) 650 MG CR tablet Take 650 mg by mouth every 8 (eight) hours as needed.        Marland Kitchen aspirin 81 MG EC tablet Take 81 mg by mouth daily.        . bisoprolol (ZEBETA) 5 MG tablet Take 1 tablet (5 mg total) by mouth daily.  30 tablet  12  . colchicine 0.6 MG tablet Take 1 tablet (0.6 mg total) by mouth 2 (two) times daily as needed. for gout flare  60 tablet  1  . ferrous sulfate 325 (65 FE) MG tablet TAKE 1 TABLET BY MOUTH THREE TIMES A DAY  90 tablet  5  . glucose blood (ONE TOUCH ULTRA TEST) test strip 1 each by Other route as needed. Use as instructed to check blood sugar once daily dx:250.00  100 each  11  . insulin glargine (LANTUS SOLOSTAR) 100 UNIT/ML injection Inject 10 Units into the skin at bedtime.  15 mL  11  . Insulin Pen Needle (B-D ULTRAFINE III SHORT PEN) 31G X 8 MM MISC Use as directed.  100 each  11  . Lancets (ONETOUCH ULTRASOFT) lancets 1 each by Other route as needed. Use as instructed to check blood sugar once daily dx:250.00  100 each  11  . metFORMIN (GLUCOPHAGE) 1000 MG tablet       . Multiple Vitamin (MULTIVITAMIN) tablet Take 1 tablet by mouth daily.        . pantoprazole (PROTONIX) 40 MG tablet TAKE 1 TABLET DAILY  90 tablet  2  . ramipril (ALTACE) 10 MG capsule Take 1 capsule (10 mg total) by mouth daily.  90 capsule  3  . simvastatin (ZOCOR) 20 MG tablet TAKE 1 TABLET AT BEDTIME  90 tablet  2  . traMADol (ULTRAM) 50 MG tablet TAKE 1/2 TO 1 TABLET BY MOUTH 3 TIMES DAILY AS NEEDED  60 tablet  0  . DISCONTD: metFORMIN (GLUCOPHAGE) 1000 MG tablet TAKE 1 TABLET TWICE  A DAY WITH A MEAL  180 tablet  2     INSTRUCTIONS GIVEN AND DISCUSSED:   SPECIAL INSTRUCTIONS/FOLLOW-UP:  See above.  I acknowledge that I have been informed and understand all the instructions given to me and received a copy. I do not have any more questions at this time, but understand that I may call the [facility] at [phone] during business hours should I have any further questions or need assistance in obtaining follow-up care.

## 2012-01-25 NOTE — Progress Notes (Signed)
CC:   Karie Schwalbe, MD  IDENTIFYING STATEMENT:  The patient is a 76 year old woman with history of both iron deficiency and anemia of chronic disease, who presents for followup.  INTERVAL HISTORY:  The patient presented with a ferritin of 16.  She was also on oral iron.  GI workup to including small-bowel exam had shown no source of blood loss.  She received Feraheme on 09/14/2011.  She states she continues on oral iron.  Feels well.  Has had no bleeding.  MEDICATIONS:  Ferrous sulfate 325 mg t.i.d.  Rest of medicines reviewed and updated.  ALLERGIES:  Sulfonamides.  PHYSICAL EXAMINATION:  Alert and oriented x3.  Vitals:  Pulse 84, blood pressure 172/80, temperature 97.4, respirations 20, weight 141 pounds. HEENT:  Head is atraumatic, normocephalic.  Sclerae anicteric.  Mouth moist.  Neck:  Supple.  Chest:  Clear.  CVS:  Unremarkable.  Abdomen: Soft, nontender.  Bowel sounds present.  Extremities:  No edema.  LABORATORY DATA:  01/20/2012:  White cell count 8.4, hemoglobin 10.9, hematocrit 34.3, platelets 309.  Sodium 141, potassium 4.4, chloride 105, CO2 26, BUN 9, creatinine 0.71, glucose 90, calcium 9.6.  iron 36, TIBC 250, saturation 14, Ferritin 78 (16).  IMPRESSION AND PLAN:  Ms. Fite is a 76 year old woman with multifactorial anemia with iron deficiency and anemia of chronic disease.  Status post Feraheme on 09/14/2011.  Ferritin remains greater than 50, thus she will continue with oral iron for the time being. Follows up in 4 months' time.  We will have her repeat labs.  We will assess whether or not she needs IV iron.    ______________________________ Laurice Record, M.D. LIO/MEDQ  D:  01/25/2012  T:  01/25/2012  Job:  [id number]

## 2012-01-27 ENCOUNTER — Other Ambulatory Visit: Payer: Self-pay | Admitting: Internal Medicine

## 2012-02-01 ENCOUNTER — Other Ambulatory Visit: Payer: Self-pay | Admitting: *Deleted

## 2012-02-01 MED ORDER — INSULIN PEN NEEDLE 31G X 8 MM MISC: Status: DC

## 2012-02-21 ENCOUNTER — Ambulatory Visit (INDEPENDENT_AMBULATORY_CARE_PROVIDER_SITE_OTHER): Payer: Medicare Other | Admitting: Internal Medicine

## 2012-02-21 ENCOUNTER — Encounter: Payer: Self-pay | Admitting: Internal Medicine

## 2012-02-21 ENCOUNTER — Encounter: Payer: Self-pay | Admitting: *Deleted

## 2012-02-21 VITALS — BP 140/80 | HR 68 | Temp 98.6°F | Ht 63.0 in | Wt 141.0 lb

## 2012-02-21 DIAGNOSIS — M1710 Unilateral primary osteoarthritis, unspecified knee: Secondary | ICD-10-CM

## 2012-02-21 DIAGNOSIS — E785 Hyperlipidemia, unspecified: Secondary | ICD-10-CM

## 2012-02-21 DIAGNOSIS — K219 Gastro-esophageal reflux disease without esophagitis: Secondary | ICD-10-CM

## 2012-02-21 DIAGNOSIS — E119 Type 2 diabetes mellitus without complications: Secondary | ICD-10-CM

## 2012-02-21 DIAGNOSIS — I10 Essential (primary) hypertension: Secondary | ICD-10-CM

## 2012-02-21 DIAGNOSIS — M159 Polyosteoarthritis, unspecified: Secondary | ICD-10-CM | POA: Insufficient documentation

## 2012-02-21 LAB — HEMOGLOBIN A1C: Hgb A1c MFr Bld: 7.2 % — ABNORMAL HIGH (ref 4.6–6.5)

## 2012-02-21 NOTE — Assessment & Plan Note (Signed)
Needs to start working out Has the tramadol if worsens

## 2012-02-21 NOTE — Assessment & Plan Note (Signed)
BP Readings from Last 3 Encounters:  02/21/12 140/80  01/25/12 172/80  01/09/12 142/80   Better today  No changes needed

## 2012-02-21 NOTE — Assessment & Plan Note (Signed)
Quiet on Rx Will continue 

## 2012-02-21 NOTE — Progress Notes (Signed)
Subjective:    Patient ID: Angelica Murphy, female    DOB: 05/29/1936, 76 y.o.   MRN: [id number]  HPI Doing well Coccyx is better Hgb is up on iron therapy. Goes back to Dr Dalene Carrow in 3 months or so  Checks sugars once a day Mostly in AM-- ~130 then No hypoglycemic reactions Now on 15 of lantus No hypoglycemic reactions Just had eye exam  No regular exercise Knee arthritis keeping her from walking Hasn't needed tramadol of late  Active with volunteer work Had been travelling to [place] to care for uncle---recently died  Stomach has been quiet Takes the med daily  Current Outpatient Prescriptions on File Prior to Visit  Medication Sig Dispense Refill  . acetaminophen (ARTHRITIS PAIN RELIEF) 650 MG CR tablet Take 650 mg by mouth every 8 (eight) hours as needed.        Marland Kitchen aspirin 81 MG EC tablet Take 81 mg by mouth daily.        . bisoprolol (ZEBETA) 5 MG tablet Take 1 tablet (5 mg total) by mouth daily.  30 tablet  12  . colchicine 0.6 MG tablet Take 1 tablet (0.6 mg total) by mouth 2 (two) times daily as needed. for gout flare  60 tablet  1  . ferrous sulfate 325 (65 FE) MG tablet TAKE 1 TABLET BY MOUTH THREE TIMES A DAY  90 tablet  5  . glucose blood (ONE TOUCH ULTRA TEST) test strip 1 each by Other route as needed. Use as instructed to check blood sugar once daily dx:250.00  100 each  11  . Insulin Pen Needle (B-D ULTRAFINE III SHORT PEN) 31G X 8 MM MISC Use as directed.  100 each  11  . Lancets (ONETOUCH ULTRASOFT) lancets 1 each by Other route as needed. Use as instructed to check blood sugar once daily dx:250.00  100 each  11  . metFORMIN (GLUCOPHAGE) 1000 MG tablet       . Multiple Vitamin (MULTIVITAMIN) tablet Take 1 tablet by mouth daily.        . pantoprazole (PROTONIX) 40 MG tablet TAKE 1 TABLET DAILY  90 tablet  2  . ramipril (ALTACE) 10 MG capsule TAKE 1 CAPSULE DAILY  90 capsule  2  . simvastatin (ZOCOR) 20 MG tablet TAKE 1 TABLET AT BEDTIME  90 tablet  2  . traMADol  (ULTRAM) 50 MG tablet TAKE 1/2 TO 1 TABLET BY MOUTH 3 TIMES DAILY AS NEEDED  60 tablet  0  . DISCONTD: insulin glargine (LANTUS SOLOSTAR) 100 UNIT/ML injection Inject 10 Units into the skin at bedtime.  15 mL  11    Allergies  Allergen Reactions  . Sulfonamide Derivatives     REACTION: eyes red and blurry    Past Medical History  Diagnosis Date  . Allergic rhinitis   . Diabetes mellitus     Type II  . Diverticulosis of colon   . GERD (gastroesophageal reflux disease)   . Hyperlipidemia   . Hypertension   . Hx of colonic polyps   . Gout   . Hemorrhoids   . Family history of colon cancer father  . Anemia   . Tachycardia, paroxysmal 09-2010    Past Surgical History  Procedure Date  . Abdominal hysterectomy 1972  . Cesarean section   . Breast lumpectomy 02/07    Left  (Young)  Benign  . Breast biopsy 02/08    left- Benign     Family History  Problem Relation Age  of Onset  . Kidney disease Mother   . Colon cancer Father   . Heart disease Sister     CVA  . Lung cancer Brother   . Breast cancer      neice    History   Social History  . Marital Status: Married    Spouse Name: N/A    Number of Children: 3  . Years of Education: N/A   Occupational History  . 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 Smoking status: Never Smoker   . Smokeless tobacco: Never Used  . Alcohol Use: No  . Drug Use: No  . Sexually Active: Not on file   Other Topics Concern  . Not on file   Social History Narrative  . No narrative on file   Review of Systems Appetite spotty Weight still stable Sleeps okay---usually 6 hours per night. Awakens refreshed     Objective:   Physical Exam  Constitutional: She appears well-developed and well-nourished. No distress.  Neck: Normal range of motion. Neck supple. No thyromegaly present.  Cardiovascular: Normal rate, regular rhythm and normal heart sounds.  Exam reveals no  gallop.   No murmur heard. Pulmonary/Chest: Effort normal and breath sounds normal. No respiratory distress. She has no wheezes. She has no rales.  Abdominal: Soft. There is no tenderness.  Musculoskeletal: She exhibits no edema and no tenderness.  Lymphadenopathy:    She has no cervical adenopathy.  Skin: No rash noted. No erythema.       No foot lesions  Psychiatric: She has a normal mood and affect. Her behavior is normal.          Assessment & Plan:

## 2012-02-21 NOTE — Assessment & Plan Note (Signed)
Lab Results  Component Value Date   LDLCALC 67 08/23/2011   At goal

## 2012-02-21 NOTE — Assessment & Plan Note (Signed)
Lab Results  Component Value Date   HGBA1C 7.3* 08/23/2011   Seems to have even better control now Will recheck

## 2012-03-06 ENCOUNTER — Other Ambulatory Visit: Payer: Self-pay | Admitting: Internal Medicine

## 2012-03-06 NOTE — Telephone Encounter (Signed)
Ok to refill 

## 2012-03-06 NOTE — Telephone Encounter (Signed)
Okay #60 x 0 

## 2012-03-15 ENCOUNTER — Other Ambulatory Visit: Payer: Self-pay | Admitting: *Deleted

## 2012-03-15 MED ORDER — INSULIN GLARGINE 100 UNIT/ML ~~LOC~~ SOLN: 15.0000 [IU] | Freq: Every day | SUBCUTANEOUS | Status: DC

## 2012-04-23 ENCOUNTER — Telehealth: Payer: Self-pay | Admitting: Internal Medicine

## 2012-04-23 NOTE — Telephone Encounter (Signed)
The patient called and is hoping to get a call back to discuss if she needs a follow up apt or not.  I offered several times to put her on the schedule to accommodate, however, the patient wanted to hear from the nurse whether or not she needs an apt.

## 2012-04-24 NOTE — Telephone Encounter (Signed)
Patient just wanted to schedule a follow-up appt, appt scheduled

## 2012-06-08 ENCOUNTER — Telehealth: Payer: Self-pay | Admitting: Nurse Practitioner

## 2012-06-08 NOTE — Telephone Encounter (Signed)
Spoke with patient per Dr. Dalene Carrow.  Appointment scheduled on 11/14 made in error.  MD will not be in the office that afternoon.  Can she come in at 0945 instead?  Pt states she will be able to come at 0945.  POF to schedulers to change appt time.

## 2012-06-11 ENCOUNTER — Telehealth: Payer: Self-pay | Admitting: *Deleted

## 2012-06-11 NOTE — Telephone Encounter (Signed)
per orders from 06-08-2012 patient is all ready aware of the appointment  Sent michelle email to adjust patient's treatment on 06-21-2012

## 2012-06-13 ENCOUNTER — Other Ambulatory Visit: Payer: Self-pay | Admitting: *Deleted

## 2012-06-13 ENCOUNTER — Other Ambulatory Visit: Payer: Self-pay | Admitting: Internal Medicine

## 2012-06-13 ENCOUNTER — Telehealth: Payer: Self-pay | Admitting: Hematology and Oncology

## 2012-06-13 NOTE — Telephone Encounter (Signed)
S/w pt today re appts for 11/11 and 11/14. Per LO she will see pt 11/14 (not covering [place]) - d/t per 11/6 pof.

## 2012-06-14 ENCOUNTER — Ambulatory Visit: Payer: Medicare Other | Admitting: Hematology and Oncology

## 2012-06-18 ENCOUNTER — Other Ambulatory Visit (HOSPITAL_BASED_OUTPATIENT_CLINIC_OR_DEPARTMENT_OTHER): Payer: Medicare Other | Admitting: Lab

## 2012-06-18 DIAGNOSIS — D539 Nutritional anemia, unspecified: Secondary | ICD-10-CM

## 2012-06-18 LAB — CBC WITH DIFFERENTIAL/PLATELET
BASO%: 0.3 % (ref 0.0–2.0)
EOS%: 1.1 % (ref 0.0–7.0)
HGB: 9.3 g/dL — ABNORMAL LOW (ref 11.6–15.9)
MCH: 22.4 pg — ABNORMAL LOW (ref 25.1–34.0)
MCHC: 29.3 g/dL — ABNORMAL LOW (ref 31.5–36.0)
MONO#: 0.6 10*3/uL (ref 0.1–0.9)
RDW: 20.4 % — ABNORMAL HIGH (ref 11.2–14.5)
WBC: 10.3 10*3/uL (ref 3.9–10.3)
lymph#: 2.7 10*3/uL (ref 0.9–3.3)

## 2012-06-20 LAB — IRON AND TIBC
%SAT: 10 % — ABNORMAL LOW (ref 20–55)
TIBC: 309 ug/dL (ref 250–470)

## 2012-06-20 LAB — FOLATE: Folate: 23 ng/mL (ref >5.4)

## 2012-06-21 ENCOUNTER — Ambulatory Visit (HOSPITAL_BASED_OUTPATIENT_CLINIC_OR_DEPARTMENT_OTHER): Payer: Medicare Other | Admitting: Hematology and Oncology

## 2012-06-21 ENCOUNTER — Telehealth: Payer: Self-pay | Admitting: Hematology and Oncology

## 2012-06-21 ENCOUNTER — Ambulatory Visit: Payer: Medicare Other | Admitting: Hematology and Oncology

## 2012-06-21 ENCOUNTER — Encounter: Payer: Self-pay | Admitting: Hematology and Oncology

## 2012-06-21 ENCOUNTER — Ambulatory Visit (HOSPITAL_BASED_OUTPATIENT_CLINIC_OR_DEPARTMENT_OTHER): Payer: Medicare Other

## 2012-06-21 VITALS — BP 177/71 | HR 78 | Temp 97.6°F | Resp 20 | Ht 63.0 in | Wt 139.2 lb

## 2012-06-21 DIAGNOSIS — I479 Paroxysmal tachycardia, unspecified: Secondary | ICD-10-CM

## 2012-06-21 DIAGNOSIS — D539 Nutritional anemia, unspecified: Secondary | ICD-10-CM

## 2012-06-21 DIAGNOSIS — D638 Anemia in other chronic diseases classified elsewhere: Secondary | ICD-10-CM

## 2012-06-21 DIAGNOSIS — D509 Iron deficiency anemia, unspecified: Secondary | ICD-10-CM

## 2012-06-21 MED ORDER — SODIUM CHLORIDE 0.9 % IV SOLN
Freq: Once | INTRAVENOUS | Status: AC
  Administered 2012-06-21: 11:00:00 via INTRAVENOUS

## 2012-06-21 MED ORDER — SODIUM CHLORIDE 0.9 % IV SOLN
1020.0000 mg | Freq: Once | INTRAVENOUS | Status: AC
  Administered 2012-06-21: 1020 mg via INTRAVENOUS
  Filled 2012-06-21: qty 34

## 2012-06-21 NOTE — Progress Notes (Signed)
CC:   Angelica Schwalbe, MD  IDENTIFYING STATEMENT:  The patient is a 76 year old woman with anemia who presents for followup.  INTERVAL HISTORY:  Angelica Murphy was seen 4 months ago.  She has not reported rectal bleeding.  She denies abdominal discomfort.  She takes occasional nonsteroidals for arthritis.  Her weight is stable.  She is tolerating oral iron.  Lab results on 06/18/2012 note a white cell count of 10.3, hemoglobin 9.3, hematocrit 31.7, platelets 287, MCV 76.2.  Iron 30 (36), TIBC 209 (250), ferritin 26 (78), folate 23.  MEDICATIONS:  Iron sulfate 65 mg daily.  Rest of medicines reviewed and updated.  PAST MEDICAL HISTORY/FAMILY HISTORY/SOCIAL HISTORY:  Unchanged.  REVIEW OF SYSTEMS:  A 10-point review of systems negative.  PHYSICAL EXAM:  General:  The patient is a well-appearing, well- nourished woman in no distress.  Vitals:  Pulse 78, blood pressure 177/71, temperature 97.6, respirations 20, weight 139 pounds.  HEENT: Head is atraumatic.  Mouth moist.  Chest:  Clear.  CVS:  Unremarkable. Abdomen:  Soft, nontender.  No masses.  Extremities:  No calf tenderness.  LABORATORY DATA:  As above.  IMPRESSION AND PLAN:  Angelica Murphy is a 76 year old woman with multifactorial anemia with anemia of iron deficiency, anemia of chronic disease.  She last received Feraheme on September 14, 2011.  Her ferritin levels are declining.  She gives no history of bleeding.  She will proceed for [facility] after this visit, but I have her returning to be evaluated further with a bone marrow after the holidays.    ______________________________ Laurice Record, M.D. LIO/MEDQ  D:  06/21/2012  T:  06/21/2012  Job:  [id number]

## 2012-06-21 NOTE — Progress Notes (Signed)
This office note has been dictated.

## 2012-06-21 NOTE — Patient Instructions (Signed)
Ferumoxytol injection What is this medicine? FERUMOXYTOL is an iron complex. Iron is used to make healthy red blood cells, which carry oxygen and nutrients throughout the body. This medicine is used to treat iron deficiency anemia in people with chronic kidney disease. This medicine may be used for other purposes; ask your health care provider or pharmacist if you have questions. What should I tell my health care provider before I take this medicine? They need to know if you have any of these conditions: -anemia not caused by low iron levels -high levels of iron in the blood -magnetic resonance imaging (MRI) test scheduled -an unusual or allergic reaction to iron, other medicines, foods, dyes, or preservatives -pregnant or trying to get pregnant -breast-feeding How should I use this medicine? This medicine is for infusion into a vein. It is given by a health care professional in a hospital or clinic setting. Talk to your pediatrician regarding the use of this medicine in children. Special care may be needed. Overdosage: If you think you've taken too much of this medicine contact a poison control center or emergency room at once. Overdosage: If you think you have taken too much of this medicine contact a poison control center or emergency room at once. NOTE: This medicine is only for you. Do not share this medicine with others. What if I miss a dose? It is important not to miss your dose. Call your doctor or health care professional if you are unable to keep an appointment. What may interact with this medicine? This medicine may interact with the following medications: -other iron products This list may not describe all possible interactions. Give your health care provider a list of all the medicines, herbs, non-prescription drugs, or dietary supplements you use. Also tell them if you smoke, drink alcohol, or use illegal drugs. Some items may interact with your medicine. What should I watch  for while using this medicine? Visit your doctor or healthcare professional regularly. Tell your doctor or healthcare professional if your symptoms do not start to get better or if they get worse. You may need blood work done while you are taking this medicine. You may need to follow a special diet. Talk to your doctor. Foods that contain iron include: whole grains/cereals, dried fruits, beans, or peas, leafy green vegetables, and organ meats (liver, kidney). What side effects may I notice from receiving this medicine? Side effects that you should report to your doctor or health care professional as soon as possible: -allergic reactions like skin rash, itching or hives, swelling of the face, lips, or tongue -breathing problems -changes in blood pressure -feeling faint or lightheaded, falls -fever or chills -flushing, sweating, or hot feelings -swelling of the ankles or feet Side effects that usually do not require medical attention (Report these to your doctor or health care professional if they continue or are bothersome.): -diarrhea -headache -nausea, vomiting -stomach pain This list may not describe all possible side effects. Call your doctor for medical advice about side effects. You may report side effects to FDA at 1-800-FDA-1088. Where should I keep my medicine? This drug is given in a hospital or clinic and will not be stored at home. NOTE: This sheet is a summary. It may not cover all possible information. If you have questions about this medicine, talk to your doctor, pharmacist, or health care provider.  2012, [employer]vier/[employer] Standard. (04/16/2008 9:48:25 PM) 

## 2012-06-21 NOTE — Patient Instructions (Signed)
Angelica Murphy  [id number]   [facility] - AFTER VISIT SUMMARY   **RECOMMENDATIONS MADE BY THE CONSULTANT AND ANY TEST    RESULTS WILL BE SENT TO YOUR REFERRING DOCTORS.   YOUR EXAM FINDINGS, LABS AND RESULTS WERE DISCUSSED BY YOUR MD TODAY.  YOU CAN GO TO THE [place] WEB SITE FOR INSTRUCTIONS ON HOW TO ASSESS MY CHART FOR ADDITIONAL INFORMATION AS NEEDED.  Your Updated drug allergies are: Allergies as of 06/21/2012 - Review Complete 06/21/2012  Allergen Reaction Noted  . Sulfonamide derivatives  10/16/2006    Your current list of medications are: Current Outpatient Prescriptions  Medication Sig Dispense Refill  . acetaminophen (ARTHRITIS PAIN RELIEF) 650 MG CR tablet Take 650 mg by mouth every 8 (eight) hours as needed.        Marland Kitchen aspirin 81 MG EC tablet Take 81 mg by mouth daily.        . bisoprolol (ZEBETA) 5 MG tablet TAKE 1 TABLET BY MOUTH DAILY  30 tablet  12  . colchicine 0.6 MG tablet Take 1 tablet (0.6 mg total) by mouth 2 (two) times daily as needed. for gout flare  60 tablet  1  . ferrous sulfate 325 (65 FE) MG tablet TAKE 1 TABLET BY MOUTH THREE TIMES A DAY  90 tablet  5  . glucose blood (ONE TOUCH ULTRA TEST) test strip 1 each by Other route as needed. Use as instructed to check blood sugar once daily dx:250.00  100 each  11  . insulin glargine (LANTUS SOLOSTAR) 100 UNIT/ML injection Inject 15 Units into the skin at bedtime.  15 mL  11  . Insulin Pen Needle (B-D ULTRAFINE III SHORT PEN) 31G X 8 MM MISC Use as directed.  100 each  11  . Lancets (ONETOUCH ULTRASOFT) lancets 1 each by Other route as needed. Use as instructed to check blood sugar once daily dx:250.00  100 each  11  . metFORMIN (GLUCOPHAGE) 1000 MG tablet       . Multiple Vitamin (MULTIVITAMIN) tablet Take 1 tablet by mouth daily.        . pantoprazole (PROTONIX) 40 MG tablet TAKE 1 TABLET DAILY  90 tablet  2  . ramipril (ALTACE) 10 MG capsule TAKE 1 CAPSULE DAILY  90 capsule  2  .  simvastatin (ZOCOR) 20 MG tablet TAKE 1 TABLET AT BEDTIME  90 tablet  2  . traMADol (ULTRAM) 50 MG tablet TAKE 1/2 TO 1 TABLET BY MOUTH 3 TIMES DAILY AS NEEDED  60 tablet  0     INSTRUCTIONS GIVEN AND DISCUSSED:  See attached schedule   SPECIAL INSTRUCTIONS/FOLLOW-UP:  See above.  I acknowledge that I have been informed and understand all the instructions given to me and received a copy.I know to contact the clinic, my physician, or go to the emergency Department if any problems should occur.   I do not have any more questions at this time, but understand that I may call the [facility] at [phone] during business hours should I have any further questions or need assistance in obtaining follow-up care.

## 2012-06-21 NOTE — Telephone Encounter (Signed)
appts made and printed for pt pt aware that iron tx will follow the lab on 11/14 email to mw to add

## 2012-06-22 ENCOUNTER — Ambulatory Visit: Payer: Medicare Other | Admitting: Hematology and Oncology

## 2012-06-25 ENCOUNTER — Encounter: Payer: Self-pay | Admitting: Family Medicine

## 2012-06-25 ENCOUNTER — Ambulatory Visit (INDEPENDENT_AMBULATORY_CARE_PROVIDER_SITE_OTHER): Payer: Medicare Other | Admitting: Family Medicine

## 2012-06-25 ENCOUNTER — Telehealth: Payer: Self-pay | Admitting: Internal Medicine

## 2012-06-25 VITALS — BP 150/68 | HR 84 | Temp 97.7°F | Ht 63.0 in | Wt 140.0 lb

## 2012-06-25 DIAGNOSIS — J019 Acute sinusitis, unspecified: Secondary | ICD-10-CM | POA: Insufficient documentation

## 2012-06-25 DIAGNOSIS — B9689 Other specified bacterial agents as the cause of diseases classified elsewhere: Secondary | ICD-10-CM

## 2012-06-25 MED ORDER — AMOXICILLIN-POT CLAVULANATE 875-125 MG PO TABS: 1.0000 | ORAL_TABLET | Freq: Two times a day (BID) | ORAL | Status: DC

## 2012-06-25 NOTE — Telephone Encounter (Signed)
Has appt today with Dr Milinda Antis

## 2012-06-25 NOTE — Progress Notes (Signed)
Subjective:    Patient ID: Angelica Murphy, female    DOB: 1936/02/27, 76 y.o.   MRN: [id number]  HPI Is here with sinus symptoms - 2-3 weeks A lot of facial pain - especially on the R side  Pain and pressure - occ refers to the back of her head Runny and stuffy nose and post nasal drip - yellow Fells feverish at night - chills and then sweats Some cough also - this is productive   Takes a medicine from [facility] for sinus headache off the shelf   Patient Active Problem List  Diagnosis  . DIABETES MELLITUS, TYPE II  . HYPERLIPIDEMIA  . GOUT  . ANEMIA, OTHER UNSPEC  . HYPERTENSION  . ALLERGIC RHINITIS  . GERD  . DIVERTICULOSIS, COLON  . PERSONAL HX COLONIC POLYPS  . ANEMIA-IRON DEFICIENCY  . UNSPECIFIED PAROXYSMAL TACHYCARDIA  . Osteoarthritis, knee   Past Medical History  Diagnosis Date  . Allergic rhinitis   . Diabetes mellitus     Type II  . Diverticulosis of colon   . GERD (gastroesophageal reflux disease)   . Hyperlipidemia   . Hypertension   . Hx of colonic polyps   . Gout   . Hemorrhoids   . Family history of colon cancer father  . Anemia   . Tachycardia, paroxysmal 09-2010   Past Surgical History  Procedure Date  . Abdominal hysterectomy 1972  . Cesarean section   . Breast lumpectomy 02/07    Left  ([place])  Benign  . Breast biopsy 02/08    left- Benign    History  Substance Use Topics  . Smoking status: Never Smoker   . Smokeless tobacco: Never Used  . Alcohol Use: No   Family History  Problem Relation Age of Onset  . Kidney disease Mother   . Colon cancer Father   . Heart disease Sister     CVA  . Lung cancer Brother   . Breast cancer      neice   Allergies  Allergen Reactions  . Sulfonamide Derivatives     REACTION: eyes red and blurry   Current Outpatient Prescriptions on File Prior to Visit  Medication Sig Dispense Refill  . acetaminophen (ARTHRITIS PAIN RELIEF) 650 MG CR tablet Take 650 mg by mouth every 8 (eight) hours as needed.         Marland Kitchen aspirin 81 MG EC tablet Take 81 mg by mouth daily.        . bisoprolol (ZEBETA) 5 MG tablet TAKE 1 TABLET BY MOUTH DAILY  30 tablet  12  . colchicine 0.6 MG tablet Take 1 tablet (0.6 mg total) by mouth 2 (two) times daily as needed. for gout flare  60 tablet  1  . ferrous sulfate 325 (65 FE) MG tablet TAKE 1 TABLET BY MOUTH THREE TIMES A DAY  90 tablet  5  . glucose blood (ONE TOUCH ULTRA TEST) test strip 1 each by Other route as needed. Use as instructed to check blood sugar once daily dx:250.00  100 each  11  . insulin glargine (LANTUS SOLOSTAR) 100 UNIT/ML injection Inject 15 Units into the skin at bedtime.  15 mL  11  . Insulin Pen Needle (B-D ULTRAFINE III SHORT PEN) 31G X 8 MM MISC Use as directed.  100 each  11  . Lancets (ONETOUCH ULTRASOFT) lancets 1 each by Other route as needed. Use as instructed to check blood sugar once daily dx:250.00  100 each  11  .  metFORMIN (GLUCOPHAGE) 1000 MG tablet 1,000 mg 2 (two) times daily.       . Multiple Vitamin (MULTIVITAMIN) tablet Take 1 tablet by mouth daily.        . pantoprazole (PROTONIX) 40 MG tablet TAKE 1 TABLET DAILY  90 tablet  2  . ramipril (ALTACE) 10 MG capsule TAKE 1 CAPSULE DAILY  90 capsule  2  . simvastatin (ZOCOR) 20 MG tablet TAKE 1 TABLET AT BEDTIME  90 tablet  2  . traMADol (ULTRAM) 50 MG tablet TAKE 1/2 TO 1 TABLET BY MOUTH 3 TIMES DAILY AS NEEDED  60 tablet  0       Review of Systems Review of Systems  Constitutional: Negative for, appetite change,  and unexpected weight change.  Eyes: Negative for pain and visual disturbance.  ENT pos for congestion/ sinus pain/ St Respiratory: Negative for wheeze and shortness of breath.   Cardiovascular: Negative for cp or palpitations    Gastrointestinal: Negative for nausea, diarrhea and constipation.  Genitourinary: Negative for urgency and frequency.  Skin: Negative for pallor or rash   Neurological: Negative for weakness, light-headedness, numbness and headaches.    Hematological: Negative for adenopathy. Does not bruise/bleed easily.  Psychiatric/Behavioral: Negative for dysphoric mood. The patient is not nervous/anxious.         Objective:   Physical Exam  Constitutional: She appears well-developed and well-nourished. No distress.  HENT:  Head: Normocephalic and atraumatic.  Right Ear: External ear normal.  Left Ear: External ear normal.  Mouth/Throat: Oropharynx is clear and moist. No oropharyngeal exudate.       Nares are injected and congested  bilat maxillary and ethmoid sinus tenderness worse on the R  Neck: Neck supple.  Cardiovascular: Normal rate and regular rhythm.   Pulmonary/Chest: Effort normal and breath sounds normal. No respiratory distress. She has no wheezes. She has no rales.  Lymphadenopathy:    She has no cervical adenopathy.  Neurological: She is alert. No cranial nerve deficit.  Skin: Skin is warm and dry. No rash noted.  Psychiatric: She has a normal mood and affect.          Assessment & Plan:

## 2012-06-25 NOTE — Patient Instructions (Addendum)
Try mucinex DM for both nasal and chest congestion and cough  Nasal saline spray is also helpful  Take the augmentin as directed for sinus infection  Try to get rest and drink fluids

## 2012-06-25 NOTE — Telephone Encounter (Signed)
Patient Information:  Caller Name: Tinslee  Phone: [phone]  Patient: Angelica Murphy, Angelica Murphy  Gender: Female  DOB: 05/02/1936  Age: 76 Years  PCP: [facility]man Ab[facility])   Symptoms  Reason For Call & Symptoms: Severe headache across forehead; has hx  Reviewed Health History In EMR: Yes  Reviewed Medications In EMR: Yes  Reviewed Allergies In EMR: Yes  Date of Onset of Symptoms: 06/11/2012  Treatments Tried: Pheynylphrine, acetaminophen  Treatments Tried Worked: Yes  Guideline(s) Used:  Sinus Pain and Congestion  Disposition Per Guideline:   See Today or Tomorrow in Office  Reason For Disposition Reached:   Sinus congestion (pressure, fullness) present > 10 days  Advice Given:  Pain and Fever Medicines:  For pain or fever relief, take either acetaminophen or ibuprofen.  Caution - Nasal Decongestants:  Do not take these medications if you have high blood pressure, heart disease, prostate problems, or an overactive thyroid.  Hydration:  Drink plenty of liquids (6-8 glasses of water daily). If the air in your home is dry, use a cool mist humidifier  Call Back If:   You become worse.  Office Follow Up:  Does the office need to follow up with this patient?: No  Instructions For The Office: N/A

## 2012-06-27 ENCOUNTER — Telehealth: Payer: Self-pay | Admitting: Hematology and Oncology

## 2012-06-27 NOTE — Telephone Encounter (Signed)
s/w pt and she is aware of  her appts  aom 

## 2012-06-28 NOTE — Assessment & Plan Note (Signed)
After several weeks of uri tx with augmentin  Disc symptomatic care - see instructions on AVS  Update if not starting to improve in a week or if worsening

## 2012-07-25 ENCOUNTER — Other Ambulatory Visit: Payer: Self-pay | Admitting: *Deleted

## 2012-07-25 DIAGNOSIS — D539 Nutritional anemia, unspecified: Secondary | ICD-10-CM

## 2012-07-25 MED ORDER — TRAMADOL HCL 50 MG PO TABS: ORAL_TABLET | ORAL | Status: DC

## 2012-07-25 MED ORDER — PANTOPRAZOLE SODIUM 40 MG PO TBEC: DELAYED_RELEASE_TABLET | ORAL | Status: DC

## 2012-07-25 MED ORDER — METFORMIN HCL 1000 MG PO TABS: 1000.0000 mg | ORAL_TABLET | Freq: Two times a day (BID) | ORAL | Status: DC

## 2012-07-25 MED ORDER — SIMVASTATIN 20 MG PO TABS: ORAL_TABLET | ORAL | Status: DC

## 2012-07-25 NOTE — Telephone Encounter (Signed)
Okay #[phone] x [phone] 

## 2012-07-25 NOTE — Telephone Encounter (Signed)
Requesting 3 month supply for mail order

## 2012-07-25 NOTE — Telephone Encounter (Signed)
rx sent to pharmacy by e-script  

## 2012-08-03 ENCOUNTER — Other Ambulatory Visit: Payer: Self-pay | Admitting: *Deleted

## 2012-08-03 MED ORDER — BISOPROLOL FUMARATE 5 MG PO TABS: 5.0000 mg | ORAL_TABLET | Freq: Every day | ORAL | Status: DC

## 2012-08-03 MED ORDER — INSULIN GLARGINE 100 UNIT/ML ~~LOC~~ SOLN: 15.0000 [IU] | Freq: Every day | SUBCUTANEOUS | Status: DC

## 2012-08-06 ENCOUNTER — Other Ambulatory Visit: Payer: Self-pay | Admitting: Internal Medicine

## 2012-08-11 ENCOUNTER — Encounter: Payer: Self-pay | Admitting: *Deleted

## 2012-08-21 ENCOUNTER — Ambulatory Visit (INDEPENDENT_AMBULATORY_CARE_PROVIDER_SITE_OTHER): Payer: Medicare Other | Admitting: Internal Medicine

## 2012-08-21 ENCOUNTER — Encounter: Payer: Self-pay | Admitting: Internal Medicine

## 2012-08-21 VITALS — BP 132/84 | HR 72 | Temp 98.6°F | Wt 137.0 lb

## 2012-08-21 DIAGNOSIS — D509 Iron deficiency anemia, unspecified: Secondary | ICD-10-CM

## 2012-08-21 DIAGNOSIS — I10 Essential (primary) hypertension: Secondary | ICD-10-CM

## 2012-08-21 DIAGNOSIS — Z23 Encounter for immunization: Secondary | ICD-10-CM

## 2012-08-21 DIAGNOSIS — E119 Type 2 diabetes mellitus without complications: Secondary | ICD-10-CM

## 2012-08-21 DIAGNOSIS — J309 Allergic rhinitis, unspecified: Secondary | ICD-10-CM

## 2012-08-21 DIAGNOSIS — E785 Hyperlipidemia, unspecified: Secondary | ICD-10-CM

## 2012-08-21 DIAGNOSIS — M109 Gout, unspecified: Secondary | ICD-10-CM

## 2012-08-21 LAB — BASIC METABOLIC PANEL
BUN: 11 mg/dL (ref 6–23)
CO2: 29 mEq/L (ref 19–32)
Calcium: 9.3 mg/dL (ref 8.4–10.5)
Creatinine, Ser: 0.7 mg/dL (ref 0.4–1.2)
GFR: 101.82 mL/min (ref >60.00)
Glucose, Bld: 120 mg/dL — ABNORMAL HIGH (ref 70–99)
Sodium: 139 mEq/L (ref 135–145)

## 2012-08-21 LAB — HEPATIC FUNCTION PANEL
ALT: 14 U/L (ref 0–35)
Albumin: 3.6 g/dL (ref 3.5–5.2)
Total Protein: 7.7 g/dL (ref 6.0–8.3)

## 2012-08-21 LAB — LIPID PANEL
Cholesterol: 128 mg/dL (ref 0–200)
HDL: 38.4 mg/dL — ABNORMAL LOW (ref >39.00)
LDL Cholesterol: 74 mg/dL (ref 0–99)
Triglycerides: 76 mg/dL (ref 0.0–149.0)
VLDL: 15.2 mg/dL (ref 0.0–40.0)

## 2012-08-21 LAB — HEMOGLOBIN A1C: Hgb A1c MFr Bld: 6.8 % — ABNORMAL HIGH (ref 4.6–6.5)

## 2012-08-21 MED ORDER — FERROUS SULFATE 325 (65 FE) MG PO TABS: 325.0000 mg | ORAL_TABLET | Freq: Every day | ORAL | Status: DC

## 2012-08-21 NOTE — Progress Notes (Signed)
Subjective:    Patient ID: Angelica Murphy, female    DOB: October 21, 1935, 77 y.o.   MRN: [id number]  HPI Doing "good" Feels like her sinuses are clearing somewhat--but did have bad attack this weekend Sneezing and drip Discussed antihistamines (had been taking PE and mucinex)  Getting new hematologist--Dr Odogwu is leaving Continues on the iron  Checks sugars once a day--fasting 120-130 in general---occ lower No hypoglycemic reactions  No chest pain No SOB [facility]  Gout has been quiet No stomach problems--still uses the protonix every day  Hasn't needed pain pills  Feels a vibration in left thigh Once a week for a minute or two No weakness  Current Outpatient Prescriptions on File Prior to Visit  Medication Sig Dispense Refill  . acetaminophen (ARTHRITIS PAIN RELIEF) 650 MG CR tablet Take 650 mg by mouth every 8 (eight) hours as needed.        Marland Kitchen aspirin 81 MG EC tablet Take 81 mg by mouth daily.        . bisoprolol (ZEBETA) 5 MG tablet Take 1 tablet (5 mg total) by mouth daily.  90 tablet  3  . colchicine 0.6 MG tablet Take 1 tablet (0.6 mg total) by mouth 2 (two) times daily as needed. for gout flare  60 tablet  1  . glucose blood (ONE TOUCH ULTRA TEST) test strip 1 each by Other route as needed. Use as instructed to check blood sugar once daily dx:250.00  100 each  11  . insulin glargine (LANTUS SOLOSTAR) 100 UNIT/ML injection Inject 15 Units into the skin at bedtime.  15 pen  3  . Insulin Pen Needle (B-D ULTRAFINE III SHORT PEN) 31G X 8 MM MISC Use as directed.  100 each  11  . Lancets (ONETOUCH ULTRASOFT) lancets 1 each by Other route as needed. Use as instructed to check blood sugar once daily dx:250.00  100 each  11  . loratadine (CLARITIN) 10 MG tablet Take 10-20 mg by mouth daily as needed.      . metFORMIN (GLUCOPHAGE) 1000 MG tablet Take 1 tablet (1,000 mg total) by mouth 2 (two) times daily with a meal.  180 tablet  1  . Multiple Vitamin (MULTIVITAMIN)  tablet Take 1 tablet by mouth daily.        . pantoprazole (PROTONIX) 40 MG tablet TAKE 1 TABLET DAILY  90 tablet  1  . ramipril (ALTACE) 10 MG capsule TAKE 1 CAPSULE DAILY  90 capsule  2  . simvastatin (ZOCOR) 20 MG tablet TAKE 1 TABLET AT BEDTIME  90 tablet  1  . traMADol (ULTRAM) 50 MG tablet TAKE 1/2 TO 1 TABLET BY MOUTH 3 TIMES DAILY AS NEEDED  270 tablet  0  . ferrous sulfate 325 (65 FE) MG tablet Take 1 tablet (325 mg total) by mouth daily with breakfast.  90 tablet  5    Allergies  Allergen Reactions  . Sulfonamide Derivatives     REACTION: eyes red and blurry    Past Medical History  Diagnosis Date  . Allergic rhinitis   . Diabetes mellitus     Type II  . Diverticulosis of colon   . GERD (gastroesophageal reflux disease)   . Hyperlipidemia   . Hypertension   . Hx of colonic polyps   . Gout   . Hemorrhoids   . Family history of colon cancer father  . Anemia   . Tachycardia, paroxysmal 09-2010    Past Surgical History  Procedure  Date  . Abdominal hysterectomy 1972  . Cesarean section   . Breast lumpectomy 02/07    Left  (Young)  Benign  . Breast biopsy 02/08    left- Benign     Family History  Problem Relation Age of Onset  . Kidney disease Mother   . Colon cancer Father   . Heart disease Sister     CVA  . Lung cancer Brother   . Breast cancer      neice    History   Social History  . Marital Status: Married    Spouse Name: N/A    Number of Children: 3  . Years of Education: N/A   Occupational History  . 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 Smoking status: Never Smoker   . Smokeless tobacco: Never Used  . Alcohol Use: No  . Drug Use: No  . Sexually Active: Not on file   Other Topics Concern  . Not on file   Social History Narrative  . No narrative on file      Review of Systems Weight is down a few pounds Sleeping okay lately    Objective:   Physical Exam    Constitutional: She appears well-developed and well-nourished. No distress.  Neck: Normal range of motion. Neck supple. No thyromegaly present.  Cardiovascular: Normal rate, regular rhythm, normal heart sounds and intact distal pulses.  Exam reveals no gallop.   No murmur heard. Pulmonary/Chest: Effort normal and breath sounds normal. No respiratory distress. She has no wheezes. She has no rales.  Musculoskeletal: She exhibits no edema and no tenderness.  Lymphadenopathy:    She has no cervical adenopathy.  Skin:       No foot lesions  Psychiatric: She has a normal mood and affect. Her behavior is normal.          Assessment & Plan:

## 2012-08-21 NOTE — Assessment & Plan Note (Signed)
No problems with the med Will check labs 

## 2012-08-21 NOTE — Assessment & Plan Note (Signed)
Discussed trying antihistamines instead of other products

## 2012-08-21 NOTE — Patient Instructions (Signed)
Please try loratadine 10mg   1-2 tabs daily for allergy attacks.

## 2012-08-21 NOTE — Assessment & Plan Note (Signed)
Elevated with first check at 160/70 Then better after a few minutes No changes needed  BP Readings from Last 3 Encounters:  08/21/12 132/84  06/25/12 150/68  06/21/12 177/71

## 2012-08-21 NOTE — Assessment & Plan Note (Signed)
Seems to still have good control ?early neuropathy with vibration in legs---observe only Check labs

## 2012-08-21 NOTE — Assessment & Plan Note (Signed)
Continues with the hematologist

## 2012-08-22 ENCOUNTER — Telehealth: Payer: Self-pay | Admitting: Nurse Practitioner

## 2012-08-22 ENCOUNTER — Encounter: Payer: Self-pay | Admitting: *Deleted

## 2012-08-22 NOTE — Telephone Encounter (Signed)
I notified Angelica Murphy the bone marrow scheduled on 08/22/2012 will be canceled. She will be contacted in the near future with an appointment to establish care with another physician in this practice.

## 2012-08-30 ENCOUNTER — Telehealth: Payer: Self-pay

## 2012-08-30 NOTE — Telephone Encounter (Signed)
Spoke with patient and advised results   

## 2012-08-30 NOTE — Telephone Encounter (Signed)
Pt left v/m requesting call back re; lab results.

## 2012-08-30 NOTE — Telephone Encounter (Signed)
Pt does not need med refilled now but wants mail order pharmacy changed to [facility]. Done.

## 2012-09-08 ENCOUNTER — Telehealth: Payer: Self-pay | Admitting: Oncology

## 2012-09-08 ENCOUNTER — Encounter: Payer: Self-pay | Admitting: Oncology

## 2012-09-08 NOTE — Telephone Encounter (Signed)
Pt has already been r/s....mailed pt letter and appt schedule for April...for pt of Dr. LO////reassigned to Dr. Reece Agar

## 2012-09-25 ENCOUNTER — Other Ambulatory Visit: Payer: Self-pay | Admitting: Internal Medicine

## 2012-09-25 DIAGNOSIS — Z1231 Encounter for screening mammogram for malignant neoplasm of breast: Secondary | ICD-10-CM

## 2012-10-02 ENCOUNTER — Other Ambulatory Visit: Payer: Self-pay | Admitting: Internal Medicine

## 2012-10-23 ENCOUNTER — Ambulatory Visit: Payer: Medicare Other

## 2012-11-19 ENCOUNTER — Ambulatory Visit (HOSPITAL_BASED_OUTPATIENT_CLINIC_OR_DEPARTMENT_OTHER): Payer: Medicare Other

## 2012-11-19 ENCOUNTER — Other Ambulatory Visit: Payer: Self-pay | Admitting: Oncology

## 2012-11-19 ENCOUNTER — Other Ambulatory Visit (HOSPITAL_BASED_OUTPATIENT_CLINIC_OR_DEPARTMENT_OTHER): Payer: Medicare Other | Admitting: Lab

## 2012-11-19 VITALS — BP 157/81 | HR 77 | Temp 98.1°F | Resp 18

## 2012-11-19 DIAGNOSIS — D509 Iron deficiency anemia, unspecified: Secondary | ICD-10-CM

## 2012-11-19 DIAGNOSIS — I479 Paroxysmal tachycardia, unspecified: Secondary | ICD-10-CM

## 2012-11-19 DIAGNOSIS — D539 Nutritional anemia, unspecified: Secondary | ICD-10-CM

## 2012-11-19 LAB — CBC WITH DIFFERENTIAL/PLATELET
Basophils Absolute: 0 10*3/uL (ref 0.0–0.1)
EOS%: 0.7 % (ref 0.0–7.0)
Eosinophils Absolute: 0.1 10*3/uL (ref 0.0–0.5)
HGB: 7.7 g/dL — ABNORMAL LOW (ref 11.6–15.9)
NEUT#: 6.5 10*3/uL (ref 1.5–6.5)
RBC: 3.51 10*6/uL — ABNORMAL LOW (ref 3.70–5.45)
RDW: 20.1 % — ABNORMAL HIGH (ref 11.2–14.5)
lymph#: 2.3 10*3/uL (ref 0.9–3.3)
nRBC: 0 % (ref 0–0)

## 2012-11-19 LAB — COMPREHENSIVE METABOLIC PANEL (CC13)
ALT: 6 U/L (ref 0–55)
Albumin: 2.9 g/dL — ABNORMAL LOW (ref 3.5–5.0)
Alkaline Phosphatase: 72 U/L (ref 40–150)
CO2: 25 mEq/L (ref 22–29)
Glucose: 100 mg/dl — ABNORMAL HIGH (ref 70–99)
Potassium: 3.8 mEq/L (ref 3.5–5.1)
Sodium: 140 mEq/L (ref 136–145)
Total Bilirubin: 0.61 mg/dL (ref 0.20–1.20)
Total Protein: 7.3 g/dL (ref 6.4–8.3)

## 2012-11-19 LAB — IRON AND TIBC: UIBC: 218 ug/dL (ref 125–400)

## 2012-11-19 MED ORDER — SODIUM CHLORIDE 0.9 % IJ SOLN
3.0000 mL | Freq: Once | INTRAMUSCULAR | Status: DC | PRN
  Filled 2012-11-19: qty 10

## 2012-11-19 MED ORDER — SODIUM CHLORIDE 0.9 % IV SOLN
1020.0000 mg | Freq: Once | INTRAVENOUS | Status: AC
  Administered 2012-11-19: 1020 mg via INTRAVENOUS
  Filled 2012-11-19: qty 34

## 2012-11-19 NOTE — Patient Instructions (Addendum)
Ferumoxytol injection What is this medicine? FERUMOXYTOL is an iron complex. Iron is used to make healthy red blood cells, which carry oxygen and nutrients throughout the body. This medicine is used to treat iron deficiency anemia in people with chronic kidney disease. This medicine may be used for other purposes; ask your health care provider or pharmacist if you have questions. What should I tell my health care provider before I take this medicine? They need to know if you have any of these conditions: -anemia not caused by low iron levels -high levels of iron in the blood -magnetic resonance imaging (MRI) test scheduled -an unusual or allergic reaction to iron, other medicines, foods, dyes, or preservatives -pregnant or trying to get pregnant -breast-feeding How should I use this medicine? This medicine is for infusion into a vein. It is given by a health care professional in a hospital or clinic setting. Talk to your pediatrician regarding the use of this medicine in children. Special care may be needed. Overdosage: If you think you've taken too much of this medicine contact a poison control center or emergency room at once. Overdosage: If you think you have taken too much of this medicine contact a poison control center or emergency room at once. NOTE: This medicine is only for you. Do not share this medicine with others. What if I miss a dose? It is important not to miss your dose. Call your doctor or health care professional if you are unable to keep an appointment. What may interact with this medicine? This medicine may interact with the following medications: -other iron products This list may not describe all possible interactions. Give your health care provider a list of all the medicines, herbs, non-prescription drugs, or dietary supplements you use. Also tell them if you smoke, drink alcohol, or use illegal drugs. Some items may interact with your medicine. What should I watch  for while using this medicine? Visit your doctor or healthcare professional regularly. Tell your doctor or healthcare professional if your symptoms do not start to get better or if they get worse. You may need blood work done while you are taking this medicine. You may need to follow a special diet. Talk to your doctor. Foods that contain iron include: whole grains/cereals, dried fruits, beans, or peas, leafy green vegetables, and organ meats (liver, kidney). What side effects may I notice from receiving this medicine? Side effects that you should report to your doctor or health care professional as soon as possible: -allergic reactions like skin rash, itching or hives, swelling of the face, lips, or tongue -breathing problems -changes in blood pressure -feeling faint or lightheaded, falls -fever or chills -flushing, sweating, or hot feelings -swelling of the ankles or feet Side effects that usually do not require medical attention (Report these to your doctor or health care professional if they continue or are bothersome.): -diarrhea -headache -nausea, vomiting -stomach pain This list may not describe all possible side effects. Call your doctor for medical advice about side effects. You may report side effects to FDA at 1-800-FDA-1088. Where should I keep my medicine? This drug is given in a hospital or clinic and will not be stored at home. NOTE: This sheet is a summary. It may not cover all possible information. If you have questions about this medicine, talk to your doctor, pharmacist, or health care provider.  2012, [employer] Standard. (04/16/2008 9:48:25 PM) 

## 2012-11-21 ENCOUNTER — Ambulatory Visit: Payer: Medicare Other | Admitting: Hematology and Oncology

## 2012-11-22 ENCOUNTER — Ambulatory Visit
Admission: RE | Admit: 2012-11-22 | Discharge: 2012-11-22 | Disposition: A | Discharge status: Home / Self Care | Payer: Medicare Other | Source: Ambulatory Visit | Attending: Internal Medicine | Admitting: Internal Medicine

## 2012-11-22 DIAGNOSIS — Z1231 Encounter for screening mammogram for malignant neoplasm of breast: Secondary | ICD-10-CM

## 2012-11-26 ENCOUNTER — Ambulatory Visit (HOSPITAL_BASED_OUTPATIENT_CLINIC_OR_DEPARTMENT_OTHER): Payer: Medicare Other | Admitting: Oncology

## 2012-11-26 ENCOUNTER — Telehealth: Payer: Self-pay | Admitting: Oncology

## 2012-11-26 VITALS — BP 169/80 | HR 88 | Temp 97.1°F | Resp 18 | Ht 63.0 in | Wt 134.4 lb

## 2012-11-26 DIAGNOSIS — D509 Iron deficiency anemia, unspecified: Secondary | ICD-10-CM

## 2012-11-27 NOTE — Progress Notes (Signed)
Hematology and Oncology Follow Up Visit  Angelica Murphy [id number] 1936-07-26 77 y.o. 11/27/2012 8:23 PM   Principle Diagnosis: Encounter Diagnosis  Name Primary?  Gustavo Lah DEFICIENCY Yes     Interim History:  I will be assuming the hematology care for this patient since Dr. Val Eagle. 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 Despite an adequate trial of oral iron, her hemoglobin and ferritin levels continue to fall and she has required periodic parenteral iron infusions. On 01/20/2012 hemoglobin 10.9, iron 36, ferritin 78. On November 11, hemoglobin 9.3, iron 30, ferritin 26 and she received 1 g of IV iron. On 11/19/2012 hemoglobin 7.7, MCV 75, iron 20, ferritin 30 and she received another iron infusion.  She had upper endoscopy, capsule endoscopy, colonoscopy on 08/26/2010 which did not reveal a source of GI blood loss. She had moderate sigmoid diverticulosis. Nonbleeding internal hemorrhoids.  Medications: reviewed. She uses colchicine for acute gout attacks but has not had to use it in a long time.  Past medical history includes: Hypertension, GERD, type 2 diabetes on both oral agents and insulin and gout. Hyperlipidemia. Recent bilateral cataract surgery. No other major surgeries.  Social history. Retired Child psychotherapist. Husband accompanies her today. They have 3 children daughter age 60, son age 1, daughter age 14. She had 4 sisters all deceased. No family history of anemia.  Allergies:  Allergies  Allergen Reactions  . Sulfonamide Derivatives     REACTION: eyes red and blurry    Review of Systems: Constitutional:   No constitutional symptoms Respiratory: No cough or dyspnea Cardiovascular:  No chest pain or palpitations Gastrointestinal: No abdominal pain, no change in bowel habit, no hematochezia, no melena Genito-Urinary: No hematuria, no vaginal bleeding Musculoskeletal: No muscle, bone, or joint pain Neurologic:  No headache or change in vision Skin: No rash or ecchymosis Remaining ROS negative.  Physical Exam: Blood pressure 169/80, pulse 88, temperature 97.1 F (36.2 C), temperature source Oral, resp. rate 18, height 5\' 3"  (1.6 m), weight 134 lb 6.4 oz (60.963 kg). Wt Readings from Last 3 Encounters:  11/26/12 134 lb 6.4 oz (60.963 kg)  08/21/12 137 lb (62.143 kg)  06/25/12 140 lb (63.504 kg)     General appearance: Well-nourished African American woman HENNT: Pharynx no erythema or exudate Lymph nodes: No adenopathy Breasts: Lungs: Clear to auscultation resonant to percussion Heart: Regular rhythm Abdomen: Soft, nontender, no mass, no organomegaly Extremities: No edema, no calf tenderness Vascular: No cyanosis Neurologic: No focal deficit. There is moderate decrease in vibration sensation over the fingers. She reports no paresthesias Skin: No rash or ecchymosis  Lab Results: Lab Results  Component Value Date   WBC 9.6 11/19/2012   HGB 7.7* 11/19/2012   HCT 26.3* 11/19/2012   MCV 74.9* 11/19/2012   PLT 345 11/19/2012     Chemistry      Component Value Date/Time   NA 140 11/19/2012 1312   NA 139 08/21/2012 1138   K 3.8 11/19/2012 1312   K 4.2 08/21/2012 1138   CL 105 11/19/2012 1312   CL 104 08/21/2012 1138   CO2 25 11/19/2012 1312   CO2 29 08/21/2012 1138   BUN 9.2 11/19/2012 1312   BUN 11 08/21/2012 1138   CREATININE 0.7 11/19/2012 1312   CREATININE 0.7 08/21/2012 1138      Component Value Date/Time   CALCIUM 9.1 11/19/2012 1312   CALCIUM 9.3 08/21/2012 1138   ALKPHOS 72 11/19/2012 1312   ALKPHOS 66  08/21/2012 1138   AST 13 11/19/2012 1312   AST 15 08/21/2012 1138   ALT 6 11/19/2012 1312   ALT 14 08/21/2012 1138   BILITOT 0.61 11/19/2012 1312   BILITOT 0.9 08/21/2012 1138       Radiological Studies: Mm Digital Screening  11/22/2012  *RADIOLOGY REPORT*  Clinical Data: Screening.  DIGITAL BILATERAL SCREENING MAMMOGRAM WITH CAD  Comparison:  Previous exams.  FINDINGS:  ACR Breast  Density Category 2: There is a scattered fibroglandular pattern.  No suspicious masses, architectural distortion, or calcifications are present.  Images were processed with CAD.  IMPRESSION: No mammographic evidence of malignancy.  A result letter of this screening mammogram will be mailed directly to the patient.  RECOMMENDATION: Screening mammogram in one year. (Code:SM-B-01Y)  BI-she RADS CATEGORY 1:  Negative.   Original Report Authenticated By: Esperanza Heir, M.D.     Impression and Plan: Iron malabsorption anemia. Plan: I'm going to check CBCs and ferritin levels every 2 months. Iron infusions as indicated. I will see her again for a physical exam in 6 months.    CC:. Dr. Tillman Abide; Dr. Yancey Flemings   Levert Feinstein, MD 4/22/20148:23 PM

## 2012-12-03 ENCOUNTER — Other Ambulatory Visit: Payer: Self-pay | Admitting: Internal Medicine

## 2012-12-26 ENCOUNTER — Other Ambulatory Visit: Payer: Self-pay | Admitting: Internal Medicine

## 2013-02-14 ENCOUNTER — Other Ambulatory Visit: Payer: Self-pay

## 2013-02-25 ENCOUNTER — Other Ambulatory Visit: Payer: Self-pay | Admitting: *Deleted

## 2013-02-25 MED ORDER — INSULIN PEN NEEDLE 31G X 8 MM MISC: Status: DC

## 2013-02-26 ENCOUNTER — Ambulatory Visit (INDEPENDENT_AMBULATORY_CARE_PROVIDER_SITE_OTHER): Payer: Medicare Other | Admitting: Internal Medicine

## 2013-02-26 ENCOUNTER — Encounter: Payer: Self-pay | Admitting: Internal Medicine

## 2013-02-26 VITALS — BP 148/80 | HR 77 | Temp 98.1°F | Wt 137.0 lb

## 2013-02-26 DIAGNOSIS — E785 Hyperlipidemia, unspecified: Secondary | ICD-10-CM

## 2013-02-26 DIAGNOSIS — Z0000 Encounter for general adult medical examination without abnormal findings: Secondary | ICD-10-CM

## 2013-02-26 DIAGNOSIS — E119 Type 2 diabetes mellitus without complications: Secondary | ICD-10-CM

## 2013-02-26 DIAGNOSIS — I10 Essential (primary) hypertension: Secondary | ICD-10-CM

## 2013-02-26 LAB — HEMOGLOBIN A1C: Hgb A1c MFr Bld: 7.1 % — ABNORMAL HIGH (ref 4.6–6.5)

## 2013-02-26 NOTE — Assessment & Plan Note (Signed)
Doing well UTD on imms and screening Reconsider mammos after 77

## 2013-02-26 NOTE — Assessment & Plan Note (Signed)
BP Readings from Last 3 Encounters:  02/26/13 148/80  11/26/12 169/80  11/19/12 157/81   Better today Given age--will not increase meds

## 2013-02-26 NOTE — Progress Notes (Signed)
Subjective:    Patient ID: Angelica Murphy, female    DOB: 03/13/1936, 77 y.o.   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 acetaminophen (ARTHRITIS PAIN RELIEF) 650 MG CR tablet Take 650 mg by mouth every 8 (eight) hours as needed.        Marland Kitchen aspirin 81 MG EC tablet Take 81 mg by mouth daily.        . bisoprolol (ZEBETA) 5 MG tablet Take 1 tablet (5 mg total) by mouth daily.  90 tablet  3  . colchicine 0.6 MG tablet Take 1 tablet (0.6 mg total) by mouth 2 (two) times daily as needed. for gout flare  60 tablet  1  . insulin glargine (LANTUS SOLOSTAR) 100 UNIT/ML injection Inject 15 Units into the skin at bedtime.  15 pen  3  . Insulin Pen Needle (B-D ULTRAFINE III SHORT PEN) 31G X 8 MM MISC Use as directed  100 each  3  . Lancets (ONETOUCH ULTRASOFT) lancets USE AS DIRECTED  100 each  1  . loratadine (CLARITIN) 10 MG tablet Take 10-20 mg by mouth daily as needed.      . metFORMIN (GLUCOPHAGE) 1000 MG tablet Take 1 tablet by mouth  twice a day with a meal  180 tablet  3  . Multiple Vitamin (MULTIVITAMIN) tablet Take 1 tablet by mouth daily.        . ONE TOUCH ULTRA TEST test strip USE ONCE A DAY DX. 250.00  100 each  7  . pantoprazole (PROTONIX) 40 MG tablet Take 1 tablet by mouth  daily  90 tablet  3  . ramipril (ALTACE) 10 MG capsule Take 1 capsule by mouth  daily  90 capsule  3  . simvastatin (ZOCOR) 20 MG tablet Take 1 tablet by mouth  every night at bedtime  90 tablet  3  . traMADol (ULTRAM) 50 MG tablet TAKE 1/2 TO 1 TABLET BY MOUTH 3 TIMES DAILY AS NEEDED  270  tablet  0   No current facility-administered medications on file prior to visit.    Allergies  Allergen Reactions  . Sulfonamide Derivatives     REACTION: eyes red and blurry    Past Medical History  Diagnosis Date  . Allergic rhinitis   . Diabetes mellitus     Type II  . Diverticulosis of colon   . GERD (gastroesophageal reflux disease)   . Hyperlipidemia   . Hypertension   . Hx of colonic polyps   . Gout   . Hemorrhoids   . Family history of colon cancer father  . Anemia   . Tachycardia, paroxysmal 09-2010    Past Surgical History  Procedure Laterality Date  . Abdominal hysterectomy  1972  . Cesarean section    . Breast lumpectomy  02/07    Left  (Young)  Benign  . Breast biopsy  02/08    left- Benign   . Cataract extraction, bilateral  February and March, 2014    Family History  Problem Relation Age of Onset  . Kidney disease Mother   .  Colon cancer Father   . Heart disease Sister     CVA  . Lung cancer Brother   . Breast cancer      neice    History   Social History  . Marital Status: Married    Spouse Name: N/A    Number of Children: 3  . Years of Education: N/A   Occupational History  . 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 Smoking status: Never Smoker   . Smokeless tobacco: Never Used  . Alcohol Use: No  . Drug Use: No  . Sexually Active: Not on file   Other Topics Concern  . Not on file   Social History Narrative   Has old living will   No formal health care POA-- but requests son, Leo Grosser   Would accept resuscitation but no prolonged ventilation   No feeding tube if cognitively unaware   Review of Systems  Constitutional: Negative for fatigue and unexpected weight change.       Wears seat belt  HENT: Positive for congestion and rhinorrhea. Negative for hearing loss, dental problem and tinnitus.        Regular with dentist  Eyes: Negative for visual disturbance.        Vision improved since cataracts Glasses for reading only  Respiratory: Negative for cough, chest tightness and shortness of breath.   Cardiovascular: Negative for chest pain, palpitations and leg swelling.  Gastrointestinal: Negative for nausea, vomiting, abdominal pain, constipation and blood in stool.       No heartburn  Endocrine: Negative for cold intolerance and heat intolerance.  Genitourinary: Negative for dysuria and difficulty urinating.       No incontinence problems  Musculoskeletal: Positive for arthralgias. Negative for back pain and joint swelling.       OTC meds help leg arthritis  Skin: Negative for rash.       No suspicous lesions  Allergic/Immunologic: Positive for environmental allergies. Negative for immunocompromised state.       Relief with claritin  Neurological: Positive for headaches. Negative for dizziness, syncope, weakness, light-headedness and numbness.       Some AM and PM headaches--- tylenol or ibuprofen helps  Hematological: Negative for adenopathy. Does not bruise/bleed easily.  Psychiatric/Behavioral: Negative for sleep disturbance and dysphoric mood. The patient is not nervous/anxious.        Objective:   Physical Exam  Constitutional: She is oriented to person, place, and time. She appears well-developed and well-nourished. No distress.  HENT:  Head: Normocephalic and atraumatic.  Right Ear: External ear normal.  Left Ear: External ear normal.  Mouth/Throat: Oropharynx is clear and moist. No oropharyngeal exudate.  Eyes: Conjunctivae and EOM are normal. Pupils are equal, round, and reactive to light.  Neck: Normal range of motion. Neck supple. No thyromegaly present.  Cardiovascular: Normal rate, regular rhythm, normal heart sounds and intact distal pulses.  Exam reveals no gallop.   No murmur heard. Pulmonary/Chest: Effort normal and breath sounds normal. No respiratory distress. She has no wheezes. She has no rales.  Abdominal: Soft. There is  no tenderness.  Genitourinary:  No masses or worrisome lesions in breasts  Musculoskeletal: She exhibits no edema and no tenderness.  Lymphadenopathy:    She has no cervical adenopathy.    She has no axillary adenopathy.  Neurological: She is alert and oriented to person, place, and time.  Skin: No rash noted. No erythema.  Psychiatric:  She has a normal mood and affect. Her behavior is normal.          Assessment & Plan:

## 2013-02-26 NOTE — Assessment & Plan Note (Signed)
No problems with the statin 

## 2013-02-26 NOTE — Assessment & Plan Note (Signed)
Seems to still have good control Will check A1c 

## 2013-04-03 ENCOUNTER — Other Ambulatory Visit: Payer: Self-pay | Admitting: Internal Medicine

## 2013-04-03 NOTE — Telephone Encounter (Signed)
rx sent to pharmacy by e-script  

## 2013-05-20 ENCOUNTER — Ambulatory Visit (HOSPITAL_BASED_OUTPATIENT_CLINIC_OR_DEPARTMENT_OTHER): Payer: Medicare Other

## 2013-05-20 ENCOUNTER — Telehealth: Payer: Self-pay | Admitting: Oncology

## 2013-05-20 ENCOUNTER — Other Ambulatory Visit (HOSPITAL_BASED_OUTPATIENT_CLINIC_OR_DEPARTMENT_OTHER): Payer: Medicare Other | Admitting: Lab

## 2013-05-20 ENCOUNTER — Ambulatory Visit (HOSPITAL_BASED_OUTPATIENT_CLINIC_OR_DEPARTMENT_OTHER): Payer: Medicare Other | Admitting: Oncology

## 2013-05-20 VITALS — BP 162/81 | HR 82 | Temp 97.1°F | Resp 18

## 2013-05-20 VITALS — BP 154/78 | HR 90 | Temp 97.0°F | Resp 20 | Ht 63.0 in | Wt 136.5 lb

## 2013-05-20 DIAGNOSIS — D508 Other iron deficiency anemias: Secondary | ICD-10-CM

## 2013-05-20 DIAGNOSIS — D509 Iron deficiency anemia, unspecified: Secondary | ICD-10-CM

## 2013-05-20 DIAGNOSIS — E119 Type 2 diabetes mellitus without complications: Secondary | ICD-10-CM

## 2013-05-20 DIAGNOSIS — K219 Gastro-esophageal reflux disease without esophagitis: Secondary | ICD-10-CM

## 2013-05-20 DIAGNOSIS — I10 Essential (primary) hypertension: Secondary | ICD-10-CM

## 2013-05-20 LAB — CBC WITH DIFFERENTIAL/PLATELET
Eosinophils Absolute: 0.1 10*3/uL (ref 0.0–0.5)
HCT: 24.5 % — ABNORMAL LOW (ref 34.8–46.6)
LYMPH%: 18.6 % (ref 14.0–49.7)
MCH: 19.9 pg — ABNORMAL LOW (ref 25.1–34.0)
MONO#: 0.7 10*3/uL (ref 0.1–0.9)
MONO%: 7.5 % (ref 0.0–14.0)
NEUT#: 7.2 10*3/uL — ABNORMAL HIGH (ref 1.5–6.5)
NEUT%: 72.5 % (ref 38.4–76.8)
Platelets: 375 10*3/uL (ref 145–400)
RBC: 3.79 10*6/uL (ref 3.70–5.45)
WBC: 9.9 10*3/uL (ref 3.9–10.3)
lymph#: 1.8 10*3/uL (ref 0.9–3.3)

## 2013-05-20 LAB — COMPREHENSIVE METABOLIC PANEL (CC13)
ALT: <6 U/L (ref 0–55)
AST: 8 U/L (ref 5–34)
Albumin: 3 g/dL — ABNORMAL LOW (ref 3.5–5.0)
Anion Gap: 9 mEq/L (ref 3–11)
CO2: 26 mEq/L (ref 22–29)
Calcium: 9.2 mg/dL (ref 8.4–10.4)
Chloride: 105 mEq/L (ref 98–109)
Creatinine: 0.8 mg/dL (ref 0.6–1.1)
Potassium: 3.7 mEq/L (ref 3.5–5.1)
Sodium: 140 mEq/L (ref 136–145)
Total Bilirubin: 0.58 mg/dL (ref 0.20–1.20)

## 2013-05-20 LAB — LACTATE DEHYDROGENASE (CC13): LDH: 169 U/L (ref 125–245)

## 2013-05-20 MED ORDER — SODIUM CHLORIDE 0.9 % IV SOLN
1020.0000 mg | Freq: Once | INTRAVENOUS | Status: AC
  Administered 2013-05-20: 1020 mg via INTRAVENOUS
  Filled 2013-05-20: qty 34

## 2013-05-20 NOTE — Patient Instructions (Signed)
Ferumoxytol injection Angelica Murphy)  What is this medicine? FERUMOXYTOL is an iron complex. Iron is used to make healthy red blood cells, which carry oxygen and nutrients throughout the body. This medicine is used to treat iron deficiency anemia in people with chronic kidney disease. This medicine may be used for other purposes; ask your health care provider or pharmacist if you have questions. What should I tell my health care provider before I take this medicine? They need to know if you have any of these conditions: -anemia not caused by low iron levels -high levels of iron in the blood -magnetic resonance imaging (MRI) test scheduled -an unusual or allergic reaction to iron, other medicines, foods, dyes, or preservatives -pregnant or trying to get pregnant -breast-feeding How should I use this medicine? This medicine is for infusion into a vein. It is given by a health care professional in a hospital or clinic setting. Talk to your pediatrician regarding the use of this medicine in children. Special care may be needed. Overdosage: If you think you've taken too much of this medicine contact a poison control center or emergency room at once. Overdosage: If you think you have taken too much of this medicine contact a poison control center or emergency room at once. NOTE: This medicine is only for you. Do not share this medicine with others. What if I miss a dose? It is important not to miss your dose. Call your doctor or health care professional if you are unable to keep an appointment. What may interact with this medicine? This medicine may interact with the following medications: -other iron products This list may not describe all possible interactions. Give your health care provider a list of all the medicines, herbs, non-prescription drugs, or dietary supplements you use. Also tell them if you smoke, drink alcohol, or use illegal drugs. Some items may interact with your medicine. What  should I watch for while using this medicine? Visit your doctor or healthcare professional regularly. Tell your doctor or healthcare professional if your symptoms do not start to get better or if they get worse. You may need blood work done while you are taking this medicine. You may need to follow a special diet. Talk to your doctor. Foods that contain iron include: whole grains/cereals, dried fruits, beans, or peas, leafy green vegetables, and organ meats (liver, kidney). What side effects may I notice from receiving this medicine? Side effects that you should report to your doctor or health care professional as soon as possible: -allergic reactions like skin rash, itching or hives, swelling of the face, lips, or tongue -breathing problems -changes in blood pressure -feeling faint or lightheaded, falls -fever or chills -flushing, sweating, or hot feelings -swelling of the ankles or feet Side effects that usually do not require medical attention (Report these to your doctor or health care professional if they continue or are bothersome.): -diarrhea -headache -nausea, vomiting -stomach pain This list may not describe all possible side effects. Call your doctor for medical advice about side effects. You may report side effects to FDA at 1-800-FDA-1088. Where should I keep my medicine? This drug is given in a hospital or clinic and will not be stored at home. NOTE: This sheet is a summary. It may not cover all possible information. If you have questions about this medicine, talk to your doctor, pharmacist, or health care provider.  2012, Elsevier/Gold Standard. (04/16/2008 9:48:25 PM)

## 2013-05-20 NOTE — Telephone Encounter (Signed)
Gave pt appt for lab every 2 months and ML with labs on April 2015

## 2013-05-20 NOTE — Progress Notes (Signed)
Hematology and Oncology Follow Up Visit  Angelica Murphy [id number] 24-Jul-1936 77 y.o. 05/20/2013 8:30 PM   Principle Diagnosis: Encounter Diagnosis  Name Primary?  Gustavo Lah DEFICIENCY Yes     Interim History:   Followup visit for this pleasant 77 year old woman with iron malabsorption anemia who has been getting intermittent iron infusions in our office. At time of her visit here in April, hemoglobin was 7.7, ferritin was 30 and she received 1020 mg of IV iron. Plan was to check blood counts every 2 months. Somehow or other this did not get scheduled. She comes back today 6 months later and hemoglobin is 7.5., MCV 65, Ferritin is 32. Hemoglobin seems disproportionately low the ferritin. Renal function is normal. LDH normal at 169, total bilirubin 0.6. Folic acid level was normal when checked back in April. Immunoglobulin levels were normal in January 2013 and there was no monoclonal protein on IFE. She has been feeling more tired lately which is no surprise given her hemoglobin.  Medications: reviewed  Allergies:  Allergies  Allergen Reactions  . Sulfonamide Derivatives     REACTION: eyes red and blurry    Review of Systems: Constitutional:  Increased fatigue. No anorexia or weight loss HEENT: No sore throat Respiratory: No cough or dyspnea Cardiovascular:  No chest pain or recent palpitations Gastrointestinal: No abdominal pain or change in bowel habit Genito-Urinary: Not questioned Musculoskeletal: No muscle bone or joint pain Neurologic: No headache or change in vision Skin: No rash or ecchymosis  Remaining ROS negative.    Physical Exam: Blood pressure 154/78, pulse 90, temperature 97 F (36.1 C), temperature source Oral, resp. rate 20, height 5\' 3"  (1.6 m), weight 136 lb 8 oz (61.916 kg). Wt Readings from Last 3 Encounters:  05/20/13 136 lb 8 oz (61.916 kg)  02/26/13 137 lb (62.143 kg)  11/26/12 134 lb 6.4 oz (60.963 kg)     General appearance: Thin  African American woman HENNT: Pharynx no erythema or exudate. No thyromegaly Lymph nodes: No cervical, supraclavicular, or axillary adenopathy Breasts: Lungs: Clear to auscultation resonant to percussion Heart: Regular rhythm no murmur Abdomen: Soft, nontender, no mass, no organomegaly Extremities: No edema, no calf tenderness Musculoskeletal: no joint deformities GU: Vascular: No carotid bruits, no cyanosis Neurologic: Motor strength 5 over 5, reflexes 1+ symmetric Skin: No rash or ecchymosis  Lab Results: CBC W/Diff    Component Value Date/Time   WBC 9.9 05/20/2013 1348   WBC 8.4 08/16/2011 1222   RBC 3.79 05/20/2013 1348   RBC 3.79* 08/16/2011 1222   HGB 7.5* 05/20/2013 1348   HGB 9.6* 08/16/2011 1222   HCT 24.5* 05/20/2013 1348   HCT 30.1* 08/16/2011 1222   PLT 375 05/20/2013 1348   PLT 326.0 08/16/2011 1222   MCV 64.7* 05/20/2013 1348   MCV 79.3 08/16/2011 1222   MCH 19.9* 05/20/2013 1348   MCHC 30.7* 05/20/2013 1348   MCHC 31.9 08/16/2011 1222   RDW 24.0* 05/20/2013 1348   RDW 22.5* 08/16/2011 1222   LYMPHSABS 1.8 05/20/2013 1348   LYMPHSABS 1.9 08/16/2011 1222   MONOABS 0.7 05/20/2013 1348   MONOABS 0.5 08/16/2011 1222   EOSABS 0.1 05/20/2013 1348   EOSABS 0.1 08/16/2011 1222   BASOSABS 0.1 05/20/2013 1348   BASOSABS 0.0 08/16/2011 1222     Chemistry      Component Value Date/Time   NA 140 05/20/2013 1348   NA 139 08/21/2012 1138   K 3.7 05/20/2013 1348   K 4.2 08/21/2012 1138  CL 105 11/19/2012 1312   CL 104 08/21/2012 1138   CO2 26 05/20/2013 1348   CO2 29 08/21/2012 1138   BUN 11.0 05/20/2013 1348   BUN 11 08/21/2012 1138   CREATININE 0.8 05/20/2013 1348   CREATININE 0.7 08/21/2012 1138      Component Value Date/Time   CALCIUM 9.2 05/20/2013 1348   CALCIUM 9.3 08/21/2012 1138   ALKPHOS 73 05/20/2013 1348   ALKPHOS 66 08/21/2012 1138   AST 8 05/20/2013 1348   AST 15 08/21/2012 1138   ALT <6 05/20/2013 1348   ALT 14 08/21/2012 1138   BILITOT 0.58 05/20/2013 1348   BILITOT  0.9 08/21/2012 1138       Impression: #1. Iron malabsorption anemia I will go ahead and give her IV iron in our office today 1020 mg. GI evaluation in the past otherwise unremarkable. I reordered standing labs for every two-month CBC and ferritin to track her progress.  #2. Type 2 diabetes on insulin and oral agents  #3. Essential hypertension  #4. Gout on chronic colchicine  #5. GERD  #6. Diverticulosis  #7. History of benign biopsy left breast 2000.  #8. History of paroxysmal tachycardia February 2012 no recurrence. Not on any cardiac meds except for low dose bisoprolol 5 mg  #9. Hyperlipidemia      Levert Feinstein, MD 10/13/20148:30 PM

## 2013-05-30 LAB — HM DIABETES EYE EXAM

## 2013-06-06 ENCOUNTER — Encounter: Payer: Self-pay | Admitting: Internal Medicine

## 2013-06-12 ENCOUNTER — Other Ambulatory Visit: Payer: Self-pay | Admitting: *Deleted

## 2013-06-12 MED ORDER — INSULIN PEN NEEDLE 31G X 8 MM MISC: Status: DC

## 2013-06-13 ENCOUNTER — Other Ambulatory Visit: Payer: Self-pay

## 2013-07-22 ENCOUNTER — Other Ambulatory Visit (HOSPITAL_BASED_OUTPATIENT_CLINIC_OR_DEPARTMENT_OTHER): Payer: Medicare Other

## 2013-07-22 DIAGNOSIS — D509 Iron deficiency anemia, unspecified: Secondary | ICD-10-CM

## 2013-07-22 LAB — CBC WITH DIFFERENTIAL/PLATELET
Eosinophils Absolute: 0.1 10*3/uL (ref 0.0–0.5)
HCT: 31.9 % — ABNORMAL LOW (ref 34.8–46.6)
LYMPH%: 19.8 % (ref 14.0–49.7)
MONO#: 0.7 10*3/uL (ref 0.1–0.9)
NEUT#: 6.4 10*3/uL (ref 1.5–6.5)
Platelets: 372 10*3/uL (ref 145–400)
RBC: 4.22 10*6/uL (ref 3.70–5.45)
WBC: 9.1 10*3/uL (ref 3.9–10.3)
lymph#: 1.8 10*3/uL (ref 0.9–3.3)

## 2013-07-22 LAB — FERRITIN CHCC: Ferritin: 287 ng/ml — ABNORMAL HIGH (ref 9–269)

## 2013-08-16 ENCOUNTER — Other Ambulatory Visit: Payer: Self-pay | Admitting: Internal Medicine

## 2013-08-19 ENCOUNTER — Other Ambulatory Visit: Payer: Medicare Other

## 2013-08-30 ENCOUNTER — Encounter: Payer: Self-pay | Admitting: Internal Medicine

## 2013-08-30 ENCOUNTER — Ambulatory Visit: Payer: Medicare Other | Admitting: Internal Medicine

## 2013-08-30 ENCOUNTER — Ambulatory Visit (INDEPENDENT_AMBULATORY_CARE_PROVIDER_SITE_OTHER): Payer: Medicare Other | Admitting: Internal Medicine

## 2013-08-30 VITALS — BP 148/70 | HR 84 | Temp 98.3°F | Ht 63.0 in | Wt 136.0 lb

## 2013-08-30 DIAGNOSIS — I10 Essential (primary) hypertension: Secondary | ICD-10-CM

## 2013-08-30 DIAGNOSIS — M159 Polyosteoarthritis, unspecified: Secondary | ICD-10-CM

## 2013-08-30 DIAGNOSIS — Z23 Encounter for immunization: Secondary | ICD-10-CM

## 2013-08-30 DIAGNOSIS — E119 Type 2 diabetes mellitus without complications: Secondary | ICD-10-CM

## 2013-08-30 DIAGNOSIS — E785 Hyperlipidemia, unspecified: Secondary | ICD-10-CM

## 2013-08-30 LAB — TSH: TSH: 2.89 u[IU]/mL (ref 0.35–5.50)

## 2013-08-30 LAB — MICROALBUMIN / CREATININE URINE RATIO
CREATININE, U: 323.3 mg/dL
MICROALB/CREAT RATIO: 0.6 mg/g (ref 0.0–30.0)
Microalb, Ur: 2.1 mg/dL — ABNORMAL HIGH (ref 0.0–1.9)

## 2013-08-30 LAB — T4, FREE: Free T4: 0.83 ng/dL (ref 0.60–1.60)

## 2013-08-30 LAB — HEMOGLOBIN A1C: Hgb A1c MFr Bld: 7 % — ABNORMAL HIGH (ref 4.6–6.5)

## 2013-08-30 LAB — COMPREHENSIVE METABOLIC PANEL
ALK PHOS: 72 U/L (ref 39–117)
ALT: 10 U/L (ref 0–35)
AST: 12 U/L (ref 0–37)
Albumin: 3.2 g/dL — ABNORMAL LOW (ref 3.5–5.2)
BILIRUBIN TOTAL: 0.6 mg/dL (ref 0.3–1.2)
BUN: 10 mg/dL (ref 6–23)
CO2: 27 mEq/L (ref 19–32)
Calcium: 9.2 mg/dL (ref 8.4–10.5)
Chloride: 102 mEq/L (ref 96–112)
Creatinine, Ser: 0.8 mg/dL (ref 0.4–1.2)
GFR: 86.17 mL/min (ref >60.00)
Glucose, Bld: 187 mg/dL — ABNORMAL HIGH (ref 70–99)
Potassium: 4.3 mEq/L (ref 3.5–5.1)
SODIUM: 137 meq/L (ref 135–145)
TOTAL PROTEIN: 7.4 g/dL (ref 6.0–8.3)

## 2013-08-30 LAB — LIPID PANEL
CHOLESTEROL: 110 mg/dL (ref 0–200)
HDL: 35.6 mg/dL — AB (ref >39.00)
LDL Cholesterol: 57 mg/dL (ref 0–99)
Total CHOL/HDL Ratio: 3
Triglycerides: 89 mg/dL (ref 0.0–149.0)
VLDL: 17.8 mg/dL (ref 0.0–40.0)

## 2013-08-30 LAB — HM DIABETES FOOT EXAM

## 2013-08-30 MED ORDER — TRAMADOL HCL 50 MG PO TABS: ORAL_TABLET | ORAL | Status: DC

## 2013-08-30 MED ORDER — METFORMIN HCL 1000 MG PO TABS: 1000.0000 mg | ORAL_TABLET | Freq: Two times a day (BID) | ORAL | Status: DC

## 2013-08-30 MED ORDER — INSULIN GLARGINE 100 UNIT/ML SOLOSTAR PEN: 15.0000 [IU] | PEN_INJECTOR | Freq: Every day | SUBCUTANEOUS | Status: DC

## 2013-08-30 MED ORDER — BISOPROLOL FUMARATE 5 MG PO TABS: 5.0000 mg | ORAL_TABLET | Freq: Every day | ORAL | Status: DC

## 2013-08-30 MED ORDER — SIMVASTATIN 20 MG PO TABS: 20.0000 mg | ORAL_TABLET | Freq: Every day | ORAL | Status: DC

## 2013-08-30 MED ORDER — RAMIPRIL 10 MG PO CAPS: 10.0000 mg | ORAL_CAPSULE | Freq: Every day | ORAL | Status: DC

## 2013-08-30 NOTE — Assessment & Plan Note (Signed)
No problems with meds 

## 2013-08-30 NOTE — Addendum Note (Signed)
Addended by: Despina Hidden on: 08/30/2013 12:20 PM   Modules accepted: Orders

## 2013-08-30 NOTE — Progress Notes (Signed)
Subjective:    Patient ID: Angelica Murphy, female    DOB: 1936-04-17, 78 y.o.   MRN: [id number]  HPI Here for follow up  Doing well in general Blood count is better---has gotten IV iron Feels better  Checks sugars daily in AM Usually 125-140 (rarely up to 160) No hypoglycemic reactions  Only mild arthritis pain Takes the tylenol but rarely the tramadol (#270 Rx canceled today)  No chest pain No SOB [facility] Slight edema without change (mild at end of the day)  Current Outpatient Prescriptions on File Prior to Visit  Medication Sig Dispense Refill  . acetaminophen (ARTHRITIS PAIN RELIEF) 650 MG CR tablet Take 650 mg by mouth every 8 (eight) hours as needed.        Marland Kitchen aspirin 81 MG EC tablet Take 81 mg by mouth daily.        . Insulin Pen Needle (B-D ULTRAFINE III SHORT PEN) 31G X 8 MM MISC Use as directed  100 each  6  . Lancets (ONETOUCH ULTRASOFT) lancets USE AS DIRECTED  100 each  1  . loratadine (CLARITIN) 10 MG tablet Take 10-20 mg by mouth daily as needed.      . Multiple Vitamin (MULTIVITAMIN) tablet Take 1 tablet by mouth daily.        . ONE TOUCH ULTRA TEST test strip USE ONCE A DAY DX. 250.00  100 each  7  . pantoprazole (PROTONIX) 40 MG tablet Take 1 tablet by mouth  daily  90 tablet  3   No current facility-administered medications on file prior to visit.    Allergies  Allergen Reactions  . Sulfonamide Derivatives     REACTION: eyes red and blurry    Past Medical History  Diagnosis Date  . Allergic rhinitis   . Diabetes mellitus     Type II  . Diverticulosis of colon   . GERD (gastroesophageal reflux disease)   . Hyperlipidemia   . Hypertension   . Hx of colonic polyps   . Gout   . Hemorrhoids   . Family history of colon cancer father  . Anemia   . Tachycardia, paroxysmal 09-2010    Past Surgical History  Procedure Laterality Date  . Abdominal hysterectomy  1972  . Cesarean section    . Breast lumpectomy  02/07    Left   (Young)  Benign  . Breast biopsy  02/08    left- Benign   . Cataract extraction, bilateral  February and March, 2014    Family History  Problem Relation Age of Onset  . Kidney disease Mother   . Colon cancer Father   . Heart disease Sister     CVA  . Lung cancer Brother   . Breast cancer      neice    History   Social History  . Marital Status: Married    Spouse Name: N/A    Number of Children: 3  . Years of Education: N/A   Occupational History  . 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 Volunteers at [employer] Topics  . Smoking status: Never Smoker   . Smokeless tobacco: Never Used  . Alcohol Use: No  . Drug Use: No  . Sexual Activity: Not on file   Other Topics Concern  . Not on file   Social History Narrative   Has old living will   No formal health care POA-- but requests son, Haywood Lasso  Would accept resuscitation but no prolonged ventilation   No feeding tube if cognitively unaware   Review of Systems Weight stable Never a good sleeper--- but not tired during the day No heartburn problems    Objective:   Physical Exam  Constitutional: She appears well-developed and well-nourished. No distress.  Neck: Normal range of motion. Neck supple. No thyromegaly present.  Cardiovascular: Normal rate, regular rhythm, normal heart sounds and intact distal pulses.  Exam reveals no gallop.   No murmur heard. Pulmonary/Chest: Effort normal and breath sounds normal. No respiratory distress. She has no wheezes. She has no rales.  Abdominal: Soft. There is no tenderness.  Musculoskeletal: She exhibits no edema and no tenderness.  Lymphadenopathy:    She has no cervical adenopathy.  Neurological:  Normal sensation on plantar feet  Skin:  No foot lesions Flat feet L>R  Psychiatric: She has a normal mood and affect. Her behavior is normal.          Assessment & Plan:

## 2013-08-30 NOTE — Assessment & Plan Note (Signed)
Mostly knees and hands Tylenol helps Rare tramadol

## 2013-08-30 NOTE — Assessment & Plan Note (Signed)
Seems to still have good control Can cut back on checking some

## 2013-08-30 NOTE — Assessment & Plan Note (Signed)
BP Readings from Last 3 Encounters:  08/30/13 148/70  05/20/13 162/81  05/20/13 154/78   Borderline up still Will check urine microal---increase meds if positive

## 2013-08-30 NOTE — Progress Notes (Signed)
Pre-visit discussion using our clinic review tool. No additional management support is needed unless otherwise documented below in the visit note.  

## 2013-09-12 ENCOUNTER — Other Ambulatory Visit: Payer: Self-pay

## 2013-09-12 DIAGNOSIS — Z1231 Encounter for screening mammogram for malignant neoplasm of breast: Secondary | ICD-10-CM

## 2013-09-23 ENCOUNTER — Other Ambulatory Visit: Payer: Medicare Other

## 2013-09-25 ENCOUNTER — Other Ambulatory Visit (HOSPITAL_BASED_OUTPATIENT_CLINIC_OR_DEPARTMENT_OTHER): Payer: Medicare Other

## 2013-09-25 DIAGNOSIS — D509 Iron deficiency anemia, unspecified: Secondary | ICD-10-CM

## 2013-09-25 LAB — FERRITIN CHCC: Ferritin: 154 ng/ml (ref 9–269)

## 2013-09-25 LAB — CBC WITH DIFFERENTIAL/PLATELET
BASO%: 0.7 % (ref 0.0–2.0)
BASOS ABS: 0.1 10*3/uL (ref 0.0–0.1)
EOS ABS: 0 10*3/uL (ref 0.0–0.5)
EOS%: 0.4 % (ref 0.0–7.0)
HEMATOCRIT: 30.9 % — AB (ref 34.8–46.6)
HEMOGLOBIN: 9.4 g/dL — AB (ref 11.6–15.9)
LYMPH#: 2.1 10*3/uL (ref 0.9–3.3)
LYMPH%: 18.6 % (ref 14.0–49.7)
MCH: 22.9 pg — ABNORMAL LOW (ref 25.1–34.0)
MCHC: 30.4 g/dL — ABNORMAL LOW (ref 31.5–36.0)
MCV: 75.4 fL — ABNORMAL LOW (ref 79.5–101.0)
MONO#: 1 10*3/uL — ABNORMAL HIGH (ref 0.1–0.9)
MONO%: 9.2 % (ref 0.0–14.0)
NEUT%: 71.1 % (ref 38.4–76.8)
NEUTROS ABS: 8.1 10*3/uL — AB (ref 1.5–6.5)
Platelets: 423 10*3/uL — ABNORMAL HIGH (ref 145–400)
RBC: 4.1 10*6/uL (ref 3.70–5.45)
RDW: 22.1 % — AB (ref 11.2–14.5)
WBC: 11.4 10*3/uL — ABNORMAL HIGH (ref 3.9–10.3)

## 2013-09-27 ENCOUNTER — Other Ambulatory Visit: Payer: Self-pay | Admitting: Oncology

## 2013-09-27 ENCOUNTER — Telehealth: Payer: Self-pay | Admitting: *Deleted

## 2013-09-27 ENCOUNTER — Telehealth: Payer: Self-pay | Admitting: Oncology

## 2013-09-27 DIAGNOSIS — D509 Iron deficiency anemia, unspecified: Secondary | ICD-10-CM

## 2013-09-27 NOTE — Telephone Encounter (Signed)
Notified pt of hgb & iron level per Dr. Beryle Beams & need to repeat lab in 1 mo instead of 2.  Informed scheduer would call her for appt.

## 2013-09-27 NOTE — Telephone Encounter (Signed)
Message copied by Jesse Fall on Fri Sep 27, 2013  1:47 PM ------      Message from: Annia Belt      Created: Fri Sep 27, 2013 12:50 PM       Call pt:  Hb 9.4 down from 10.1 in December but  Iron levels good.  I would like to repeat CBC in 1 month - not wait for 2 ------

## 2013-09-27 NOTE — Telephone Encounter (Signed)
s.w. pt and advised on March appt....pt ok and aware °

## 2013-10-21 ENCOUNTER — Other Ambulatory Visit: Payer: Medicare Other

## 2013-10-23 ENCOUNTER — Other Ambulatory Visit (HOSPITAL_BASED_OUTPATIENT_CLINIC_OR_DEPARTMENT_OTHER): Payer: Medicare Other

## 2013-10-23 DIAGNOSIS — D509 Iron deficiency anemia, unspecified: Secondary | ICD-10-CM

## 2013-10-23 LAB — COMPREHENSIVE METABOLIC PANEL (CC13)
ALT: 15 U/L (ref 0–55)
AST: 13 U/L (ref 5–34)
Albumin: 2.9 g/dL — ABNORMAL LOW (ref 3.5–5.0)
Alkaline Phosphatase: 98 U/L (ref 40–150)
Anion Gap: 10 meq/L (ref 3–11)
BUN: 9 mg/dL (ref 7.0–26.0)
CO2: 27 meq/L (ref 22–29)
Calcium: 9.9 mg/dL (ref 8.4–10.4)
Chloride: 101 meq/L (ref 98–109)
Creatinine: 0.7 mg/dL (ref 0.6–1.1)
Glucose: 69 mg/dL — ABNORMAL LOW (ref 70–140)
Potassium: 4.5 meq/L (ref 3.5–5.1)
Sodium: 137 meq/L (ref 136–145)
Total Bilirubin: 0.51 mg/dL (ref 0.20–1.20)
Total Protein: 7.7 g/dL (ref 6.4–8.3)

## 2013-10-23 LAB — CBC & DIFF AND RETIC
BASO%: 0.9 % (ref 0.0–2.0)
BASOS ABS: 0.1 10*3/uL (ref 0.0–0.1)
EOS%: 0.5 % (ref 0.0–7.0)
Eosinophils Absolute: 0.1 10*3/uL (ref 0.0–0.5)
HEMATOCRIT: 28.4 % — AB (ref 34.8–46.6)
HEMOGLOBIN: 8.7 g/dL — AB (ref 11.6–15.9)
Immature Retic Fract: 9.5 % (ref 1.60–10.00)
LYMPH%: 17.9 % (ref 14.0–49.7)
MCH: 21.8 pg — AB (ref 25.1–34.0)
MCHC: 30.7 g/dL — AB (ref 31.5–36.0)
MCV: 70.9 fL — ABNORMAL LOW (ref 79.5–101.0)
MONO#: 0.9 10*3/uL (ref 0.1–0.9)
MONO%: 8.3 % (ref 0.0–14.0)
NEUT#: 7.7 10*3/uL — ABNORMAL HIGH (ref 1.5–6.5)
NEUT%: 72.4 % (ref 38.4–76.8)
Platelets: 451 10*3/uL — ABNORMAL HIGH (ref 145–400)
RBC: 4 10*6/uL (ref 3.70–5.45)
RDW: 22.7 % — ABNORMAL HIGH (ref 11.2–14.5)
Retic %: 1.09 % (ref 0.70–2.10)
Retic Ct Abs: 43.6 10*3/uL (ref 33.70–90.70)
WBC: 10.7 10*3/uL — ABNORMAL HIGH (ref 3.9–10.3)
lymph#: 1.9 10*3/uL (ref 0.9–3.3)

## 2013-10-23 LAB — MORPHOLOGY: PLT EST: ADEQUATE

## 2013-10-23 LAB — FERRITIN CHCC: FERRITIN: 208 ng/mL (ref 9–269)

## 2013-10-23 LAB — LACTATE DEHYDROGENASE (CC13): LDH: 165 U/L (ref 125–245)

## 2013-10-23 LAB — CHCC SMEAR

## 2013-10-30 ENCOUNTER — Telehealth: Payer: Self-pay | Admitting: *Deleted

## 2013-10-30 NOTE — Telephone Encounter (Signed)
Per Dr. Beryle Beams; notified pt that iron storage is good but hgb is less that last month.  Explained that in Feb it was 9.4 and now last week 3/18 it has dropped to 8.7 and Dr. Beryle Beams recommends doing a bone marrow biopsy to see if there are any problems with your blood production.  Advised pt she can arrange for this before seeing Ned Card, NP on 4/13 or wait and talk with her about this further at this appt.  Pt states she would rather wait and talk with Lattie Haw on 4/13.  Pt verbalized understanding of information and states she will be here on 4/13.

## 2013-10-30 NOTE — Telephone Encounter (Signed)
Message copied by Domenic Schwab on Wed Oct 30, 2013 11:54 AM ------      Message from: Annia Belt      Created: Mon Oct 28, 2013 11:14 PM       Call pt:  Iron storage protein is good but hemoglobin less than last month. Now 8.7, was 9.4. We may need to do a bone marrow biopsy to see if there are any problems with her blood production.  She is scheduled to see Lattie Haw on 4/13. We could arrange for an outpatient bone marrow biopsy before then or we can just talk about it at time of visit.  Timing is bad since I will be moving to [place]. We could arrange for an appointment there if she would like. ------

## 2013-11-18 ENCOUNTER — Ambulatory Visit (HOSPITAL_BASED_OUTPATIENT_CLINIC_OR_DEPARTMENT_OTHER): Payer: Medicare Other | Admitting: Nurse Practitioner

## 2013-11-18 ENCOUNTER — Other Ambulatory Visit (HOSPITAL_BASED_OUTPATIENT_CLINIC_OR_DEPARTMENT_OTHER): Payer: Medicare Other

## 2013-11-18 ENCOUNTER — Telehealth: Payer: Self-pay | Admitting: Oncology

## 2013-11-18 VITALS — BP 159/70 | HR 91 | Temp 97.7°F | Resp 18 | Ht 63.0 in | Wt 132.3 lb

## 2013-11-18 DIAGNOSIS — D509 Iron deficiency anemia, unspecified: Secondary | ICD-10-CM

## 2013-11-18 DIAGNOSIS — D649 Anemia, unspecified: Secondary | ICD-10-CM

## 2013-11-18 LAB — CBC WITH DIFFERENTIAL/PLATELET
BASO%: 0.6 % (ref 0.0–2.0)
BASOS ABS: 0.1 10*3/uL (ref 0.0–0.1)
EOS ABS: 0.1 10*3/uL (ref 0.0–0.5)
EOS%: 0.9 % (ref 0.0–7.0)
HCT: 26.7 % — ABNORMAL LOW (ref 34.8–46.6)
HEMOGLOBIN: 8 g/dL — AB (ref 11.6–15.9)
LYMPH#: 1.4 10*3/uL (ref 0.9–3.3)
LYMPH%: 13.6 % — ABNORMAL LOW (ref 14.0–49.7)
MCH: 20.6 pg — AB (ref 25.1–34.0)
MCHC: 30.1 g/dL — ABNORMAL LOW (ref 31.5–36.0)
MCV: 68.6 fL — AB (ref 79.5–101.0)
MONO#: 0.8 10*3/uL (ref 0.1–0.9)
MONO%: 7.9 % (ref 0.0–14.0)
NEUT%: 77 % — ABNORMAL HIGH (ref 38.4–76.8)
NEUTROS ABS: 7.7 10*3/uL — AB (ref 1.5–6.5)
Platelets: 476 10*3/uL — ABNORMAL HIGH (ref 145–400)
RBC: 3.9 10*6/uL (ref 3.70–5.45)
RDW: 22.8 % — ABNORMAL HIGH (ref 11.2–14.5)
WBC: 10 10*3/uL (ref 3.9–10.3)

## 2013-11-18 LAB — FERRITIN CHCC: FERRITIN: 231 ng/mL (ref 9–269)

## 2013-11-18 NOTE — Telephone Encounter (Signed)
gv pt appt schedule for april lb/blood 4/20. per 4/13 pof pt to have bx via IR and see JG 2 wks after. order for bx cannot be seen on the outpt side - LT to change order. once order entered central will call pt w/appt. message will be sent to [facility] re 2wk f/u w/JG when bx on schedule.

## 2013-11-18 NOTE — Progress Notes (Signed)
  Angelica Murphy   Diagnosis:  Iron deficiency anemia.  INTERVAL HISTORY:   [name] is a 78 year old woman followed by Dr. Beryle Beams for iron malabsorption anemia. She receives periodic iron infusions. She last received iron on 05/20/2013 at which time the hemoglobin was 7.5 and MCV 64.7. The hemoglobin partially corrected to 10.1 on labs 07/22/2013; ferritin 287. Hemoglobin on 09/25/2013 was 9.4 and ferritin 154. Labs 10/23/2013 showed a hemoglobin of 8.7, MCV 70.9, ferritin 208.  She reports a stable poor energy level. She denies bleeding. She has a periodic "sore throat". She denies chest pain. She has had some shortness of breath which she attributes to a "sinus problem". Over the past year she has had intermittent episodes of "choking".  Objective:  Vital signs in last 24 hours:  Blood pressure 159/70, pulse 91, temperature 97.7 F (36.5 C), temperature source Oral, resp. rate 18, height $RemoveBe'5\' 3"'iqEFofmIo$  (1.6 m), weight 132 lb 4.8 oz (60.011 kg), SpO2 98.00%.    HEENT: No thrush or ulcerations. Lymphatics: No palpable cervical, supraclavicular or axillary lymph nodes. Resp: Lungs clear. Cardio: Regular cardiac rhythm. GI: Abdomen soft and nontender. No organomegaly. Vascular: No leg edema.     Lab Results:  Lab Results  Component Value Date   WBC 10.0 11/18/2013   HGB 8.0* 11/18/2013   HCT 26.7* 11/18/2013   MCV 68.6* 11/18/2013   PLT 476* 11/18/2013   NEUTROABS 7.7* 11/18/2013      Imaging:  No results found.  Medications: I have reviewed the patient's current medications.  Assessment/Plan: 1. 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 2. Type 2 diabetes. 3. Hypertension. 4. Gout. 5. GERD. 6. Diverticulosis. 7. History of benign biopsy left breast in 2000. 8. History of paroxysmal tachycardia February 2012. 9. Hyperlipidemia. 10. One-year history of  intermittent "choking" episodes. She will discuss with Dr. Silvio Pate.   Disposition: She has progressive anemia. Per Dr. Azucena Freed Murphy dated 10/30/2013 she may need a bone marrow biopsy. She is in agreement to proceed with a bone marrow biopsy. We made a referral to interventional radiology at [facility] to arrange.  She does not feel she needs a blood transfusion at present. She will return for a CBC and possible transfusion in one week. She understands to contact the office in the interim if she becomes symptomatic.  She would like to followup with Dr. Beryle Beams at [facility]. We will schedule a followup visit with Dr. Beryle Beams about 2 weeks after the bone marrow to review the results.   Plan reviewed with Dr. Benay Spice with his agreement.   Owens Shark ANP/GNP-BC   11/18/2013  1:48 PM

## 2013-11-20 NOTE — Addendum Note (Signed)
Addended by: Owens Shark on: 11/20/2013 04:34 PM   Modules accepted: Orders, SmartSet

## 2013-11-25 ENCOUNTER — Other Ambulatory Visit: Payer: Self-pay | Admitting: Nurse Practitioner

## 2013-11-25 ENCOUNTER — Other Ambulatory Visit (HOSPITAL_BASED_OUTPATIENT_CLINIC_OR_DEPARTMENT_OTHER): Payer: Medicare Other

## 2013-11-25 ENCOUNTER — Ambulatory Visit: Payer: Medicare Other

## 2013-11-25 ENCOUNTER — Ambulatory Visit (HOSPITAL_COMMUNITY)
Admission: RE | Admit: 2013-11-25 | Discharge: 2013-11-25 | Disposition: A | Discharge status: Home / Self Care | Payer: Medicare Other | Source: Ambulatory Visit | Attending: Oncology | Admitting: Oncology

## 2013-11-25 DIAGNOSIS — D649 Anemia, unspecified: Secondary | ICD-10-CM

## 2013-11-25 DIAGNOSIS — D509 Iron deficiency anemia, unspecified: Secondary | ICD-10-CM

## 2013-11-25 LAB — CBC WITH DIFFERENTIAL/PLATELET
BASO%: 0.6 % (ref 0.0–2.0)
Basophils Absolute: 0.1 10*3/uL (ref 0.0–0.1)
EOS%: 0.8 % (ref 0.0–7.0)
Eosinophils Absolute: 0.1 10*3/uL (ref 0.0–0.5)
HCT: 25.2 % — ABNORMAL LOW (ref 34.8–46.6)
HGB: 7.8 g/dL — ABNORMAL LOW (ref 11.6–15.9)
LYMPH%: 14.2 % (ref 14.0–49.7)
MCH: 20.7 pg — AB (ref 25.1–34.0)
MCHC: 31 g/dL — AB (ref 31.5–36.0)
MCV: 67 fL — ABNORMAL LOW (ref 79.5–101.0)
MONO#: 0.8 10*3/uL (ref 0.1–0.9)
MONO%: 7.9 % (ref 0.0–14.0)
NEUT#: 7.6 10*3/uL — ABNORMAL HIGH (ref 1.5–6.5)
NEUT%: 76.5 % (ref 38.4–76.8)
PLATELETS: 467 10*3/uL — AB (ref 145–400)
RBC: 3.76 10*6/uL (ref 3.70–5.45)
RDW: 23.1 % — ABNORMAL HIGH (ref 11.2–14.5)
WBC: 10 10*3/uL (ref 3.9–10.3)
lymph#: 1.4 10*3/uL (ref 0.9–3.3)

## 2013-11-25 LAB — HOLD TUBE, BLOOD BANK

## 2013-11-25 LAB — ABO/RH: ABO/RH(D): O POS

## 2013-11-25 LAB — PREPARE RBC (CROSSMATCH)

## 2013-11-25 MED ORDER — SODIUM CHLORIDE 0.9 % IV SOLN: 250.0000 mL | Freq: Once | INTRAVENOUS | Status: DC

## 2013-11-25 NOTE — Progress Notes (Signed)
Patient coming tomorrow for blood transfusion as her blood will not be ready today.

## 2013-11-26 ENCOUNTER — Ambulatory Visit (INDEPENDENT_AMBULATORY_CARE_PROVIDER_SITE_OTHER): Payer: Medicare Other | Admitting: Internal Medicine

## 2013-11-26 ENCOUNTER — Ambulatory Visit (HOSPITAL_BASED_OUTPATIENT_CLINIC_OR_DEPARTMENT_OTHER): Payer: Medicare Other

## 2013-11-26 ENCOUNTER — Encounter: Payer: Self-pay | Admitting: Internal Medicine

## 2013-11-26 VITALS — BP 158/70 | HR 94 | Temp 98.6°F | Wt 131.0 lb

## 2013-11-26 VITALS — BP 144/83 | HR 86 | Temp 97.2°F | Resp 18

## 2013-11-26 DIAGNOSIS — R070 Pain in throat: Secondary | ICD-10-CM

## 2013-11-26 DIAGNOSIS — D649 Anemia, unspecified: Secondary | ICD-10-CM

## 2013-11-26 DIAGNOSIS — D508 Other iron deficiency anemias: Secondary | ICD-10-CM

## 2013-11-26 MED ORDER — SODIUM CHLORIDE 0.9 % IV SOLN
250.0000 mL | Freq: Once | INTRAVENOUS | Status: AC
  Administered 2013-11-26: 250 mL via INTRAVENOUS

## 2013-11-26 NOTE — Patient Instructions (Signed)
Blood Transfusion Information WHAT IS A BLOOD TRANSFUSION? A transfusion is the replacement of blood or some of its parts. Blood is made up of multiple cells which provide different functions.  Red blood cells carry oxygen and are used for blood loss replacement.  White blood cells fight against infection.  Platelets control bleeding.  Plasma helps clot blood.  Other blood products are available for specialized needs, such as hemophilia or other clotting disorders. BEFORE THE TRANSFUSION  Who gives blood for transfusions?   You may be able to donate blood to be used at a later date on yourself (autologous donation).  Relatives can be asked to donate blood. This is generally not any safer than if you have received blood from a stranger. The same precautions are taken to ensure safety when a relative's blood is donated.  Healthy volunteers who are fully evaluated to make sure their blood is safe. This is blood bank blood. Transfusion therapy is the safest it has ever been in the practice of medicine. Before blood is taken from a donor, a complete history is taken to make sure that person has no history of diseases nor engages in risky social behavior (examples are intravenous drug use or sexual activity with multiple partners). The donor's travel history is screened to minimize risk of transmitting infections, such as malaria. The donated blood is tested for signs of infectious diseases, such as HIV and hepatitis. The blood is then tested to be sure it is compatible with you in order to minimize the chance of a transfusion reaction. If you or a relative donates blood, this is often done in anticipation of surgery and is not appropriate for emergency situations. It takes many days to process the donated blood. RISKS AND COMPLICATIONS Although transfusion therapy is very safe and saves many lives, the main dangers of transfusion include:   Getting an infectious disease.  Developing a  transfusion reaction. This is an allergic reaction to something in the blood you were given. Every precaution is taken to prevent this. The decision to have a blood transfusion has been considered carefully by your caregiver before blood is given. Blood is not given unless the benefits outweigh the risks. AFTER THE TRANSFUSION  Right after receiving a blood transfusion, you will usually feel much better and more energetic. This is especially true if your red blood cells have gotten low (anemic). The transfusion raises the level of the red blood cells which carry oxygen, and this usually causes an energy increase.  The nurse administering the transfusion will monitor you carefully for complications. HOME CARE INSTRUCTIONS  No special instructions are needed after a transfusion. You may find your energy is better. Speak with your caregiver about any limitations on activity for underlying diseases you may have. SEEK MEDICAL CARE IF:   Your condition is not improving after your transfusion.  You develop redness or irritation at the intravenous (IV) site. SEEK IMMEDIATE MEDICAL CARE IF:  Any of the following symptoms occur over the next 12 hours:  Shaking chills.  You have a temperature by mouth above 102 F (38.9 C), not controlled by medicine.  Chest, back, or muscle pain.  People around you feel you are not acting correctly or are confused.  Shortness of breath or difficulty breathing.  Dizziness and fainting.  You get a rash or develop hives.  You have a decrease in urine output.  Your urine turns a dark color or changes to pink, red, or brown. Any of the following   symptoms occur over the next 10 days:  You have a temperature by mouth above 102 F (38.9 C), not controlled by medicine.  Shortness of breath.  Weakness after normal activity.  The white part of the eye turns yellow (jaundice).  You have a decrease in the amount of urine or are urinating less often.  Your  urine turns a dark color or changes to pink, red, or brown. Document Released: 07/22/2000 Document Revised: 10/17/2011 Document Reviewed: 03/10/2008 ExitCare Patient Information 2014 [employer].  

## 2013-11-26 NOTE — Patient Instructions (Signed)
Please increase your loratadine to 2 of the 10mg  tablets daily. If no better in 2-3 days, add cetirzine 10mg  daily (also available over the counter). If you are still having the throat problems in another couple of weeks, let me know and I will change the ramipril medication (in case that is causing the throat symptoms).

## 2013-11-26 NOTE — Progress Notes (Signed)
Subjective:    Patient ID: Angelica Murphy, female    DOB: [birth year], 78 y.o.   MRN: [id number]  HPI Went in yesterday for repeat blood work Was going to get transfused but couldn't get crossmatch Going back today to try again Due for bone marrow next week  Having some trouble with her throat Has some pain intermittently Feels sore at times and has cough Goes back a month or so  Has lack of energy but not sick No fever Some cough Some SOB-- with exertion  Has been suffering with sinus and allergy No rhinorrhea or sneezing Usually gets headaches with this---better Taking the loratadine  Current Outpatient Prescriptions on File Prior to Visit  Medication Sig Dispense Refill  . acetaminophen (ARTHRITIS PAIN RELIEF) 650 MG CR tablet Take 650 mg by mouth every 8 (eight) hours as needed.        Marland Kitchen aspirin 81 MG EC tablet Take 81 mg by mouth daily.        . bisoprolol (ZEBETA) 5 MG tablet Take 1 tablet (5 mg total) by mouth daily.  90 tablet  3  . Insulin Glargine (LANTUS SOLOSTAR) 100 UNIT/ML Solostar Pen Inject 15 Units into the skin at bedtime. Dx: 250.00  15 pen  3  . Insulin Pen Needle (B-D ULTRAFINE III SHORT PEN) 31G X 8 MM MISC Use as directed  100 each  6  . Lancets (ONETOUCH ULTRASOFT) lancets USE AS DIRECTED  100 each  1  . loratadine (CLARITIN) 10 MG tablet Take 10-20 mg by mouth daily as needed.      . metFORMIN (GLUCOPHAGE) 1000 MG tablet Take 1 tablet (1,000 mg total) by mouth 2 (two) times daily with a meal.  180 tablet  3  . Multiple Vitamin (MULTIVITAMIN) tablet Take 1 tablet by mouth daily.        . ONE TOUCH ULTRA TEST test strip USE ONCE A DAY DX. 250.00  100 each  7  . pantoprazole (PROTONIX) 40 MG tablet Take 1 tablet by mouth  daily  90 tablet  3  . ramipril (ALTACE) 10 MG capsule Take 1 capsule (10 mg total) by mouth daily.  90 capsule  3  . simvastatin (ZOCOR) 20 MG tablet Take 1 tablet (20 mg total) by mouth daily at 6 PM.  90 tablet  3  . traMADol  (ULTRAM) 50 MG tablet TAKE 1/2 TO 1 TABLET BY MOUTH 3 TIMES DAILY AS NEEDED  30 tablet  0   No current facility-administered medications on file prior to visit.    Allergies  Allergen Reactions  . Sulfonamide Derivatives     REACTION: eyes red and blurry    Past Medical History  Diagnosis Date  . Allergic rhinitis   . Diabetes mellitus     Type II  . Diverticulosis of colon   . GERD (gastroesophageal reflux disease)   . Hyperlipidemia   . Hypertension   . Hx of colonic polyps   . Gout   . Hemorrhoids   . Family history of colon cancer father  . Anemia   . Tachycardia, paroxysmal 09-2010    Past Surgical History  Procedure Laterality Date  . Abdominal hysterectomy  1972  . Cesarean section    . Breast lumpectomy  02/07    Left  (Young)  Benign  . Breast biopsy  02/08    left- Benign   . Cataract extraction, bilateral  February and March, 2014    Family History  Problem  Relation Age of Onset  . Kidney disease Mother   . Colon cancer Father   . Heart disease Sister     CVA  . Lung cancer Brother   . Breast cancer      neice    History   Social History  . Marital Status: Married    Spouse Name: N/A    Number of Children: 3  . Years of Education: N/A   Occupational History  . 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 Volunteers at [employer]  . Smoking status: Never Smoker   . Smokeless tobacco: Never Used  . Alcohol Use: No  . Drug Use: No  . Sexual Activity: Not on file   Other Topics Concern  . Not on file   Social History Narrative   Has old living will   No formal health care POA-- but requests son, Haywood Lasso   Would accept resuscitation but no prolonged ventilation   No feeding tube if cognitively unaware   Review of Systems Appetite is not great No heartburn No trouble swallowing but will get choked up at times    Objective:   Physical Exam  Constitutional: She appears well-developed and well-nourished.  No distress.  HENT:  No sinus tenderness Moderate pale nasal swelling Slight pharyngeal injection-- no exudates  Neck: Normal range of motion. Neck supple. No thyromegaly present.  Pulmonary/Chest: Effort normal and breath sounds normal. No respiratory distress. She has no wheezes. She has no rales.  Abdominal: Soft. There is no tenderness.  Lymphadenopathy:    She has no cervical adenopathy.          Assessment & Plan:

## 2013-11-26 NOTE — Assessment & Plan Note (Signed)
I am most concerned that this is allergy related---given the timing. Otherwise, I am concerned about a reaction to the ramipril----though she has been on this for years GERD is another possibility but she doesn't have other symptoms to suggest this  Will increase her allergy regimen If no better, will change to ARB Consider ENT eval

## 2013-11-26 NOTE — Progress Notes (Signed)
Pre visit review using our clinic review tool, if applicable. No additional management support is needed unless otherwise documented below in the visit note. 

## 2013-11-27 ENCOUNTER — Encounter (HOSPITAL_COMMUNITY): Payer: Self-pay | Admitting: Pharmacy Technician

## 2013-11-27 LAB — TYPE AND SCREEN
ABO/RH(D): O POS
ANTIBODY SCREEN: POSITIVE
DAT, IgG: NEGATIVE
PT AG Type: NEGATIVE
UNIT DIVISION: 0
Unit division: 0

## 2013-11-28 ENCOUNTER — Other Ambulatory Visit: Payer: Self-pay | Admitting: Radiology

## 2013-11-29 ENCOUNTER — Ambulatory Visit
Admission: RE | Admit: 2013-11-29 | Discharge: 2013-11-29 | Disposition: A | Discharge status: Home / Self Care | Payer: Medicare Other | Source: Ambulatory Visit

## 2013-11-29 DIAGNOSIS — Z1231 Encounter for screening mammogram for malignant neoplasm of breast: Secondary | ICD-10-CM

## 2013-12-02 ENCOUNTER — Ambulatory Visit (HOSPITAL_COMMUNITY)
Admission: RE | Admit: 2013-12-02 | Discharge: 2013-12-02 | Disposition: A | Discharge status: Home / Self Care | Payer: Medicare Other | Source: Ambulatory Visit | Attending: Nurse Practitioner | Admitting: Nurse Practitioner

## 2013-12-02 ENCOUNTER — Encounter (HOSPITAL_COMMUNITY): Payer: Self-pay

## 2013-12-02 DIAGNOSIS — D649 Anemia, unspecified: Secondary | ICD-10-CM | POA: Insufficient documentation

## 2013-12-02 LAB — CBC
HEMATOCRIT: 32.6 % — AB (ref 36.0–46.0)
Hemoglobin: 9.9 g/dL — ABNORMAL LOW (ref 12.0–15.0)
MCH: 22 pg — ABNORMAL LOW (ref 26.0–34.0)
MCHC: 30.4 g/dL (ref 30.0–36.0)
MCV: 72.4 fL — AB (ref 78.0–100.0)
PLATELETS: 470 10*3/uL — AB (ref 150–400)
RBC: 4.5 MIL/uL (ref 3.87–5.11)
RDW: 22.3 % — AB (ref 11.5–15.5)
WBC: 10.2 10*3/uL (ref 4.0–10.5)

## 2013-12-02 LAB — PROTIME-INR
INR: 1.21 (ref 0.00–1.49)
Prothrombin Time: 15 seconds (ref 11.6–15.2)

## 2013-12-02 LAB — APTT: aPTT: 37 seconds (ref 24–37)

## 2013-12-02 LAB — GLUCOSE, CAPILLARY: Glucose-Capillary: 153 mg/dL — ABNORMAL HIGH (ref 70–99)

## 2013-12-02 LAB — BONE MARROW EXAM

## 2013-12-02 MED ORDER — SODIUM CHLORIDE 0.9 % IV SOLN
Freq: Once | INTRAVENOUS | Status: AC
  Administered 2013-12-02: 08:00:00 via INTRAVENOUS

## 2013-12-02 MED ORDER — MIDAZOLAM HCL 2 MG/2ML IJ SOLN
INTRAMUSCULAR | Status: AC | PRN
  Administered 2013-12-02 (×2): 1 mg via INTRAVENOUS

## 2013-12-02 MED ORDER — FENTANYL CITRATE 0.05 MG/ML IJ SOLN
INTRAMUSCULAR | Status: AC | PRN
Start: 2013-12-02 — End: 2013-12-02
  Administered 2013-12-02 (×2): 50 ug via INTRAVENOUS

## 2013-12-02 MED ORDER — HYDROCODONE-ACETAMINOPHEN 5-325 MG PO TABS
1.0000 | ORAL_TABLET | ORAL | Status: DC | PRN
  Filled 2013-12-02: qty 2

## 2013-12-02 MED ORDER — FENTANYL CITRATE 0.05 MG/ML IJ SOLN
INTRAMUSCULAR | Status: AC
  Filled 2013-12-02: qty 4

## 2013-12-02 MED ORDER — MIDAZOLAM HCL 2 MG/2ML IJ SOLN
INTRAMUSCULAR | Status: AC
  Filled 2013-12-02: qty 4

## 2013-12-02 NOTE — Procedures (Signed)
BMBx

## 2013-12-02 NOTE — Discharge Instructions (Signed)
Bone Marrow Aspiration, Bone Marrow Biopsy °Care After °Read the instructions outlined below and refer to this sheet in the next few weeks. These discharge instructions provide you with general information on caring for yourself after you leave the hospital. Your caregiver may also give you specific instructions. While your treatment has been planned according to the most current medical practices available, unavoidable complications occasionally occur. If you have any problems or questions after discharge, call your caregiver. °FINDING OUT THE RESULTS OF YOUR TEST °Not all test results are available during your visit. If your test results are not back during the visit, make an appointment with your caregiver to find out the results. Do not assume everything is normal if you have not heard from your caregiver or the medical facility. It is important for you to follow up on all of your test results.  °HOME CARE INSTRUCTIONS  °You have had sedation and may be sleepy or dizzy. Your thinking may not be as clear as usual. For the next 24 hours: °· Only take over-the-counter or prescription medicines for pain, discomfort, and or fever as directed by your caregiver. °· Do not drink alcohol. °· Do not smoke. °· Do not drive. °· Do not make important legal decisions. °· Do not operate heavy machinery. °· Do not care for small children by yourself. °· Keep your dressing clean and dry. You may replace dressing with a bandage after 24 hours. °· You may take a bath or shower after 24 hours. °· Use an ice pack for 20 minutes every 2 hours while awake for pain as needed. °SEEK MEDICAL CARE IF:  °· There is redness, swelling, or increasing pain at the biopsy site. °· There is pus coming from the biopsy site. °· There is drainage from a biopsy site lasting longer than one day. °· An unexplained oral temperature above 102° F (38.9° C) develops. °SEEK IMMEDIATE MEDICAL CARE IF:  °· You develop a rash. °· You have difficulty  breathing. °· You develop any reaction or side effects to medications given. °Document Released: 02/11/2005 Document Revised: 10/17/2011 Document Reviewed: 07/22/2008 °ExitCare® Patient Information ©2014 [employer]. °Moderate Sedation, Adult °Moderate sedation is given to help you relax or even sleep through a procedure. You may remain sleepy, be clumsy, or have poor balance for several hours following this procedure. Arrange for a responsible adult, family member, or friend to take you home. A responsible adult should stay with you for at least 24 hours or until the medicines have worn off. °· Do not participate in any activities where you could become injured for the next 24 hours, or until you feel normal again. Do not: °· Drive. °· Swim. °· Ride a bicycle. °· Operate heavy machinery. °· Cook. °· Use power tools. °· Climb ladders. °· Work at heights. °· Do not make important decisions or sign legal documents until you are improved. °· Vomiting may occur if you eat too soon. When you can drink without vomiting, try water, juice, or soup. Try solid foods if you feel little or no nausea. °· Only take over-the-counter or prescription medications for pain, discomfort, or fever as directed by your caregiver.If pain medications have been prescribed for you, ask your caregiver how soon it is safe to take them. °· Make sure you and your family fully understands everything about the medication given to you. Make sure you understand what side effects may occur. °· You should not drink alcohol, take sleeping pills, or medications that cause drowsiness   for at least 24 hours. °· If you smoke, do not smoke alone. °· If you are feeling better, you may resume normal activities 24 hours after receiving sedation. °· Keep all appointments as scheduled. Follow all instructions. °· Ask questions if you do not understand. °SEEK MEDICAL CARE IF:  °· Your skin is pale or bluish in color. °· You continue to feel sick to your stomach  (nauseous) or throw up (vomit). °· Your pain is getting worse and not helped by medication. °· You have bleeding or swelling. °· You are still sleepy or feeling clumsy after 24 hours. °SEEK IMMEDIATE MEDICAL CARE IF:  °· You develop a rash. °· You have difficulty breathing. °· You develop any type of allergic problem. °· You have a fever. °Document Released: 04/19/2001 Document Revised: 10/17/2011 Document Reviewed: 04/01/2013 °ExitCare® Patient Information ©2014 [employer]. ° °

## 2013-12-02 NOTE — H&P (Signed)
Chief Complaint: "I'm here for a bone marrow biopsy" Referring Physician:Letvak HPI: Angelica Murphy is an 78 y.o. female with chronic anemia. She is undergoing ongoing workup and is referred for a bone marrow biopsy. PMHx and meds reviewed. Pt feels ok this morning.  Past Medical History:  Past Medical History  Diagnosis Date  . Allergic rhinitis   . Diabetes mellitus     Type II  . Diverticulosis of colon   . GERD (gastroesophageal reflux disease)   . Hyperlipidemia   . Hypertension   . Hx of colonic polyps   . Gout   . Hemorrhoids   . Family history of colon cancer father  . Anemia   . Tachycardia, paroxysmal 09-2010    Past Surgical History:  Past Surgical History  Procedure Laterality Date  . Abdominal hysterectomy  1972  . Cesarean section    . Breast lumpectomy  02/07    Left  (Young)  Benign  . Breast biopsy  02/08    left- Benign   . Cataract extraction, bilateral  February and March, 2014    Family History:  Family History  Problem Relation Age of Onset  . Kidney disease Mother   . Colon cancer Father   . Heart disease Sister     CVA  . Lung cancer Brother   . Breast cancer      neice    Social History:  reports that she has never smoked. She has never used smokeless tobacco. She reports that she does not drink alcohol or use illicit drugs.  Allergies:  Allergies  Allergen Reactions  . Sulfonamide Derivatives     REACTION: eyes red and blurry    Medications:   Medication List    ASK your doctor about these medications       ARTHRITIS PAIN RELIEF 650 MG CR tablet  Generic drug:  acetaminophen  Take 650 mg by mouth every 8 (eight) hours as needed for pain.     insulin glargine 100 UNIT/ML injection  Commonly known as:  LANTUS  Inject 15 Units into the skin at bedtime.     loratadine 10 MG tablet  Commonly known as:  CLARITIN  Take 10-20 mg by mouth daily.     metFORMIN 1000 MG tablet  Commonly known as:  GLUCOPHAGE  Take 1,000 mg  by mouth 2 (two) times daily with a meal.     nebivolol 5 MG tablet  Commonly known as:  BYSTOLIC  Take 5 mg by mouth every morning.     pantoprazole 40 MG tablet  Commonly known as:  PROTONIX  Take 40 mg by mouth daily.     ramipril 10 MG capsule  Commonly known as:  ALTACE  Take 10 mg by mouth every morning.     simvastatin 20 MG tablet  Commonly known as:  ZOCOR  Take 20 mg by mouth every evening.        Please HPI for pertinent positives, otherwise complete 10 system ROS negative.  Physical Exam: BP 154/70  Pulse 95  Temp(Src) 97.8 F (36.6 C) (Oral)  Resp 18  SpO2 97% There is no weight on file to calculate BMI.   General Appearance:  Alert, cooperative, no distress, appears stated age  Head:  Normocephalic, without obvious abnormality, atraumatic  ENT: Unremarkable  Neck: Supple, symmetrical, trachea midline  Lungs:   Clear to auscultation bilaterally, no w/r/r, respirations unlabored without use of accessory muscles.  Chest Wall:  No tenderness or deformity  Heart:  Regular rate and rhythm, S1, S2 normal, no murmur, rub or gallop.  Neurologic: Normal affect, no gross deficits.   Results for orders placed during the hospital encounter of 12/02/13 (from the past 48 hour(s))  APTT     Status: None   Collection Time    12/02/13  7:06 AM      Result Value Ref Range   aPTT 37  24 - 37 seconds   Comment:            IF BASELINE aPTT IS ELEVATED,     SUGGEST PATIENT RISK ASSESSMENT     BE USED TO DETERMINE APPROPRIATE     ANTICOAGULANT THERAPY.  CBC     Status: Abnormal   Collection Time    12/02/13  7:06 AM      Result Value Ref Range   WBC 10.2  4.0 - 10.5 K/uL   RBC 4.50  3.87 - 5.11 MIL/uL   Hemoglobin 9.9 (*) 12.0 - 15.0 g/dL   HCT 32.6 (*) 36.0 - 46.0 %   MCV 72.4 (*) 78.0 - 100.0 fL   MCH 22.0 (*) 26.0 - 34.0 pg   MCHC 30.4  30.0 - 36.0 g/dL   RDW 22.3 (*) 11.5 - 15.5 %   Platelets 470 (*) 150 - 400 K/uL  PROTIME-INR     Status: None   Collection  Time    12/02/13  7:06 AM      Result Value Ref Range   Prothrombin Time 15.0  11.6 - 15.2 seconds   INR 1.21  0.00 - 1.49  GLUCOSE, CAPILLARY     Status: Abnormal   Collection Time    12/02/13  7:42 AM      Result Value Ref Range   Glucose-Capillary 153 (*) 70 - 99 mg/dL   No results found.  Assessment/Plan Chronic anemia For CT guided bone marrow biopsy Explained procedure, risks, complications, use of sedation. Labs reviewed, ok Consent signed in chart  Ascencion Dike PA-C 12/02/2013, 8:23 AM

## 2013-12-04 ENCOUNTER — Other Ambulatory Visit: Payer: Self-pay | Admitting: Oncology

## 2013-12-04 ENCOUNTER — Encounter: Payer: Self-pay | Admitting: Oncology

## 2013-12-04 ENCOUNTER — Telehealth: Payer: Self-pay | Admitting: *Deleted

## 2013-12-04 DIAGNOSIS — M898X5 Other specified disorders of bone, thigh: Secondary | ICD-10-CM

## 2013-12-04 NOTE — Telephone Encounter (Signed)
Message copied by Ebbie Latus on Wed Dec 04, 2013 10:29 AM ------      Message from: Annia Belt      Created: Wed Dec 04, 2013  9:39 AM       Holley Raring - I put in orders for a bone survey to be done at [facility] on this lady - she is waiting to hear when it will be scheduled - can you schedule then call her please?  Thanks      DrG ------

## 2013-12-04 NOTE — Telephone Encounter (Signed)
Appt had already been scheduled. Called pt - pt had been informed of date/time.

## 2013-12-04 NOTE — Progress Notes (Signed)
Patient ID: Angelica Murphy, female   DOB: 09-May-1936, 78 y.o.   MRN: [id number] 78 year-old woman under evaluation for a multifactorial anemia. She has an element of iron malabsorption. However, following parenteral iron replacement she remained anemic and required a blood transfusion. I suspected an underlying bone marrow disorder. I referred her for a bone marrow biopsy done on April 27. I have discussed the results with the pathologist yesterday. There are no major findings to suggest a myelodysplastic syndrome. Maturation is orderly with the exception of some subtle changes in the megakaryocyte line of questionable significance. No evidence for excess blasts, plasma cells, or ring sideroblasts. Iron stores are adequate. Reviewing her other lab data, there is no evidence for a hemolytic process with a normal reticulocyte count, LDH, and bilirubin. I think that there is still the possibility that she has an early myelodysplastic syndrome. Chromosome studies are pending.  Incidentally noted by the radiologist at the time of the CT guided bone marrow biopsy done at the right iliac crest, was a lytic lesion in the left iliac bone. The patient is asymptomatic. Alkaline phosphatase and calcium levels were normal on March 18. No signs of myeloma in the bone marrow. She just had mammograms last week on April 24 and they were normal.  I called to discuss these results with the patient and her husband. I will go ahead and get regular x-rays of her bones. She will be scheduled to see me for a followup visit at the [facility] internal medicine clinic within the next few weeks when results of outstanding studies are available.

## 2013-12-09 ENCOUNTER — Emergency Department (HOSPITAL_COMMUNITY): Payer: Medicare Other

## 2013-12-09 ENCOUNTER — Other Ambulatory Visit: Payer: Self-pay

## 2013-12-09 ENCOUNTER — Ambulatory Visit (HOSPITAL_COMMUNITY)
Admission: RE | Admit: 2013-12-09 | Discharge: 2013-12-09 | Disposition: A | Discharge status: Home / Self Care | Payer: Medicare Other | Source: Ambulatory Visit | Attending: Oncology | Admitting: Oncology

## 2013-12-09 ENCOUNTER — Encounter (HOSPITAL_COMMUNITY): Payer: Self-pay | Admitting: Emergency Medicine

## 2013-12-09 ENCOUNTER — Observation Stay (HOSPITAL_COMMUNITY)
Admission: EM | Admit: 2013-12-09 | Discharge: 2013-12-10 | Disposition: A | Discharge status: Home / Self Care | Payer: Medicare Other | Attending: Internal Medicine | Admitting: Internal Medicine

## 2013-12-09 DIAGNOSIS — M898X5 Other specified disorders of bone, thigh: Secondary | ICD-10-CM

## 2013-12-09 DIAGNOSIS — I10 Essential (primary) hypertension: Secondary | ICD-10-CM | POA: Insufficient documentation

## 2013-12-09 DIAGNOSIS — J9600 Acute respiratory failure, unspecified whether with hypoxia or hypercapnia: Principal | ICD-10-CM | POA: Insufficient documentation

## 2013-12-09 DIAGNOSIS — D649 Anemia, unspecified: Secondary | ICD-10-CM | POA: Insufficient documentation

## 2013-12-09 DIAGNOSIS — N2889 Other specified disorders of kidney and ureter: Secondary | ICD-10-CM

## 2013-12-09 DIAGNOSIS — C7800 Secondary malignant neoplasm of unspecified lung: Secondary | ICD-10-CM | POA: Insufficient documentation

## 2013-12-09 DIAGNOSIS — Z90710 Acquired absence of both cervix and uterus: Secondary | ICD-10-CM | POA: Insufficient documentation

## 2013-12-09 DIAGNOSIS — E785 Hyperlipidemia, unspecified: Secondary | ICD-10-CM | POA: Insufficient documentation

## 2013-12-09 DIAGNOSIS — M899 Disorder of bone, unspecified: Secondary | ICD-10-CM

## 2013-12-09 DIAGNOSIS — Z803 Family history of malignant neoplasm of breast: Secondary | ICD-10-CM | POA: Insufficient documentation

## 2013-12-09 DIAGNOSIS — Z800 Family history of malignant neoplasm of digestive organs: Secondary | ICD-10-CM | POA: Insufficient documentation

## 2013-12-09 DIAGNOSIS — I479 Paroxysmal tachycardia, unspecified: Secondary | ICD-10-CM | POA: Diagnosis present

## 2013-12-09 DIAGNOSIS — D509 Iron deficiency anemia, unspecified: Secondary | ICD-10-CM

## 2013-12-09 DIAGNOSIS — R042 Hemoptysis: Secondary | ICD-10-CM | POA: Insufficient documentation

## 2013-12-09 DIAGNOSIS — C649 Malignant neoplasm of unspecified kidney, except renal pelvis: Secondary | ICD-10-CM | POA: Insufficient documentation

## 2013-12-09 DIAGNOSIS — M949 Disorder of cartilage, unspecified: Secondary | ICD-10-CM

## 2013-12-09 DIAGNOSIS — N289 Disorder of kidney and ureter, unspecified: Secondary | ICD-10-CM

## 2013-12-09 DIAGNOSIS — Z79899 Other long term (current) drug therapy: Secondary | ICD-10-CM | POA: Insufficient documentation

## 2013-12-09 DIAGNOSIS — C801 Malignant (primary) neoplasm, unspecified: Secondary | ICD-10-CM

## 2013-12-09 DIAGNOSIS — K219 Gastro-esophageal reflux disease without esophagitis: Secondary | ICD-10-CM | POA: Diagnosis present

## 2013-12-09 DIAGNOSIS — C7951 Secondary malignant neoplasm of bone: Secondary | ICD-10-CM | POA: Insufficient documentation

## 2013-12-09 DIAGNOSIS — R222 Localized swelling, mass and lump, trunk: Secondary | ICD-10-CM

## 2013-12-09 DIAGNOSIS — Z794 Long term (current) use of insulin: Secondary | ICD-10-CM | POA: Insufficient documentation

## 2013-12-09 DIAGNOSIS — C7952 Secondary malignant neoplasm of bone marrow: Secondary | ICD-10-CM

## 2013-12-09 DIAGNOSIS — C799 Secondary malignant neoplasm of unspecified site: Secondary | ICD-10-CM

## 2013-12-09 DIAGNOSIS — M109 Gout, unspecified: Secondary | ICD-10-CM | POA: Insufficient documentation

## 2013-12-09 DIAGNOSIS — Z801 Family history of malignant neoplasm of trachea, bronchus and lung: Secondary | ICD-10-CM | POA: Insufficient documentation

## 2013-12-09 DIAGNOSIS — R599 Enlarged lymph nodes, unspecified: Secondary | ICD-10-CM | POA: Insufficient documentation

## 2013-12-09 DIAGNOSIS — E119 Type 2 diabetes mellitus without complications: Secondary | ICD-10-CM | POA: Insufficient documentation

## 2013-12-09 LAB — COMPREHENSIVE METABOLIC PANEL
ALT: 10 U/L (ref 0–35)
AST: 10 U/L (ref 0–37)
Albumin: 2.6 g/dL — ABNORMAL LOW (ref 3.5–5.2)
Alkaline Phosphatase: 158 U/L — ABNORMAL HIGH (ref 39–117)
BUN: 7 mg/dL (ref 6–23)
CO2: 27 meq/L (ref 19–32)
CREATININE: 0.64 mg/dL (ref 0.50–1.10)
Calcium: 9.6 mg/dL (ref 8.4–10.5)
Chloride: 97 mEq/L (ref 96–112)
GFR, EST NON AFRICAN AMERICAN: 85 mL/min — AB (ref >90)
GLUCOSE: 96 mg/dL (ref 70–99)
Potassium: 3.8 mEq/L (ref 3.7–5.3)
SODIUM: 136 meq/L — AB (ref 137–147)
TOTAL PROTEIN: 7.5 g/dL (ref 6.0–8.3)
Total Bilirubin: 0.6 mg/dL (ref 0.3–1.2)

## 2013-12-09 LAB — CBC
HEMATOCRIT: 31.9 % — AB (ref 36.0–46.0)
Hemoglobin: 9.7 g/dL — ABNORMAL LOW (ref 12.0–15.0)
MCH: 21.7 pg — AB (ref 26.0–34.0)
MCHC: 30.4 g/dL (ref 30.0–36.0)
MCV: 71.2 fL — ABNORMAL LOW (ref 78.0–100.0)
Platelets: 381 10*3/uL (ref 150–400)
RBC: 4.48 MIL/uL (ref 3.87–5.11)
RDW: 22.5 % — ABNORMAL HIGH (ref 11.5–15.5)
WBC: 10 10*3/uL (ref 4.0–10.5)

## 2013-12-09 LAB — TISSUE HYBRIDIZATION (BONE MARROW)-NCBH

## 2013-12-09 LAB — OCCULT BLOOD, POC DEVICE: Fecal Occult Bld: NEGATIVE

## 2013-12-09 LAB — GLUCOSE, CAPILLARY
GLUCOSE-CAPILLARY: 126 mg/dL — AB (ref 70–99)
Glucose-Capillary: 184 mg/dL — ABNORMAL HIGH (ref 70–99)

## 2013-12-09 LAB — CHROMOSOME ANALYSIS, BONE MARROW

## 2013-12-09 MED ORDER — INSULIN ASPART 100 UNIT/ML ~~LOC~~ SOLN: 0.0000 [IU] | Freq: Three times a day (TID) | SUBCUTANEOUS | Status: DC

## 2013-12-09 MED ORDER — NEBIVOLOL HCL 10 MG PO TABS
10.0000 mg | ORAL_TABLET | Freq: Every day | ORAL | Status: DC
  Administered 2013-12-10: 10 mg via ORAL
  Filled 2013-12-09: qty 1

## 2013-12-09 MED ORDER — ONDANSETRON HCL 4 MG/2ML IJ SOLN: 4.0000 mg | Freq: Four times a day (QID) | INTRAMUSCULAR | Status: DC | PRN

## 2013-12-09 MED ORDER — BENZONATATE 100 MG PO CAPS
100.0000 mg | ORAL_CAPSULE | Freq: Three times a day (TID) | ORAL | Status: DC
  Administered 2013-12-09 – 2013-12-10 (×2): 100 mg via ORAL
  Filled 2013-12-09 (×4): qty 1

## 2013-12-09 MED ORDER — HYDROCODONE-ACETAMINOPHEN 5-325 MG PO TABS: 1.0000 | ORAL_TABLET | Freq: Four times a day (QID) | ORAL | Status: DC | PRN

## 2013-12-09 MED ORDER — LEVALBUTEROL HCL 0.63 MG/3ML IN NEBU
0.6300 mg | INHALATION_SOLUTION | Freq: Once | RESPIRATORY_TRACT | Status: DC
  Filled 2013-12-09: qty 3

## 2013-12-09 MED ORDER — INSULIN ASPART 100 UNIT/ML ~~LOC~~ SOLN: 0.0000 [IU] | Freq: Every day | SUBCUTANEOUS | Status: DC

## 2013-12-09 MED ORDER — PIPERACILLIN-TAZOBACTAM 3.375 G IVPB
3.3750 g | Freq: Three times a day (TID) | INTRAVENOUS | Status: DC
  Administered 2013-12-10 (×2): 3.375 g via INTRAVENOUS
  Filled 2013-12-09 (×3): qty 50

## 2013-12-09 MED ORDER — ONDANSETRON HCL 4 MG PO TABS: 4.0000 mg | ORAL_TABLET | Freq: Four times a day (QID) | ORAL | Status: DC | PRN

## 2013-12-09 MED ORDER — SODIUM CHLORIDE 0.9 % IV SOLN
INTRAVENOUS | Status: DC
  Administered 2013-12-09: 20:00:00 via INTRAVENOUS

## 2013-12-09 MED ORDER — HYDROMORPHONE HCL PF 1 MG/ML IJ SOLN: 1.0000 mg | INTRAMUSCULAR | Status: DC | PRN

## 2013-12-09 MED ORDER — INSULIN GLARGINE 100 UNIT/ML ~~LOC~~ SOLN
10.0000 [IU] | Freq: Every day | SUBCUTANEOUS | Status: DC
  Administered 2013-12-09: 10 [IU] via SUBCUTANEOUS
  Filled 2013-12-09 (×2): qty 0.1

## 2013-12-09 MED ORDER — LEVALBUTEROL HCL 0.63 MG/3ML IN NEBU
0.6300 mg | INHALATION_SOLUTION | Freq: Four times a day (QID) | RESPIRATORY_TRACT | Status: DC
  Administered 2013-12-09: 0.63 mg via RESPIRATORY_TRACT

## 2013-12-09 MED ORDER — IOHEXOL 300 MG/ML  SOLN
100.0000 mL | Freq: Once | INTRAMUSCULAR | Status: AC | PRN
  Administered 2013-12-09: 100 mL via INTRAVENOUS

## 2013-12-09 MED ORDER — PIPERACILLIN-TAZOBACTAM 3.375 G IVPB 30 MIN
3.3750 g | Freq: Once | INTRAVENOUS | Status: AC
  Administered 2013-12-09: 3.375 g via INTRAVENOUS
  Filled 2013-12-09: qty 50

## 2013-12-09 MED ORDER — ACETAMINOPHEN 325 MG PO TABS: 650.0000 mg | ORAL_TABLET | Freq: Four times a day (QID) | ORAL | Status: DC | PRN

## 2013-12-09 MED ORDER — IOHEXOL 300 MG/ML  SOLN
50.0000 mL | Freq: Once | INTRAMUSCULAR | Status: AC | PRN
  Administered 2013-12-09: 50 mL via ORAL

## 2013-12-09 MED ORDER — PANTOPRAZOLE SODIUM 40 MG PO TBEC
40.0000 mg | DELAYED_RELEASE_TABLET | Freq: Every day | ORAL | Status: DC
  Administered 2013-12-10: 40 mg via ORAL
  Filled 2013-12-09: qty 1

## 2013-12-09 MED ORDER — ACETAMINOPHEN 650 MG RE SUPP: 650.0000 mg | Freq: Four times a day (QID) | RECTAL | Status: DC | PRN

## 2013-12-09 MED ORDER — HYDROCOD POLST-CHLORPHEN POLST 10-8 MG/5ML PO LQCR: 5.0000 mL | Freq: Two times a day (BID) | ORAL | Status: DC | PRN

## 2013-12-09 NOTE — ED Provider Notes (Addendum)
CSN: [id number]     Arrival date & time 12/09/13  0911 History   First MD Initiated Contact with Patient 12/09/13 1050     Chief Complaint  Patient presents with  . Spitting up blood      (Consider location/radiation/quality/duration/timing/severity/associated sxs/prior Treatment) HPI Comments: Patient presents to the ER for evaluation of cough. Patient reports that she has been experiencing cough for approximately 5 days. The cough has been quite severe at times. She has noticed pain in the left rib cage with cough. Cough has been productive of thick sputum which is at times blood tinged.   Past Medical History  Diagnosis Date  . Allergic rhinitis   . Diabetes mellitus     Type II  . Diverticulosis of colon   . GERD (gastroesophageal reflux disease)   . Hyperlipidemia   . Hypertension   . Hx of colonic polyps   . Gout   . Hemorrhoids   . Family history of colon cancer father  . Anemia   . Tachycardia, paroxysmal 09-2010   Past Surgical History  Procedure Laterality Date  . Abdominal hysterectomy  1972  . Cesarean section    . Cataract extraction, bilateral  February and March, 2014  . Breast lumpectomy  02/07    Left  (Young)  Benign  . Breast biopsy  02/08    left- Benign    Family History  Problem Relation Age of Onset  . Kidney disease Mother   . Colon cancer Father   . Heart disease Sister     CVA  . Lung cancer Brother   . Breast cancer      neice   History  Substance Use Topics  . Smoking status: Never Smoker   . Smokeless tobacco: Never Used  . Alcohol Use: No   OB History   Grav Para Term Preterm Abortions TAB SAB Ect Mult Living                 Review of Systems  Respiratory: Positive for cough.        Hemoptysis  All other systems reviewed and are negative.     Allergies  Sulfonamide derivatives  Home Medications   Prior to Admission medications   Medication Sig Start Date End Date Taking? Authorizing Provider  acetaminophen  (ARTHRITIS PAIN RELIEF) 650 MG CR tablet Take 650 mg by mouth every 8 (eight) hours as needed for pain.    Yes Historical Provider, MD  insulin glargine (LANTUS) 100 UNIT/ML injection Inject 15 Units into the skin at bedtime.   Yes Historical Provider, MD  loratadine (CLARITIN) 10 MG tablet Take 10 mg by mouth daily.    Yes Historical Provider, MD  metFORMIN (GLUCOPHAGE) 1000 MG tablet Take 1,000 mg by mouth 2 (two) times daily with a meal.   Yes Historical Provider, MD  nebivolol (BYSTOLIC) 5 MG tablet Take 10 mg by mouth every morning.    Yes Historical Provider, MD  pantoprazole (PROTONIX) 40 MG tablet Take 40 mg by mouth daily.   Yes Historical Provider, MD  ramipril (ALTACE) 10 MG capsule Take 10 mg by mouth every morning.   Yes Historical Provider, MD  simvastatin (ZOCOR) 20 MG tablet Take 20 mg by mouth every evening.   Yes Historical Provider, MD   BP 187/89  Pulse 112  Temp(Src) 98.6 F (37 C) (Oral)  Resp 26  SpO2 92% Physical Exam  Constitutional: She is oriented to person, place, and time. She appears well-developed and well-nourished.  No distress.  HENT:  Head: Normocephalic and atraumatic.  Right Ear: Hearing normal.  Left Ear: Hearing normal.  Nose: Nose normal.  Mouth/Throat: Oropharynx is clear and moist and mucous membranes are normal.  Eyes: Conjunctivae and EOM are normal. Pupils are equal, round, and reactive to light.  Neck: Normal range of motion. Neck supple.  Cardiovascular: Regular rhythm, S1 normal and S2 normal.  Exam reveals no gallop and no friction rub.   No murmur heard. Pulmonary/Chest: Effort normal and breath sounds normal. No respiratory distress. She exhibits no tenderness.  Abdominal: Soft. Normal appearance and bowel sounds are normal. There is no hepatosplenomegaly. There is no tenderness. There is no rebound, no guarding, no tenderness at McBurney's point and negative Murphy's sign. No hernia.  Musculoskeletal: Normal range of motion.   Neurological: She is alert and oriented to person, place, and time. She has normal strength. No cranial nerve deficit or sensory deficit. Coordination normal. GCS eye subscore is 4. GCS verbal subscore is 5. GCS motor subscore is 6.  Skin: Skin is warm, dry and intact. No rash noted. No cyanosis.  Psychiatric: She has a normal mood and affect. Her speech is normal and behavior is normal. Thought content normal.    ED Course  Procedures (including critical care time) Labs Review Labs Reviewed  CBC - Abnormal; Notable for the following:    Hemoglobin 9.7 (*)    HCT 31.9 (*)    MCV 71.2 (*)    MCH 21.7 (*)    RDW 22.5 (*)    All other components within normal limits  COMPREHENSIVE METABOLIC PANEL - Abnormal; Notable for the following:    Sodium 136 (*)    Albumin 2.6 (*)    Alkaline Phosphatase 158 (*)    GFR calc non Af Amer 85 (*)    All other components within normal limits  POC OCCULT BLOOD, ED  TYPE AND SCREEN    Imaging Review Dg Chest 2 View  12/09/2013   CLINICAL DATA:  Hemoptysis.  EXAM: CHEST  2 VIEW  COMPARISON:  None.  FINDINGS: Multiple pulmonary nodules and masses are identified. The largest lesion is in the posterior right lower lobe measuring 4.3 cm in diameter. No consolidative process or pneumothorax is seen. No pleural effusion is identified. No focal bony abnormality  IMPRESSION: Innumerable pulmonary nodules and masses consistent with neoplastic process.   Electronically Signed   By: Drusilla Kanner M.D.   On: 12/09/2013 11:43   Ct Chest W Contrast  12/09/2013   CLINICAL DATA:  Chronic anemia. Pulmonary masses and nodules by chest film.  EXAM: CT CHEST, ABDOMEN, AND PELVIS WITH CONTRAST  TECHNIQUE: Multidetector CT imaging of the chest, abdomen and pelvis was performed following the standard protocol during bolus administration of intravenous contrast.  CONTRAST:  50 mL OMNIPAQUE IOHEXOL 300 MG/ML SOLN, 100 mL OMNIPAQUE IOHEXOL 300 MG/ML SOLN  COMPARISON:  PA and  lateral chest 12/09/2013.  FINDINGS: CT CHEST FINDINGS  Enlarged precarinal lymph node measures 2.2 x 2.6 cm in diameter on image 21. A right hilar lymph node on image twenty-six measures 1.1 x 1.7 cm. Trace bilateral pleural effusions are present.  The lungs demonstrate innumerable bilateral pulmonary nodules and masses. The largest lesion is in the posterior aspect of the right lower lobe and measures 3.9 by 4.1 cm on image 41. Index lesion in the left upper lobe measures 0.7 cm in diameter on image 18. A 2.1 by 2.2 cm diameter lesion the medial aspect of  the right lower lobe appears immediately contiguous with the esophagus. Finally, an index lesion in the anterior aspect of the right upper lobe measures 1.9 by 1.8 cm on image 26. The lungs are otherwise unremarkable. No lytic or sclerotic bony lesion is identified.  CT ABDOMEN AND PELVIS FINDINGS  There is a necrotic mass in the left kidney measuring 8.4 cm transverse by 10.0 cm AP on image 75 of the axial series by 9.1 cm craniocaudal on image 50 of the coronal images. No thrombus is identified in the left renal vein. The patient also has a solid tumor in the left kidney worrisome for carcinoma. This lesion measures 2.1 cm transverse by 2.2 cm AP on image 13 of the axial series by 1.6 cm craniocaudal on image 58 of the coronal series. No thrombus is seen in the left renal vein. A 0.8 cm in diameter hyper attenuating lesion posterior to the right psoas muscle on image 75 is compatible with tumor implant.  A 1.3 cm in diameter hypo attenuating lesion in the posterior right hepatic lobe has density measurements of 1.3 and is most consistent with a cyst. The liver is otherwise unremarkable. The gallbladder, spleen, adrenal glands and pancreas appear normal. The patient is status post hysterectomy. The ovaries are unremarkable. The urinary bladder appears normal. The stomach and small and large bowel appear normal. No lymphadenopathy or fluid collection is  identified.  Image bones demonstrate a lytic lesion in the anterior left iliac wing measuring 2.4 x 1.9 cm on image 82. No other lytic lesion is identified. Advanced left hip osteoarthritis is noted.  IMPRESSION: 10 cm mass in the right kidney is consistent with renal cell carcinoma. Innumerable pulmonary nodules and masses, right hilar and precarinal lymphadenopathy and a lytic lesion the anterior left iliac wing are consistent with metastatic disease. Note is again made that a lesion in the medial aspect of the right lower lobe appears immediately contiguous with the esophagus. 0.8 cm enhancing lesion posterior to the right psoas muscle is compatible with tumor implant.  2.2 cm in diameter mass lesion in the upper pole of the left kidney is highly worrisome for renal cell carcinoma.  1.3 cm in diameter hypo attenuating lesion the liver and 0.9 cm hypoattenuating lesion in the spleen are likely cysts.  Findings were called to Dr. Malachy Moan at the time of interpretation.   Electronically Signed   By: Inge Rise M.D.   On: 12/09/2013 15:25   Ct Abdomen Pelvis W Contrast  12/09/2013   CLINICAL DATA:  Chronic anemia. Pulmonary masses and nodules by chest film.  EXAM: CT CHEST, ABDOMEN, AND PELVIS WITH CONTRAST  TECHNIQUE: Multidetector CT imaging of the chest, abdomen and pelvis was performed following the standard protocol during bolus administration of intravenous contrast.  CONTRAST:  50 mL OMNIPAQUE IOHEXOL 300 MG/ML SOLN, 100 mL OMNIPAQUE IOHEXOL 300 MG/ML SOLN  COMPARISON:  PA and lateral chest 12/09/2013.  FINDINGS: CT CHEST FINDINGS  Enlarged precarinal lymph node measures 2.2 x 2.6 cm in diameter on image 21. A right hilar lymph node on image twenty-six measures 1.1 x 1.7 cm. Trace bilateral pleural effusions are present.  The lungs demonstrate innumerable bilateral pulmonary nodules and masses. The largest lesion is in the posterior aspect of the right lower lobe and measures 3.9 by 4.1 cm on  image 41. Index lesion in the left upper lobe measures 0.7 cm in diameter on image 18. A 2.1 by 2.2 cm diameter lesion the medial aspect of  the right lower lobe appears immediately contiguous with the esophagus. Finally, an index lesion in the anterior aspect of the right upper lobe measures 1.9 by 1.8 cm on image 26. The lungs are otherwise unremarkable. No lytic or sclerotic bony lesion is identified.  CT ABDOMEN AND PELVIS FINDINGS  There is a necrotic mass in the left kidney measuring 8.4 cm transverse by 10.0 cm AP on image 75 of the axial series by 9.1 cm craniocaudal on image 50 of the coronal images. No thrombus is identified in the left renal vein. The patient also has a solid tumor in the left kidney worrisome for carcinoma. This lesion measures 2.1 cm transverse by 2.2 cm AP on image 13 of the axial series by 1.6 cm craniocaudal on image 58 of the coronal series. No thrombus is seen in the left renal vein. A 0.8 cm in diameter hyper attenuating lesion posterior to the right psoas muscle on image 75 is compatible with tumor implant.  A 1.3 cm in diameter hypo attenuating lesion in the posterior right hepatic lobe has density measurements of 1.3 and is most consistent with a cyst. The liver is otherwise unremarkable. The gallbladder, spleen, adrenal glands and pancreas appear normal. The patient is status post hysterectomy. The ovaries are unremarkable. The urinary bladder appears normal. The stomach and small and large bowel appear normal. No lymphadenopathy or fluid collection is identified.  Image bones demonstrate a lytic lesion in the anterior left iliac wing measuring 2.4 x 1.9 cm on image 82. No other lytic lesion is identified. Advanced left hip osteoarthritis is noted.  IMPRESSION: 10 cm mass in the right kidney is consistent with renal cell carcinoma. Innumerable pulmonary nodules and masses, right hilar and precarinal lymphadenopathy and a lytic lesion the anterior left iliac wing are consistent  with metastatic disease. Note is again made that a lesion in the medial aspect of the right lower lobe appears immediately contiguous with the esophagus. 0.8 cm enhancing lesion posterior to the right psoas muscle is compatible with tumor implant.  2.2 cm in diameter mass lesion in the upper pole of the left kidney is highly worrisome for renal cell carcinoma.  1.3 cm in diameter hypo attenuating lesion the liver and 0.9 cm hypoattenuating lesion in the spleen are likely cysts.  Findings were called to Dr. Malachy Moan at the time of interpretation.   Electronically Signed   By: Inge Rise M.D.   On: 12/09/2013 15:25   Dg Bone Survey Met  12/09/2013   CLINICAL DATA:  Lytic bone lesion in the pelvis.  EXAM: METASTATIC BONE SURVEY  COMPARISON:  Biopsy 12/02/2013 and chest radiograph 12/09/2013. CT of the chest, abdomen and pelvis on 12/09/2013  FINDINGS: No suspicious lucent lesions in the skull. Degenerative facet disease in the cervical spine. Multilevel degenerative disease in the cervical spine, particularly at C4-C6. The prevertebral soft tissues are normal. Thoracic spine images demonstrate large nodular lesions in the right chest. Largest lesion is at the right lung base and measures roughly 4.6 cm. No acute bone abnormality in the thoracic or lumbar spine.  Patient has a known lucent lesion involving the left iliac wing but this is not well visualized on the radiographs. No other suspicious lesions in the pelvis. Joint space narrowing in the left hip with mild protrusio deformity. There is a focal lucent area involving the right upper scapula but there does not appear to be a corresponding lesion on the recent chest CT. No suspicious lesions involving either  femur or humerus.  IMPRESSION: The known lytic lesion in the left iliac bone is not well demonstrated on this examination. There are no other definite lytic bone lesions.  Multiple right lung lesions.  Degenerative disease in the cervical and  thoracic spine.   Electronically Signed   By: Markus Daft M.D.   On: 12/09/2013 15:34     EKG Interpretation None    \  Date: 12/09/2013  Rate: 87  Rhythm: normal sinus rhythm  QRS Axis: left  Intervals: normal  ST/T Wave abnormalities: nonspecific ST/T changes  Conduction Disutrbances:right bundle branch block and left anterior fascicular block  Narrative Interpretation:   Old EKG Reviewed: unchanged    MDM   Final diagnoses:  Hemoptysis  Metastatic cancer  Renal mass    Patient presents to the ER for evaluation of cough that has now become hemoptysis. She did not appear to be any distress arrival. A chest x-ray was performed and shows multiple lesions likely metastatic cancer. She does not have any known history of cancer, although she is currently being worked up for anemia. Her CT-guided bone marrow biopsy showed a lytic lesion in her pelvis. This has not been worked up yet.  Case discussed with Doctor Benay Spice. He recommended CT chest, abdomen, pelvis for further evaluation. This reveals large renal mass, likely primary source of cancer. Patient has become more tachycardic, slightly hypoxic over the period of evaluation here in the ER. Because of this, we'll ask internal medicine to admit the patient for further management.    Orpah Greek, MD 12/09/13 [id number]  Orpah Greek, MD 12/25/13 [phone]

## 2013-12-09 NOTE — Consult Note (Signed)
[place]  Telephone:([phone]) [place] NOTE  Angelica Murphy                                MR#: [id number]  DOB: Nov 09, 1935                       CSN#: [id number]  Referring MD: Dr. Sheliah Plane Hospitalists  IM Teaching Service    MD Primary MD: Dr.  Luiz Iron for Consult:  Rule out Metastatic Renal Cell Carcinoma to lung and bone                                      Hemoptysis  [id number] Angelica Murphy is a 78 y.o. female seen at the ED for revaluation of hemoptysis in the setting of lung masses, renal masses and bone lesions and multifactorial anemia. She presented with 5 day history of hemoptysis, accompanied with  cough and Left rib cage pain. She had no other accompanying symptoms.    A CXR today showed multiple pulmonary nodules and masses, largest in the posterior right lower lobe measuring 4.3 cm No consolidative process or pneumothorax is seen. No pleural effusion. No focal bony abnormality.  CT of the chest  abdomen and pelvis performed today showed a 10 cm mass in the right kidney is consistent with renal cell carcinoma. Innumerable pulmonary nodules and masses, right hilar and precarinal lymphadenopathy and a lytic lesion the anterior left iliac wing are consistent with metastatic disease were seen. A 0.8 cm enhancing lesion posterior to the right psoas muscle is compatible with tumor implant and a  2.2 cm in  mass lesion in the upper pole of the left kidney highly worrisome for renal cell carcinoma was seen. 1.3 cm in diameter hypo attenuating lesion the liver and 0.9 cm hypoattenuating lesion in the spleen, likely cysts were noted.  Of note, she had a recent bone marrow biopsy on 4/27 for evaluation of anemia, and  a lytic lesion in the left iliac bone was noted during the procedure.  No signs of myeloma in the bone marrow were seen.  She is being admitted for further workup, including biopsy by IR for tissue diagnosis.   Hematological  History:  1. History of chronic anemia and Iron deficiency. There is an  element of iron malabsorption. However, following parenteral iron replacement she remained anemic and required a blood transfusion. Underlying bone marrow disorder was suspected. Bone marrow biopsy done on April 27 showed no major findings to suggest a myelodysplastic syndrome. Maturation is orderly with the exception of some subtle changes in the megakaryocyte line of questionable significance. No evidence for excess blasts, plasma cells, or ring sideroblasts. Iron stores are adequate.  No evidence for a hemolytic process with a normal reticulocyte count, LDH, and bilirubin.  The possibility that she has an early myelodysplastic syndrome was still being entertained but chromosome studies are pending.          Past Medical History  Diagnosis Date  . Allergic rhinitis   . Diabetes mellitus     Type II  . Diverticulosis of colon   . GERD (gastroesophageal reflux disease)   . Hyperlipidemia   . Hypertension   . Hx of colonic polyps   . Gout   . Hemorrhoids   .  Family history of colon cancer father  . Anemia-iron deficiency    . Tachycardia, paroxysmal 09-2010    Surgeries:  Past Surgical History  Procedure Laterality Date  . Abdominal hysterectomy  1972  . Cesarean section    . Cataract extraction, bilateral  February and March, 2014  . Breast lumpectomy  02/07    Left  ([place])  Benign  . Breast biopsy  02/08    left- Benign     Allergies:  Allergies  Allergen Reactions  . Sulfonamide Derivatives     REACTION: eyes red and blurry    Medications:   Prior to Admission:  (Not in a hospital admission)  PRN:    ROS: Constitutional: Denies fevers, chills or abnormal night sweats Eyes: Denies blurriness of vision, double vision or watery eyes Ears, nose, mouth, throat, and face: Denies mucositis or sore throat Respiratory: Denies cough, dyspnea or wheezes, she has spoonfull hemoptysis for the past  several days Cardiovascular: Denies palpitation, chest discomfort or lower extremity swelling Gastrointestinal:  Denies nausea, heartburn or change in bowel habits Skin: Denies abnormal skin rashes Lymphatics: Denies new lymphadenopathy or easy bruising Heme: She denies any risk factors for hepatitis or HIV. No sickle cell or thalassemia disease or trait.  Neurological:Denies numbness, tingling or new weaknesses. She does have intermittent headaches but she attributes this to seasonal sinus allergies. Behavioral/Psych: Mood is stable, no new changes  All other systems were reviewed with the patient and are negative.   Family History:    Family History  Problem Relation Age of Onset  . Kidney disease Mother   . Colon cancer Father   . Heart disease Sister     CVA  . Lung cancer Brother   . Breast cancer      neice    No family history of hematological  disorders.  Social History:  reports that she has never smoked. She has never used smokeless tobacco. She reports that she does not drink alcohol or use illicit drugs. Married. 3 children, retired Tourist information centre manager. Lives in [place]. Retired Education officer, museum  Physical Exam   ECOG PERFORMANCE STATUS:1  Filed Vitals:   12/09/13 1612  BP: 187/89  Pulse: 112  Temp:   Resp:     GENERAL:alert, no distress but somewhat dyspneic. Conversant. SKIN: skin color, texture, turgor are normal, no rashes or significant lesions EYES: normal, conjunctiva are pink and non-injected, sclera clear OROPHARYNX:no exudate, no erythema and lips, buccal mucosa, and tongue normal  NECK: supple, thyroid normal size, non-tender, without nodularity LYMPH:  no palpable lymphadenopathy in the cervical, supraclavicular, axillary or inguinal regions LUNGS: rales present at the lower posterior chest bilaterally, increased respiratory rate HEART: tachycardic , regular rate & rhythm ,? S4 gallop.  No murmurs and no lower extremity edema ABDOMEN:abdomen soft, no  mass, non-tender and normal bowel sounds. No CVAT Musculoskeletal:no cyanosis of digits and no clubbing  PSYCH: alert & oriented x 3 with fluent speech NEURO: no focal motor/sensory deficits Musculoskeletal: No tenderness at the left iliac     CMP    Recent Labs Lab 12/09/13 1025  NA 136*  K 3.8  CL 97  CO2 27  GLUCOSE 96  BUN 7  CREATININE 0.64  CALCIUM 9.6  AST 10  ALT 10  ALKPHOS 158*  BILITOT 0.6        Component Value Date/Time   BILITOT 0.6 12/09/2013 1025   BILITOT 0.51 10/23/2013 1048   BILIDIR 0.1 08/21/2012 1138     Imaging  Studies:  Dg Chest 2 View  12/09/2013   CLINICAL DATA:  Hemoptysis.  EXAM: CHEST  2 VIEW  COMPARISON:  None.  FINDINGS: Multiple pulmonary nodules and masses are identified. The largest lesion is in the posterior right lower lobe measuring 4.3 cm in diameter. No consolidative process or pneumothorax is seen. No pleural effusion is identified. No focal bony abnormality  IMPRESSION: Innumerable pulmonary nodules and masses consistent with neoplastic process.   Electronically Signed   By: Inge Rise M.D.   On: 12/09/2013 11:43   Ct Biopsy  12/02/2013   CLINICAL DATA:  Anemia  EXAM: CT-GUIDED BIOPSY BONE MARROW ASPIRATE AND CORE  MEDICATIONS AND MEDICAL HISTORY: Versed 2 mg, Fentanyl 100 mcg.  Additional Medications: None.  ANESTHESIA/SEDATION: Moderate sedation time: 10 minutes  PROCEDURE: The procedure, risks, benefits, and alternatives were explained to the patient. Questions regarding the procedure were encouraged and answered. The patient understands and consents to the procedure.  The back was prepped with Betadine in a sterile fashion, and a sterile drape was applied covering the operative field. A sterile gown and sterile gloves were used for the procedure.  Under CT guidance, an 11 gauge needle was inserted into the right iliac bone via posterior approach. Aspirate and a core were obtained. Final imaging was performed.  Patient tolerated  the procedure well without complication. Vital sign monitoring by nursing staff during the procedure will continue as patient is in the special procedures unit for post procedure observation.  FINDINGS: The images document guide needle placement within the right iliac bone via posterior approach. Post biopsy images demonstrate no hemorrhage.  A lytic bone lesion in the left iliac crest is noted.  IMPRESSION: Successful CT-guided bone marrow aspirate and core.  A left iliac lytic bone lesion was identified. Bone scan can be performed to further characterize.   Electronically Signed   By: Maryclare Bean M.D.   On: 12/02/2013 16:29   Mm Digital Screening Bilateral  12/02/2013   CLINICAL DATA:  Screening.  EXAM: DIGITAL SCREENING BILATERAL MAMMOGRAM WITH CAD  COMPARISON:  Previous exam(s).  ACR Breast Density Category c: The breast tissue is heterogeneously dense, which may obscure small masses.  FINDINGS: There are no findings suspicious for malignancy. Images were processed with CAD.  IMPRESSION: No mammographic evidence of malignancy. A result letter of this screening mammogram will be mailed directly to the patient.  RECOMMENDATION: Screening mammogram in one year. (Code:[id number]-B-01Y)  BI-RADS CATEGORY  1: Negative.   Electronically Signed   By: Marin Olp M.D.   On: 12/02/2013 14:10    COMPARISON: PA and lateral chest 12/09/2013.  FINDINGS:  CT CHEST FINDINGS  Enlarged precarinal lymph node measures 2.2 x 2.6 cm in diameter on image 21. A right hilar lymph node on image twenty-six measures 1.1 x 1.7 cm. Trace bilateral pleural effusions are present. The lungs demonstrate innumerable bilateral pulmonary nodules and masses. The largest lesion is in the posterior aspect of the right  lower lobe and measures 3.9 by 4.1 cm on image 41. Index lesion in the left upper lobe measures 0.7 cm in diameter on image 18. A 2.1 by 2.2 cm diameter lesion the medial aspect of the right lower lobe appears immediately contiguous  with the esophagus. Finally, an index lesion in the anterior aspect of the right upper lobe measures 1.9 by 1.8 cm on image 26. The lungs are otherwise unremarkable. No lytic or sclerotic bony lesion is identified.   CT ABDOMEN AND PELVIS FINDINGS  There is a necrotic mass in the left kidney measuring 8.4 cm transverse by 10.0 cm AP on image 75 of the axial series by 9.1 cm craniocaudal on image 50 of the coronal images. No thrombus is identified in the left renal vein. The patient also has a solid tumor in the left kidney worrisome for carcinoma. This lesion measures 2.1 cm transverse by 2.2 cm AP on image 13 of the axial series by 1.6 cm craniocaudal on image 58 of the coronal series. No thrombus is seen in the left renal vein. A 0.8 cm in diameter hyper attenuating lesion posterior to the right psoas muscle on image 75 is compatible with tumor implant. A 1.3 cm in diameter hypo attenuating lesion in the posterior right hepatic lobe has density measurements of 1.3 and is most consistent with a cyst. The liver is otherwise unremarkable. The gallbladder, spleen, adrenal glands and pancreas appear normal. The patient is status post hysterectomy. The ovaries are unremarkable. The urinary bladder appears normal. The stomach and small and large bowel appear normal. No lymphadenopathy or fluid collection is identified. Image bones demonstrate a lytic lesion in the anterior left iliac wing measuring 2.4 x 1.9 cm on image 82. No other lytic lesion is identified. Advanced left hip osteoarthritis is noted.   IMPRESSION: 10 cm mass in the right kidney is consistent with renal cell carcinoma. Innumerable pulmonary nodules and masses, right hilar and precarinal lymphadenopathy and a lytic lesion the anterior left iliac wing are consistent with metastatic disease. Note is again made that a lesion in the medial aspect of the right lower lobe appears immediately contiguous with the esophagus. 0.8 cm enhancing lesion posterior  to the right psoas muscle is compatible with tumor implant. 2.2 cm in diameter mass lesion in the upper pole of the left kidney is highly worrisome for renal cell carcinoma. 1.3 cm in diameter hypo attenuating lesion the liver and 0.9 cm hypoattenuating lesion in the spleen are likely cysts. Findings were called to Dr. Malachy Moan at the time of interpretation.  Electronically Signed  By: Inge Rise M.D.  On: 12/09/2013 15:25    A/P: 78 y.o. female with   1. New bilateral kidney, lung  masses with right psoas and lytic left iliac bone lesions suspicious for renal cell carcinoma.  Patient to be admitted under the Hospitalist service,. A CT guided biopsy to be performed by IR to obtain tissue diagnosis, BAsed on the results will proceed with further recommendations. If RCC, oral chemotherapy to be entertained. Will continue to follow.Thank you for the referral.  2.. History of chronic anemia and Iron deficiency. The iron deficiency is most likely related to chronic GU blood loss  3. increased respiratory rate/tachycardia/hypoxia-? Secondary to tumor in the lungs     Other medical issues as per admitting team.    Rondel Jumbo, PA-C 12/09/2013 4:25 PM Angelica Murphy was interviewed and examined. She presents with hemoptysis. She has a chronic history of iron deficiency anemia. The CT scan  evaluation today is consistent with a diagnosis of metastatic renal cell carcinoma. I discussed the CT findings and reviewed the images with Mr. Mijangos and her husband.  She appears to be in respiratory distress at present. She will be admitted for management of the dyspnea/tachycardia and to undergo a diagnostic biopsy.  I will recommend treatment with an oral VEGF receptor inhibitor if a diagnosis of metastatic renal cell carcinoma is confirmed.  Recommendations: 1. Refer to interventional radiology for a CT-guided biopsy  of a dominant lung mass or the left iliac lesion 2. Treatment with a  molecular targeted agent if a diagnosis of metastatic renal cell carcinoma is confirmed 3. Oncology will continue following her in the hospital and I will arrange for an outpatient appointment at the [facility]

## 2013-12-09 NOTE — ED Notes (Signed)
MD at bedside. Dr. Benay Spice at bedside.

## 2013-12-09 NOTE — Progress Notes (Signed)
ANTIBIOTIC CONSULT NOTE - INITIAL  Pharmacy Consult for Zosyn Indication: Aspiration PNA  Allergies  Allergen Reactions  . Sulfonamide Derivatives     REACTION: eyes red and blurry    Patient Measurements:   Adjusted Body Weight:   Vital Signs: Temp: 98.6 F (37 C) (05/04 1344) Temp src: Oral (05/04 1344) BP: 187/89 mmHg (05/04 1612) Pulse Rate: 112 (05/04 1612) Intake/Output from previous day:   Intake/Output from this shift:    Labs:  Recent Labs  12/09/13 1025  WBC 10.0  HGB 9.7*  PLT 381  CREATININE 0.64   The CrCl is unknown because both a height and weight (above a minimum accepted value) are required for this calculation. No results found for this basename: VANCOTROUGH, VANCOPEAK, VANCORANDOM, GENTTROUGH, GENTPEAK, GENTRANDOM, TOBRATROUGH, TOBRAPEAK, TOBRARND, AMIKACINPEAK, AMIKACINTROU, AMIKACIN,  in the last 72 hours   Microbiology: No results found for this or any previous visit (from the past 720 hour(s)).  Medical History: Past Medical History  Diagnosis Date  . Allergic rhinitis   . Diabetes mellitus     Type II  . Diverticulosis of colon   . GERD (gastroesophageal reflux disease)   . Hyperlipidemia   . Hypertension   . Hx of colonic polyps   . Gout   . Hemorrhoids   . Family history of colon cancer father  . Anemia   . Tachycardia, paroxysmal 09-2010   Assessment: 73 yoF admitted with CC of hemoptysis.  PMHx pertinent for HTN, HLD, DM, and anemia. CXR and CT of c/a/p concerning for metastatic renal cell cancer.  Pt admitted overnight d/t tachycardia and SOB with plan for biopsy in AM.  Pharmacy consulted to dose zosyn for possible aspiration PNA.   Antibiotic allergies: sulfonamides (red and blurry vision)  D1 Antibiotics 5/4 >> Zosyn  >>    Tmax: Afeb WBCs: 10.0K Renal:SCr 0.64  Goal of Therapy:  Eradication of infection  Plan:  Zosyn 3.375g IV q8h (infuse over 4 hours) F/u renal fxn, clinical course  Ralene Bathe, PharmD,  BCPS 12/09/2013, 5:00 PM  Pager: [phone]

## 2013-12-09 NOTE — H&P (Signed)
History and Physical       Hospital Admission Note Date: 12/09/2013  Patient name: Angelica Murphy Medical record number: [id number] Date of birth: 1936-07-12 Age: 78 y.o. Gender: female  PCP: Viviana Simpler, MD    Chief Complaint:  Hemoptysis, 2 episodes in the last 1 week  HPI: Patient is a 78 year old with history of hypertension, hyperlipidemia, diabetes mellitus who was recently seen by Dr. Beryle Beams for anemia presented to the ER with 2 episodes of hemoptysis. History was obtained from the patient who reported that her first episode of hemoptysis happened on Wednesday last week, 5 days ago associated with cough and pleuritic chest pain. Subsequently she did not have any further episodes until today when she had repeat hemoptysis about one fourth of spoon in thick sputum. She has noticed a loss of appetite and about "couple of pounds" weight loss in the last 1 month. Chest x-ray showed multiple pulmonary nodules and masses, no consolidative process or pneumothorax or any focal bony abnormality.  CT of the chest abdomen and pelvis showed 10 cm mass in the right kidney consistent with renal cell carcinoma, innumerable pulmonary nodules and masses, right hilar and precarinal lymphadenopathy, lytic lesion in the anterior left iliac wing consistent with metastasis. Oncology was consulted and patient was seen by Dr Learta Codding and tissue biopsy was recommended. However patient was noted to be tachycardiac, short of breath, satting 92% on 2 L O2 and hence oncology recommended admission overnight for biopsy in a.m.  Review of Systems:  Constitutional: Denies fever, chills, diaphoresis, ++ poor appetite and fatigue.  HEENT: Denies photophobia, eye pain, redness, hearing loss, ear pain, congestion,  rhinorrhea, sneezing, mouth sores, trouble swallowing, neck pain, neck stiffness and tinnitus.   patient has noticed some sore throat, see about  hemoptysis in history of present illness Respiratory: Please see history of present illness Cardiovascular: Denies chest pain, palpitations and leg swelling.  Gastrointestinal: Denies nausea, vomiting, abdominal pain, diarrhea, constipation, blood in stool and abdominal distention.  Genitourinary: Denies dysuria, urgency, frequency, hematuria, flank pain and difficulty urinating.  Musculoskeletal: Denies myalgias, back pain, joint swelling, arthralgias and gait problem.  Skin: Denies pallor, rash and wound.  Neurological: Denies dizziness, seizures, syncope, weakness, light-headedness, numbness and headaches.  Hematological: Please see history of present illness Psychiatric/Behavioral: Denies suicidal ideation, mood changes, confusion, nervousness, sleep disturbance and agitation  Past Medical History: Past Medical History  Diagnosis Date  . Allergic rhinitis   . Diabetes mellitus     Type II  . Diverticulosis of colon   . GERD (gastroesophageal reflux disease)   . Hyperlipidemia   . Hypertension   . Hx of colonic polyps   . Gout   . Hemorrhoids   . Family history of colon cancer father  . Anemia   . Tachycardia, paroxysmal 09-2010   Past Surgical History  Procedure Laterality Date  . Abdominal hysterectomy  1972  . Cesarean section    . Cataract extraction, bilateral  February and March, 2014  . Breast lumpectomy  02/07    Left  ([place])  Benign  . Breast biopsy  02/08    left- Benign     Medications: Prior to Admission medications   Medication Sig Start Date End Date Taking? Authorizing Provider  acetaminophen (ARTHRITIS PAIN RELIEF) 650 MG CR tablet Take 650 mg by mouth every 8 (eight) hours as needed for pain.    Yes Historical Provider, MD  insulin glargine (LANTUS) 100 UNIT/ML injection Inject 15 Units into the skin  at bedtime.   Yes Historical Provider, MD  loratadine (CLARITIN) 10 MG tablet Take 10 mg by mouth daily.    Yes Historical Provider, MD  metFORMIN  (GLUCOPHAGE) 1000 MG tablet Take 1,000 mg by mouth 2 (two) times daily with a meal.   Yes Historical Provider, MD  nebivolol (BYSTOLIC) 5 MG tablet Take 10 mg by mouth every morning.    Yes Historical Provider, MD  pantoprazole (PROTONIX) 40 MG tablet Take 40 mg by mouth daily.   Yes Historical Provider, MD  ramipril (ALTACE) 10 MG capsule Take 10 mg by mouth every morning.   Yes Historical Provider, MD  simvastatin (ZOCOR) 20 MG tablet Take 20 mg by mouth every evening.   Yes Historical Provider, MD    Allergies:   Allergies  Allergen Reactions  . Sulfonamide Derivatives     REACTION: eyes red and blurry    Social History:  reports that she has never smoked. She has never used smokeless tobacco. She reports that she does not drink alcohol or use illicit drugs.  Family History: Family History  Problem Relation Age of Onset  . Kidney disease Mother   . Colon cancer Father   . Heart disease Sister     CVA  . Lung cancer Brother   . Breast cancer      neice    Physical Exam: Blood pressure 187/89, pulse 112, temperature 98.6 F (37 C), temperature source Oral, resp. rate 26, SpO2 92.00%. General: Alert, awake, oriented x3, in no acute distress. HEENT: normocephalic, atraumatic, anicteric sclera, pink conjunctiva, pupils equal and reactive to light and accomodation, oropharynx clear Neck: supple, no masses or lymphadenopathy, no goiter, no bruits  Heart: Tachycardia, Regular rate and rhythm, without murmurs, rubs or gallops. Lungs:  rhonchi at the right lung base Abdomen: Soft, nontender, nondistended, positive bowel sounds, no masses. Extremities: No clubbing, cyanosis or edema with positive pedal pulses. Neuro: Grossly intact, no focal neurological deficits, strength 5/5 upper and lower extremities b/l Psych: alert and oriented x 3, normal mood and affect Skin: no rashes or lesions, warm and dry   LABS on Admission:  Basic Metabolic Panel:  Recent Labs Lab  12/09/13 1025  NA 136*  K 3.8  CL 97  CO2 27  GLUCOSE 96  BUN 7  CREATININE 0.64  CALCIUM 9.6   Liver Function Tests:  Recent Labs Lab 12/09/13 1025  AST 10  ALT 10  ALKPHOS 158*  BILITOT 0.6  PROT 7.5  ALBUMIN 2.6*   No results found for this basename: LIPASE, AMYLASE,  in the last 168 hours No results found for this basename: AMMONIA,  in the last 168 hours CBC:  Recent Labs Lab 12/09/13 1025  WBC 10.0  HGB 9.7*  HCT 31.9*  MCV 71.2*  PLT 381   Cardiac Enzymes: No results found for this basename: CKTOTAL, CKMB, CKMBINDEX, TROPONINI,  in the last 168 hours BNP: No components found with this basename: POCBNP,  CBG: No results found for this basename: GLUCAP,  in the last 168 hours   Radiological Exams on Admission: Dg Chest 2 View  12/09/2013   CLINICAL DATA:  Hemoptysis.  EXAM: CHEST  2 VIEW  COMPARISON:  None.  FINDINGS: Multiple pulmonary nodules and masses are identified. The largest lesion is in the posterior right lower lobe measuring 4.3 cm in diameter. No consolidative process or pneumothorax is seen. No pleural effusion is identified. No focal bony abnormality  IMPRESSION: Innumerable pulmonary nodules and masses consistent with  neoplastic process.   Electronically Signed   By: Inge Rise M.D.   On: 12/09/2013 11:43   Ct Chest W Contrast  12/09/2013   CLINICAL DATA:  Chronic anemia. Pulmonary masses and nodules by chest film.  EXAM: CT CHEST, ABDOMEN, AND PELVIS WITH CONTRAST  TECHNIQUE: Multidetector CT imaging of the chest, abdomen and pelvis was performed following the standard protocol during bolus administration of intravenous contrast.  CONTRAST:  50 mL OMNIPAQUE IOHEXOL 300 MG/ML SOLN, 100 mL OMNIPAQUE IOHEXOL 300 MG/ML SOLN  COMPARISON:  PA and lateral chest 12/09/2013.  FINDINGS: CT CHEST FINDINGS  Enlarged precarinal lymph node measures 2.2 x 2.6 cm in diameter on image 21. A right hilar lymph node on image twenty-six measures 1.1 x 1.7 cm.  Trace bilateral pleural effusions are present.  The lungs demonstrate innumerable bilateral pulmonary nodules and masses. The largest lesion is in the posterior aspect of the right lower lobe and measures 3.9 by 4.1 cm on image 41. Index lesion in the left upper lobe measures 0.7 cm in diameter on image 18. A 2.1 by 2.2 cm diameter lesion the medial aspect of the right lower lobe appears immediately contiguous with the esophagus. Finally, an index lesion in the anterior aspect of the right upper lobe measures 1.9 by 1.8 cm on image 26. The lungs are otherwise unremarkable. No lytic or sclerotic bony lesion is identified.  CT ABDOMEN AND PELVIS FINDINGS  There is a necrotic mass in the left kidney measuring 8.4 cm transverse by 10.0 cm AP on image 75 of the axial series by 9.1 cm craniocaudal on image 50 of the coronal images. No thrombus is identified in the left renal vein. The patient also has a solid tumor in the left kidney worrisome for carcinoma. This lesion measures 2.1 cm transverse by 2.2 cm AP on image 13 of the axial series by 1.6 cm craniocaudal on image 58 of the coronal series. No thrombus is seen in the left renal vein. A 0.8 cm in diameter hyper attenuating lesion posterior to the right psoas muscle on image 75 is compatible with tumor implant.  A 1.3 cm in diameter hypo attenuating lesion in the posterior right hepatic lobe has density measurements of 1.3 and is most consistent with a cyst. The liver is otherwise unremarkable. The gallbladder, spleen, adrenal glands and pancreas appear normal. The patient is status post hysterectomy. The ovaries are unremarkable. The urinary bladder appears normal. The stomach and small and large bowel appear normal. No lymphadenopathy or fluid collection is identified.  Image bones demonstrate a lytic lesion in the anterior left iliac wing measuring 2.4 x 1.9 cm on image 82. No other lytic lesion is identified. Advanced left hip osteoarthritis is noted.   IMPRESSION: 10 cm mass in the right kidney is consistent with renal cell carcinoma. Innumerable pulmonary nodules and masses, right hilar and precarinal lymphadenopathy and a lytic lesion the anterior left iliac wing are consistent with metastatic disease. Note is again made that a lesion in the medial aspect of the right lower lobe appears immediately contiguous with the esophagus. 0.8 cm enhancing lesion posterior to the right psoas muscle is compatible with tumor implant.  2.2 cm in diameter mass lesion in the upper pole of the left kidney is highly worrisome for renal cell carcinoma.  1.3 cm in diameter hypo attenuating lesion the liver and 0.9 cm hypoattenuating lesion in the spleen are likely cysts.  Findings were called to Dr. Malachy Moan at the time of  interpretation.   Electronically Signed   By: Inge Rise M.D.   On: 12/09/2013 15:25   Ct Abdomen Pelvis W Contrast  12/09/2013   CLINICAL DATA:  Chronic anemia. Pulmonary masses and nodules by chest film.  EXAM: CT CHEST, ABDOMEN, AND PELVIS WITH CONTRAST  TECHNIQUE: Multidetector CT imaging of the chest, abdomen and pelvis was performed following the standard protocol during bolus administration of intravenous contrast.  CONTRAST:  50 mL OMNIPAQUE IOHEXOL 300 MG/ML SOLN, 100 mL OMNIPAQUE IOHEXOL 300 MG/ML SOLN  COMPARISON:  PA and lateral chest 12/09/2013.  FINDINGS: CT CHEST FINDINGS  Enlarged precarinal lymph node measures 2.2 x 2.6 cm in diameter on image 21. A right hilar lymph node on image twenty-six measures 1.1 x 1.7 cm. Trace bilateral pleural effusions are present.  The lungs demonstrate innumerable bilateral pulmonary nodules and masses. The largest lesion is in the posterior aspect of the right lower lobe and measures 3.9 by 4.1 cm on image 41. Index lesion in the left upper lobe measures 0.7 cm in diameter on image 18. A 2.1 by 2.2 cm diameter lesion the medial aspect of the right lower lobe appears immediately contiguous with the  esophagus. Finally, an index lesion in the anterior aspect of the right upper lobe measures 1.9 by 1.8 cm on image 26. The lungs are otherwise unremarkable. No lytic or sclerotic bony lesion is identified.  CT ABDOMEN AND PELVIS FINDINGS  There is a necrotic mass in the left kidney measuring 8.4 cm transverse by 10.0 cm AP on image 75 of the axial series by 9.1 cm craniocaudal on image 50 of the coronal images. No thrombus is identified in the left renal vein. The patient also has a solid tumor in the left kidney worrisome for carcinoma. This lesion measures 2.1 cm transverse by 2.2 cm AP on image 13 of the axial series by 1.6 cm craniocaudal on image 58 of the coronal series. No thrombus is seen in the left renal vein. A 0.8 cm in diameter hyper attenuating lesion posterior to the right psoas muscle on image 75 is compatible with tumor implant.  A 1.3 cm in diameter hypo attenuating lesion in the posterior right hepatic lobe has density measurements of 1.3 and is most consistent with a cyst. The liver is otherwise unremarkable. The gallbladder, spleen, adrenal glands and pancreas appear normal. The patient is status post hysterectomy. The ovaries are unremarkable. The urinary bladder appears normal. The stomach and small and large bowel appear normal. No lymphadenopathy or fluid collection is identified.  Image bones demonstrate a lytic lesion in the anterior left iliac wing measuring 2.4 x 1.9 cm on image 82. No other lytic lesion is identified. Advanced left hip osteoarthritis is noted.  IMPRESSION: 10 cm mass in the right kidney is consistent with renal cell carcinoma. Innumerable pulmonary nodules and masses, right hilar and precarinal lymphadenopathy and a lytic lesion the anterior left iliac wing are consistent with metastatic disease. Note is again made that a lesion in the medial aspect of the right lower lobe appears immediately contiguous with the esophagus. 0.8 cm enhancing lesion posterior to the right  psoas muscle is compatible with tumor implant.  2.2 cm in diameter mass lesion in the upper pole of the left kidney is highly worrisome for renal cell carcinoma.  1.3 cm in diameter hypo attenuating lesion the liver and 0.9 cm hypoattenuating lesion in the spleen are likely cysts.  Findings were called to Dr. Malachy Moan at the time of  interpretation.   Electronically Signed   By: Inge Rise M.D.   On: 12/09/2013 15:25   Ct Biopsy  12/02/2013   CLINICAL DATA:  Anemia  EXAM: CT-GUIDED BIOPSY BONE MARROW ASPIRATE AND CORE  MEDICATIONS AND MEDICAL HISTORY: Versed 2 mg, Fentanyl 100 mcg.  Additional Medications: None.  ANESTHESIA/SEDATION: Moderate sedation time: 10 minutes  PROCEDURE: The procedure, risks, benefits, and alternatives were explained to the patient. Questions regarding the procedure were encouraged and answered. The patient understands and consents to the procedure.  The back was prepped with Betadine in a sterile fashion, and a sterile drape was applied covering the operative field. A sterile gown and sterile gloves were used for the procedure.  Under CT guidance, an 11 gauge needle was inserted into the right iliac bone via posterior approach. Aspirate and a core were obtained. Final imaging was performed.  Patient tolerated the procedure well without complication. Vital sign monitoring by nursing staff during the procedure will continue as patient is in the special procedures unit for post procedure observation.  FINDINGS: The images document guide needle placement within the right iliac bone via posterior approach. Post biopsy images demonstrate no hemorrhage.  A lytic bone lesion in the left iliac crest is noted.  IMPRESSION: Successful CT-guided bone marrow aspirate and core.  A left iliac lytic bone lesion was identified. Bone scan can be performed to further characterize.   Electronically Signed   By: Maryclare Bean M.D.   On: 12/02/2013 16:29   Dg Bone Survey Met  12/09/2013   CLINICAL  DATA:  Lytic bone lesion in the pelvis.  EXAM: METASTATIC BONE SURVEY  COMPARISON:  Biopsy 12/02/2013 and chest radiograph 12/09/2013. CT of the chest, abdomen and pelvis on 12/09/2013  FINDINGS: No suspicious lucent lesions in the skull. Degenerative facet disease in the cervical spine. Multilevel degenerative disease in the cervical spine, particularly at C4-C6. The prevertebral soft tissues are normal. Thoracic spine images demonstrate large nodular lesions in the right chest. Largest lesion is at the right lung base and measures roughly 4.6 cm. No acute bone abnormality in the thoracic or lumbar spine.  Patient has a known lucent lesion involving the left iliac wing but this is not well visualized on the radiographs. No other suspicious lesions in the pelvis. Joint space narrowing in the left hip with mild protrusio deformity. There is a focal lucent area involving the right upper scapula but there does not appear to be a corresponding lesion on the recent chest CT. No suspicious lesions involving either femur or humerus.  IMPRESSION: The known lytic lesion in the left iliac bone is not well demonstrated on this examination. There are no other definite lytic bone lesions.  Multiple right lung lesions.  Degenerative disease in the cervical and thoracic spine.   Electronically Signed   By: Markus Daft M.D.   On: 12/09/2013 15:34     Assessment/Plan Principal Problem: Acute hypoxic respiratory failure with  Hemoptysis: Possibly due to lung metastasis from possible renal cell carcinoma, also possibility of aspiration pneumonitis - Will admit to telemetry, placed on O2 via nasal cannula, Xopenex nebs, Tussionex, Tessalon Perles, placed on IV Zosyn - No pulmonary embolism on the CT CHEST   Active Problems: Renal cell carcinoma : With lung masses, lytic left iliac bone lesions suspicious for metastasis, new diagnosis - Oncology consulted, will consult interventional radiology for CT-guided biopsy  - N.p.o.  after midnight  Anemia: - Chronic issue, patient was following hematology/oncology, patient had bone  marrow biopsy on 4/27 which showed no major findings suggestive of myelodysplastic syndrome. - Oncology will be following, transfuse as needed for hemoglobin less than 8     DIABETES MELLITUS, TYPE II - Continue Lantus, decreased to 10 units as patient will be n.p.o. after midnight - Placed on sliding scale insulin    HYPERTENSION - Placed on beta blocker, however hold ACE inhibitor due to recent cough issues    GERD - Continue PPI  DVT prophylaxis: SCDs  CODE STATUS: I discussed in detail with the patient and she opted to be full CODE STATUS  Family Communication: Admission, patients condition and plan of care including tests being ordered have been discussed with the patient and her husband at bed side who indicates understanding and agree with the plan and Code Status   Further plan will depend as patient's clinical course evolves and further radiologic and laboratory data become available.   Time Spent on Admission: 1 hour  Ripudeep Krystal Eaton M.D. [facility] 12/09/2013, 4:38 PM Pager: [phone]  If 7PM-7AM, please contact night-coverage [email or web address] Password [email or web address]  **Disclaimer: This note was dictated with voice recognition software. Similar sounding words can inadvertently be transcribed and this note may contain transcription errors which may not have been corrected upon publication of note.**

## 2013-12-09 NOTE — ED Notes (Signed)
Bed: WA17 Expected date:  Expected time:  Means of arrival:  Comments: Hold Triage 1

## 2013-12-09 NOTE — ED Notes (Signed)
Pt c/o intermittent "spitting up blood" x 5 days and L side ribcage pain increasing w/ coughing x 6 days.  Pain score 3/10.

## 2013-12-10 ENCOUNTER — Inpatient Hospital Stay (HOSPITAL_COMMUNITY): Payer: Medicare Other

## 2013-12-10 ENCOUNTER — Encounter (HOSPITAL_COMMUNITY): Payer: Self-pay | Admitting: Radiology

## 2013-12-10 ENCOUNTER — Other Ambulatory Visit: Payer: Self-pay | Admitting: Nurse Practitioner

## 2013-12-10 DIAGNOSIS — C649 Malignant neoplasm of unspecified kidney, except renal pelvis: Secondary | ICD-10-CM

## 2013-12-10 DIAGNOSIS — C7952 Secondary malignant neoplasm of bone marrow: Secondary | ICD-10-CM

## 2013-12-10 DIAGNOSIS — C7951 Secondary malignant neoplasm of bone: Secondary | ICD-10-CM

## 2013-12-10 DIAGNOSIS — C7800 Secondary malignant neoplasm of unspecified lung: Secondary | ICD-10-CM

## 2013-12-10 LAB — CBC
HEMATOCRIT: 31.6 % — AB (ref 36.0–46.0)
HEMOGLOBIN: 9.5 g/dL — AB (ref 12.0–15.0)
MCH: 21.4 pg — AB (ref 26.0–34.0)
MCHC: 30.1 g/dL (ref 30.0–36.0)
MCV: 71.2 fL — AB (ref 78.0–100.0)
Platelets: 473 10*3/uL — ABNORMAL HIGH (ref 150–400)
RBC: 4.44 MIL/uL (ref 3.87–5.11)
RDW: 22.8 % — ABNORMAL HIGH (ref 11.5–15.5)
WBC: 10.1 10*3/uL (ref 4.0–10.5)

## 2013-12-10 LAB — COMPREHENSIVE METABOLIC PANEL
ALT: 9 U/L (ref 0–35)
AST: 11 U/L (ref 0–37)
Albumin: 2.4 g/dL — ABNORMAL LOW (ref 3.5–5.2)
Alkaline Phosphatase: 149 U/L — ABNORMAL HIGH (ref 39–117)
BILIRUBIN TOTAL: 0.9 mg/dL (ref 0.3–1.2)
BUN: 5 mg/dL — ABNORMAL LOW (ref 6–23)
CALCIUM: 9.1 mg/dL (ref 8.4–10.5)
CO2: 28 meq/L (ref 19–32)
CREATININE: 0.69 mg/dL (ref 0.50–1.10)
Chloride: 99 mEq/L (ref 96–112)
GFR, EST NON AFRICAN AMERICAN: 83 mL/min — AB (ref >90)
GLUCOSE: 142 mg/dL — AB (ref 70–99)
Potassium: 3.8 mEq/L (ref 3.7–5.3)
Sodium: 138 mEq/L (ref 137–147)
Total Protein: 6.7 g/dL (ref 6.0–8.3)

## 2013-12-10 LAB — PROTIME-INR
INR: 1.31 (ref 0.00–1.49)
Prothrombin Time: 16 seconds — ABNORMAL HIGH (ref 11.6–15.2)

## 2013-12-10 LAB — GLUCOSE, CAPILLARY
Glucose-Capillary: 149 mg/dL — ABNORMAL HIGH (ref 70–99)
Glucose-Capillary: 125 mg/dL — ABNORMAL HIGH (ref 70–99)

## 2013-12-10 LAB — URINE CULTURE
CULTURE: NO GROWTH
Colony Count: NO GROWTH

## 2013-12-10 LAB — HEMOGLOBIN A1C
Hgb A1c MFr Bld: 6.9 % — ABNORMAL HIGH (ref <5.7)
Mean Plasma Glucose: 151 mg/dL — ABNORMAL HIGH (ref <117)

## 2013-12-10 MED ORDER — HYDROCODONE-ACETAMINOPHEN 5-325 MG PO TABS: 1.0000 | ORAL_TABLET | Freq: Four times a day (QID) | ORAL | Status: DC | PRN

## 2013-12-10 MED ORDER — AMOXICILLIN-POT CLAVULANATE 875-125 MG PO TABS: 1.0000 | ORAL_TABLET | Freq: Two times a day (BID) | ORAL | Status: DC

## 2013-12-10 MED ORDER — BENZONATATE 100 MG PO CAPS: 100.0000 mg | ORAL_CAPSULE | Freq: Three times a day (TID) | ORAL | Status: DC

## 2013-12-10 MED ORDER — AMOXICILLIN-POT CLAVULANATE 875-125 MG PO TABS
1.0000 | ORAL_TABLET | Freq: Two times a day (BID) | ORAL | Status: DC
  Filled 2013-12-10 (×2): qty 1

## 2013-12-10 MED ORDER — FENTANYL CITRATE 0.05 MG/ML IJ SOLN
INTRAMUSCULAR | Status: AC
  Filled 2013-12-10: qty 4

## 2013-12-10 MED ORDER — GLUCERNA SHAKE PO LIQD: 237.0000 mL | ORAL | Status: DC

## 2013-12-10 MED ORDER — MIDAZOLAM HCL 2 MG/2ML IJ SOLN
INTRAMUSCULAR | Status: AC
  Filled 2013-12-10: qty 4

## 2013-12-10 MED ORDER — LEVALBUTEROL HCL 0.63 MG/3ML IN NEBU: 0.6300 mg | INHALATION_SOLUTION | Freq: Four times a day (QID) | RESPIRATORY_TRACT | Status: DC | PRN

## 2013-12-10 MED ORDER — FENTANYL CITRATE 0.05 MG/ML IJ SOLN
INTRAMUSCULAR | Status: AC | PRN
  Administered 2013-12-10: 50 ug via INTRAVENOUS

## 2013-12-10 MED ORDER — MIDAZOLAM HCL 2 MG/2ML IJ SOLN
INTRAMUSCULAR | Status: AC | PRN
  Administered 2013-12-10 (×2): 1 mg via INTRAVENOUS

## 2013-12-10 NOTE — Care Management Note (Signed)
    Page 1 of 1   12/10/2013     2:30:55 PM CARE MANAGEMENT NOTE 12/10/2013  Patient:  Angelica Murphy, Angelica Murphy   Account Number:  [id number]  Date Initiated:  12/10/2013  Documentation initiated by:  Dessa Phi  Subjective/Objective Assessment:   78 Y/O F ADMITTED W/HEMOPTYSIS.ZG:YFVCBS,WH.     Action/Plan:   FROM HOME W/SPOUSE,VERY SUPPORTIVE FAMILY.HAS PCP,PHARMACY.   Anticipated DC Date:  12/13/2013   Anticipated DC Plan:  [place]  CM consult      Choice offered to / List presented to:             Status of service:  In process, will continue to follow Medicare Important Message given?  YES (If response is "NO", the following Medicare IM given date fields will be blank) Date Medicare IM given:  12/10/2013 Date Additional Medicare IM given:    Discharge Disposition:    Per UR Regulation:  Reviewed for med. necessity/level of care/duration of stay  If discussed at Long Length of Stay Meetings, dates discussed:    Comments:  12/10/13 Stetson Pelaez RN,BSN NCM [phone] BX-L ILIAC BONE LESION.IVF@75 ,IV ABX,NEBS.OT-NO F/U.NO ANTICIPATED D/C NEEDS.

## 2013-12-10 NOTE — Progress Notes (Signed)
Agree.  Imaging reviewed and c/w probable metastatic right renal cell carcinoma.  Safest lesion to biopsy is destructive lesion of left iliac bone under CT guidance.

## 2013-12-10 NOTE — Progress Notes (Signed)
Pt was lying in bed when I arrived. Pt's husband, daughter, and son-in-law were bedside. I met pt's husband (family is an acquaintance) on the elevator who told me of pt's condition. During our visit pt was delightful and cheerful.  Pt was thankful for visit and prayer. Pt's daughter expressed hope during our conversation (in the hallway) and glad for diagnosis and faith. Ernest Haber Chaplain  12/10/13 1300  Clinical Encounter Type  Visited With Patient and family together

## 2013-12-10 NOTE — Progress Notes (Signed)
Patient ID: Angelica Murphy, female   DOB: 04-09-1936, 78 y.o.   MRN: [id number] Very sweet 78 year old woman who has been under evaluation for chronic microcytic anemia. She had a negative GI evaluation. Negative evaluation for hemolysis. She had a bone marrow biopsy on April 27 under CT guidance. Bone marrow was nondiagnostic. Incidentally noted on the CT scan was a large lytic lesion in the left iliac bone. I scheduled regular bone x-rays and had  planned to have a needle biopsy of the bone lesion done as an outpatient. She called yesterday to report sudden onset of hemoptysis. I instructed her to come to the hospital immediately for further evaluation. A routine chest radiograph showed multiple pulmonary lesions. This was followed by a CT scan of the chest abdomen and pelvis which shows a large 8 x 10 cm left kidney mass. Multiple bilateral lung lesions are confirmed the largest approximately 4 cm. One of the lesions in the right lower lobe appears contiguous with the esophagus. No additional bone lesions were seen on a skeletal survey or on the CT scan.  I asked my partner Dr. Benay Spice to evaluate the patient since I am no longer managing solid tumors. A biopsy of an approachable lesion will be done today. We may need to get GI involved to exclude erosive tumor as etiology of her hemoptysis.  I discussed the diagnosis and available treatment options with the patient and one of her daughters. I told her that this was a malignant tumor that has spread to her bone and her lungs. It cannot be cured but it can be controlled. We have made some significant progress in the treatment of kidney cancers in the last few years. She will be offered a tyrosine kinase inhibitor. First line treatment is usually sunitinib which is given in pill form. I will be available for support. Thank you for your assistance in managing this patient.

## 2013-12-10 NOTE — Procedures (Signed)
Procedure:  CT guided biopsy of left iliac bone lesion Findings:  Destructive lesion of left iliac bone sampled from posterior approach.  18 G core x 3 via 17 G needle.  Solid tissue.  No immediate complication.

## 2013-12-10 NOTE — Progress Notes (Signed)
Patient ID: Angelica Murphy, female   DOB: 09-01-1935, 78 y.o.   MRN: [id number] Request received for CT guided biopsy of a left iliac bone lesion in pt with hx of hemoptysis, anemia,  renal masses, and lung lesions with assoc lymphadenopathy worrisome for metastatic RCC. Imaging studies were reviewed by Dr. Kathlene Cote. Additional PMH as below. Exam: pt awake/alert; chest- CTA bilat; heart- RRR; abd- soft,+BS,NT; ext- FROM, no edema.    Filed Vitals:   12/09/13 1724 12/09/13 1930 12/09/13 2146 12/10/13 0416  BP: 175/81  158/78 159/74  Pulse: 106  106 103  Temp: 98.2 F (36.8 C)  98.1 F (36.7 C) 98.2 F (36.8 C)  TempSrc: Oral  Oral Oral  Resp: _0 Height: 5' 3" (1.6 m)     Weight: 130 lb 8.2 oz (59.2 kg)     SpO2: 93% 93% 99% 99%   Past Medical History  Diagnosis Date  . Allergic rhinitis   . Diabetes mellitus     Type II  . Diverticulosis of colon   . GERD (gastroesophageal reflux disease)   . Hyperlipidemia   . Hypertension   . Hx of colonic polyps   . Gout   . Hemorrhoids   . Family history of colon cancer father  . Anemia   . Tachycardia, paroxysmal 09-2010   Past Surgical History  Procedure Laterality Date  . Abdominal hysterectomy  1972  . Cesarean section    . Cataract extraction, bilateral  February and March, 2014  . Breast lumpectomy  02/07    Left  (Young)  Benign  . Breast biopsy  02/08    left- Benign    Dg Chest 2 View  12/09/2013   CLINICAL DATA:  Hemoptysis.  EXAM: CHEST  2 VIEW  COMPARISON:  None.  FINDINGS: Multiple pulmonary nodules and masses are identified. The largest lesion is in the posterior right lower lobe measuring 4.3 cm in diameter. No consolidative process or pneumothorax is seen. No pleural effusion is identified. No focal bony abnormality  IMPRESSION: Innumerable pulmonary nodules and masses consistent with neoplastic process.   Electronically Signed   By: Inge Rise M.D.   On: 12/09/2013 11:43   Ct Chest W Contrast  12/09/2013    CLINICAL DATA:  Chronic anemia. Pulmonary masses and nodules by chest film.  EXAM: CT CHEST, ABDOMEN, AND PELVIS WITH CONTRAST  TECHNIQUE: Multidetector CT imaging of the chest, abdomen and pelvis was performed following the standard protocol during bolus administration of intravenous contrast.  CONTRAST:  50 mL OMNIPAQUE IOHEXOL 300 MG/ML SOLN, 100 mL OMNIPAQUE IOHEXOL 300 MG/ML SOLN  COMPARISON:  PA and lateral chest 12/09/2013.  FINDINGS: CT CHEST FINDINGS  Enlarged precarinal lymph node measures 2.2 x 2.6 cm in diameter on image 21. A right hilar lymph node on image twenty-six measures 1.1 x 1.7 cm. Trace bilateral pleural effusions are present.  The lungs demonstrate innumerable bilateral pulmonary nodules and masses. The largest lesion is in the posterior aspect of the right lower lobe and measures 3.9 by 4.1 cm on image 41. Index lesion in the left upper lobe measures 0.7 cm in diameter on image 18. A 2.1 by 2.2 cm diameter lesion the medial aspect of the right lower lobe appears immediately contiguous with the esophagus. Finally, an index lesion in the anterior aspect of the right upper lobe measures 1.9 by 1.8 cm on image 26. The lungs are otherwise unremarkable. No lytic or sclerotic bony lesion is identified.  CT  ABDOMEN AND PELVIS FINDINGS  There is a necrotic mass in the left kidney measuring 8.4 cm transverse by 10.0 cm AP on image 75 of the axial series by 9.1 cm craniocaudal on image 50 of the coronal images. No thrombus is identified in the left renal vein. The patient also has a solid tumor in the left kidney worrisome for carcinoma. This lesion measures 2.1 cm transverse by 2.2 cm AP on image 13 of the axial series by 1.6 cm craniocaudal on image 58 of the coronal series. No thrombus is seen in the left renal vein. A 0.8 cm in diameter hyper attenuating lesion posterior to the right psoas muscle on image 75 is compatible with tumor implant.  A 1.3 cm in diameter hypo attenuating lesion in the  posterior right hepatic lobe has density measurements of 1.3 and is most consistent with a cyst. The liver is otherwise unremarkable. The gallbladder, spleen, adrenal glands and pancreas appear normal. The patient is status post hysterectomy. The ovaries are unremarkable. The urinary bladder appears normal. The stomach and small and large bowel appear normal. No lymphadenopathy or fluid collection is identified.  Image bones demonstrate a lytic lesion in the anterior left iliac wing measuring 2.4 x 1.9 cm on image 82. No other lytic lesion is identified. Advanced left hip osteoarthritis is noted.  IMPRESSION: 10 cm mass in the right kidney is consistent with renal cell carcinoma. Innumerable pulmonary nodules and masses, right hilar and precarinal lymphadenopathy and a lytic lesion the anterior left iliac wing are consistent with metastatic disease. Note is again made that a lesion in the medial aspect of the right lower lobe appears immediately contiguous with the esophagus. 0.8 cm enhancing lesion posterior to the right psoas muscle is compatible with tumor implant.  2.2 cm in diameter mass lesion in the upper pole of the left kidney is highly worrisome for renal cell carcinoma.  1.3 cm in diameter hypo attenuating lesion the liver and 0.9 cm hypoattenuating lesion in the spleen are likely cysts.  Findings were called to Dr. Malachy Moan at the time of interpretation.   Electronically Signed   By: Inge Rise M.D.   On: 12/09/2013 15:25   Ct Abdomen Pelvis W Contrast  12/09/2013   CLINICAL DATA:  Chronic anemia. Pulmonary masses and nodules by chest film.  EXAM: CT CHEST, ABDOMEN, AND PELVIS WITH CONTRAST  TECHNIQUE: Multidetector CT imaging of the chest, abdomen and pelvis was performed following the standard protocol during bolus administration of intravenous contrast.  CONTRAST:  50 mL OMNIPAQUE IOHEXOL 300 MG/ML SOLN, 100 mL OMNIPAQUE IOHEXOL 300 MG/ML SOLN  COMPARISON:  PA and lateral chest  12/09/2013.  FINDINGS: CT CHEST FINDINGS  Enlarged precarinal lymph node measures 2.2 x 2.6 cm in diameter on image 21. A right hilar lymph node on image twenty-six measures 1.1 x 1.7 cm. Trace bilateral pleural effusions are present.  The lungs demonstrate innumerable bilateral pulmonary nodules and masses. The largest lesion is in the posterior aspect of the right lower lobe and measures 3.9 by 4.1 cm on image 41. Index lesion in the left upper lobe measures 0.7 cm in diameter on image 18. A 2.1 by 2.2 cm diameter lesion the medial aspect of the right lower lobe appears immediately contiguous with the esophagus. Finally, an index lesion in the anterior aspect of the right upper lobe measures 1.9 by 1.8 cm on image 26. The lungs are otherwise unremarkable. No lytic or sclerotic bony lesion is identified.  CT  ABDOMEN AND PELVIS FINDINGS  There is a necrotic mass in the left kidney measuring 8.4 cm transverse by 10.0 cm AP on image 75 of the axial series by 9.1 cm craniocaudal on image 50 of the coronal images. No thrombus is identified in the left renal vein. The patient also has a solid tumor in the left kidney worrisome for carcinoma. This lesion measures 2.1 cm transverse by 2.2 cm AP on image 13 of the axial series by 1.6 cm craniocaudal on image 58 of the coronal series. No thrombus is seen in the left renal vein. A 0.8 cm in diameter hyper attenuating lesion posterior to the right psoas muscle on image 75 is compatible with tumor implant.  A 1.3 cm in diameter hypo attenuating lesion in the posterior right hepatic lobe has density measurements of 1.3 and is most consistent with a cyst. The liver is otherwise unremarkable. The gallbladder, spleen, adrenal glands and pancreas appear normal. The patient is status post hysterectomy. The ovaries are unremarkable. The urinary bladder appears normal. The stomach and small and large bowel appear normal. No lymphadenopathy or fluid collection is identified.  Image  bones demonstrate a lytic lesion in the anterior left iliac wing measuring 2.4 x 1.9 cm on image 82. No other lytic lesion is identified. Advanced left hip osteoarthritis is noted.  IMPRESSION: 10 cm mass in the right kidney is consistent with renal cell carcinoma. Innumerable pulmonary nodules and masses, right hilar and precarinal lymphadenopathy and a lytic lesion the anterior left iliac wing are consistent with metastatic disease. Note is again made that a lesion in the medial aspect of the right lower lobe appears immediately contiguous with the esophagus. 0.8 cm enhancing lesion posterior to the right psoas muscle is compatible with tumor implant.  2.2 cm in diameter mass lesion in the upper pole of the left kidney is highly worrisome for renal cell carcinoma.  1.3 cm in diameter hypo attenuating lesion the liver and 0.9 cm hypoattenuating lesion in the spleen are likely cysts.  Findings were called to Dr. Malachy Moan at the time of interpretation.   Electronically Signed   By: Inge Rise M.D.   On: 12/09/2013 15:25   Ct Biopsy  12/02/2013   CLINICAL DATA:  Anemia  EXAM: CT-GUIDED BIOPSY BONE MARROW ASPIRATE AND CORE  MEDICATIONS AND MEDICAL HISTORY: Versed 2 mg, Fentanyl 100 mcg.  Additional Medications: None.  ANESTHESIA/SEDATION: Moderate sedation time: 10 minutes  PROCEDURE: The procedure, risks, benefits, and alternatives were explained to the patient. Questions regarding the procedure were encouraged and answered. The patient understands and consents to the procedure.  The back was prepped with Betadine in a sterile fashion, and a sterile drape was applied covering the operative field. A sterile gown and sterile gloves were used for the procedure.  Under CT guidance, an 11 gauge needle was inserted into the right iliac bone via posterior approach. Aspirate and a core were obtained. Final imaging was performed.  Patient tolerated the procedure well without complication. Vital sign monitoring by  nursing staff during the procedure will continue as patient is in the special procedures unit for post procedure observation.  FINDINGS: The images document guide needle placement within the right iliac bone via posterior approach. Post biopsy images demonstrate no hemorrhage.  A lytic bone lesion in the left iliac crest is noted.  IMPRESSION: Successful CT-guided bone marrow aspirate and core.  A left iliac lytic bone lesion was identified. Bone scan can be performed to further characterize.  Electronically Signed   By: Maryclare Bean M.D.   On: 12/02/2013 16:29   Dg Bone Survey Met  12/09/2013   CLINICAL DATA:  Lytic bone lesion in the pelvis.  EXAM: METASTATIC BONE SURVEY  COMPARISON:  Biopsy 12/02/2013 and chest radiograph 12/09/2013. CT of the chest, abdomen and pelvis on 12/09/2013  FINDINGS: No suspicious lucent lesions in the skull. Degenerative facet disease in the cervical spine. Multilevel degenerative disease in the cervical spine, particularly at C4-C6. The prevertebral soft tissues are normal. Thoracic spine images demonstrate large nodular lesions in the right chest. Largest lesion is at the right lung base and measures roughly 4.6 cm. No acute bone abnormality in the thoracic or lumbar spine.  Patient has a known lucent lesion involving the left iliac wing but this is not well visualized on the radiographs. No other suspicious lesions in the pelvis. Joint space narrowing in the left hip with mild protrusio deformity. There is a focal lucent area involving the right upper scapula but there does not appear to be a corresponding lesion on the recent chest CT. No suspicious lesions involving either femur or humerus.  IMPRESSION: The known lytic lesion in the left iliac bone is not well demonstrated on this examination. There are no other definite lytic bone lesions.  Multiple right lung lesions.  Degenerative disease in the cervical and thoracic spine.   Electronically Signed   By: Markus Daft M.D.   On:  12/09/2013 15:34   Mm Digital Screening Bilateral  12/02/2013   CLINICAL DATA:  Screening.  EXAM: DIGITAL SCREENING BILATERAL MAMMOGRAM WITH CAD  COMPARISON:  Previous exam(s).  ACR Breast Density Category c: The breast tissue is heterogeneously dense, which may obscure small masses.  FINDINGS: There are no findings suspicious for malignancy. Images were processed with CAD.  IMPRESSION: No mammographic evidence of malignancy. A result letter of this screening mammogram will be mailed directly to the patient.  RECOMMENDATION: Screening mammogram in one year. (Code:[id number]-B-01Y)  BI-RADS CATEGORY  1: Negative.   Electronically Signed   By: Marin Olp M.D.   On: 12/02/2013 14:10  Results for orders placed during the hospital encounter of 12/09/13  CBC      Result Value Ref Range   WBC 10.0  4.0 - 10.5 K/uL   RBC 4.48  3.87 - 5.11 MIL/uL   Hemoglobin 9.7 (*) 12.0 - 15.0 g/dL   HCT 31.9 (*) 36.0 - 46.0 %   MCV 71.2 (*) 78.0 - 100.0 fL   MCH 21.7 (*) 26.0 - 34.0 pg   MCHC 30.4  30.0 - 36.0 g/dL   RDW 22.5 (*) 11.5 - 15.5 %   Platelets 381  150 - 400 K/uL  COMPREHENSIVE METABOLIC PANEL      Result Value Ref Range   Sodium 136 (*) 137 - 147 mEq/L   Potassium 3.8  3.7 - 5.3 mEq/L   Chloride 97  96 - 112 mEq/L   CO2 27  19 - 32 mEq/L   Glucose, Bld 96  70 - 99 mg/dL   BUN 7  6 - 23 mg/dL   Creatinine, Ser 0.64  0.50 - 1.10 mg/dL   Calcium 9.6  8.4 - 10.5 mg/dL   Total Protein 7.5  6.0 - 8.3 g/dL   Albumin 2.6 (*) 3.5 - 5.2 g/dL   AST 10  0 - 37 U/L   ALT 10  0 - 35 U/L   Alkaline Phosphatase 158 (*) 39 - 117 U/L  Total Bilirubin 0.6  0.3 - 1.2 mg/dL   GFR calc non Af Amer 85 (*) >90 mL/min   GFR calc Af Amer >90  >90 mL/min  HEMOGLOBIN A1C      Result Value Ref Range   Hemoglobin A1C 6.9 (*) <5.7 %   Mean Plasma Glucose 151 (*) <117 mg/dL  CBC      Result Value Ref Range   WBC 10.1  4.0 - 10.5 K/uL   RBC 4.44  3.87 - 5.11 MIL/uL   Hemoglobin 9.5 (*) 12.0 - 15.0 g/dL   HCT 31.6 (*)  36.0 - 46.0 %   MCV 71.2 (*) 78.0 - 100.0 fL   MCH 21.4 (*) 26.0 - 34.0 pg   MCHC 30.1  30.0 - 36.0 g/dL   RDW 22.8 (*) 11.5 - 15.5 %   Platelets 473 (*) 150 - 400 K/uL  COMPREHENSIVE METABOLIC PANEL      Result Value Ref Range   Sodium 138  137 - 147 mEq/L   Potassium 3.8  3.7 - 5.3 mEq/L   Chloride 99  96 - 112 mEq/L   CO2 28  19 - 32 mEq/L   Glucose, Bld 142 (*) 70 - 99 mg/dL   BUN 5 (*) 6 - 23 mg/dL   Creatinine, Ser 0.69  0.50 - 1.10 mg/dL   Calcium 9.1  8.4 - 10.5 mg/dL   Total Protein 6.7  6.0 - 8.3 g/dL   Albumin 2.4 (*) 3.5 - 5.2 g/dL   AST 11  0 - 37 U/L   ALT 9  0 - 35 U/L   Alkaline Phosphatase 149 (*) 39 - 117 U/L   Total Bilirubin 0.9  0.3 - 1.2 mg/dL   GFR calc non Af Amer 83 (*) >90 mL/min   GFR calc Af Amer >90  >90 mL/min  PROTIME-INR      Result Value Ref Range   Prothrombin Time 16.0 (*) 11.6 - 15.2 seconds   INR 1.31  0.00 - 1.49  GLUCOSE, CAPILLARY      Result Value Ref Range   Glucose-Capillary 126 (*) 70 - 99 mg/dL  GLUCOSE, CAPILLARY      Result Value Ref Range   Glucose-Capillary 184 (*) 70 - 99 mg/dL  GLUCOSE, CAPILLARY      Result Value Ref Range   Glucose-Capillary 149 (*) 70 - 99 mg/dL  OCCULT BLOOD, POC DEVICE      Result Value Ref Range   Fecal Occult Bld NEGATIVE  NEGATIVE  TYPE AND SCREEN      Result Value Ref Range   ABO/RH(D) O POS     Antibody Screen NEG     Sample Expiration 12/12/2013     DAT, IgG NEG     Unit Number [id number]     Blood Component Type RBC LR PHER1     Unit division 00     Status of Unit ALLOCATED     Transfusion Status OK TO TRANSFUSE     Crossmatch Result COMPATIBLE     Unit Number [id number]     Blood Component Type RED CELLS,LR     Unit division 00     Status of Unit ALLOCATED     Transfusion Status OK TO TRANSFUSE     Crossmatch Result COMPATIBLE     A/P: Pt with hx of hemoptysis, anemia, wt loss,  renal masses,  lung lesions with assoc lymphadenopathy and left iliac bone lytic lesion  worrisome for metastatic RCC. Plan is for  CT guided biopsy of the left iliac bone lesion today. Details/risks of procedure d/w pt/daughter with their understanding and consent.

## 2013-12-10 NOTE — Progress Notes (Signed)
IP PROGRESS NOTE  Subjective:   She reports a small amount of blood with coughing. No other complaint.  Objective: Vital signs in last 24 hours: Blood pressure 156/77, pulse 104, temperature 98.2 F (36.8 C), temperature source Oral, resp. rate 32, height $RemoveBe'5\' 3"'VpnEtFiXX$  (1.6 m), weight 130 lb 8.2 oz (59.2 kg), SpO2 91.00%.  Intake/Output from previous day: 05/04 0701 - 05/05 0700 In: 1415 [P.O.:480; I.V.:835; IV Piggyback:100] Out: 2 [Urine:2]  Physical Exam:  She appears comfortable. Lungs: Clear bilaterally Cardiac: Regular in rhythm,? S4 gallop Abdomen: Soft and nontender, no hepatomegaly Extremities: No leg edema    Lab Results:  Recent Labs  12/09/13 1025 12/10/13 0346  WBC 10.0 10.1  HGB 9.7* 9.5*  HCT 31.9* 31.6*  PLT 381 473*    BMET  Recent Labs  12/09/13 1025 12/10/13 0346  NA 136* 138  K 3.8 3.8  CL 97 99  CO2 27 28  GLUCOSE 96 142*  BUN 7 5*  CREATININE 0.64 0.69  CALCIUM 9.6 9.1    Studies/Results: Dg Chest 2 View  12/09/2013   CLINICAL DATA:  Hemoptysis.  EXAM: CHEST  2 VIEW  COMPARISON:  None.  FINDINGS: Multiple pulmonary nodules and masses are identified. The largest lesion is in the posterior right lower lobe measuring 4.3 cm in diameter. No consolidative process or pneumothorax is seen. No pleural effusion is identified. No focal bony abnormality  IMPRESSION: Innumerable pulmonary nodules and masses consistent with neoplastic process.   Electronically Signed   By: Inge Rise M.D.   On: 12/09/2013 11:43   Ct Chest W Contrast  12/09/2013   CLINICAL DATA:  Chronic anemia. Pulmonary masses and nodules by chest film.  EXAM: CT CHEST, ABDOMEN, AND PELVIS WITH CONTRAST  TECHNIQUE: Multidetector CT imaging of the chest, abdomen and pelvis was performed following the standard protocol during bolus administration of intravenous contrast.  CONTRAST:  50 mL OMNIPAQUE IOHEXOL 300 MG/ML SOLN, 100 mL OMNIPAQUE IOHEXOL 300 MG/ML SOLN  COMPARISON:  PA and  lateral chest 12/09/2013.  FINDINGS: CT CHEST FINDINGS  Enlarged precarinal lymph node measures 2.2 x 2.6 cm in diameter on image 21. A right hilar lymph node on image twenty-six measures 1.1 x 1.7 cm. Trace bilateral pleural effusions are present.  The lungs demonstrate innumerable bilateral pulmonary nodules and masses. The largest lesion is in the posterior aspect of the right lower lobe and measures 3.9 by 4.1 cm on image 41. Index lesion in the left upper lobe measures 0.7 cm in diameter on image 18. A 2.1 by 2.2 cm diameter lesion the medial aspect of the right lower lobe appears immediately contiguous with the esophagus. Finally, an index lesion in the anterior aspect of the right upper lobe measures 1.9 by 1.8 cm on image 26. The lungs are otherwise unremarkable. No lytic or sclerotic bony lesion is identified.  CT ABDOMEN AND PELVIS FINDINGS  There is a necrotic mass in the left kidney measuring 8.4 cm transverse by 10.0 cm AP on image 75 of the axial series by 9.1 cm craniocaudal on image 50 of the coronal images. No thrombus is identified in the left renal vein. The patient also has a solid tumor in the left kidney worrisome for carcinoma. This lesion measures 2.1 cm transverse by 2.2 cm AP on image 13 of the axial series by 1.6 cm craniocaudal on image 58 of the coronal series. No thrombus is seen in the left renal vein. A 0.8 cm in diameter hyper attenuating lesion posterior  to the right psoas muscle on image 75 is compatible with tumor implant.  A 1.3 cm in diameter hypo attenuating lesion in the posterior right hepatic lobe has density measurements of 1.3 and is most consistent with a cyst. The liver is otherwise unremarkable. The gallbladder, spleen, adrenal glands and pancreas appear normal. The patient is status post hysterectomy. The ovaries are unremarkable. The urinary bladder appears normal. The stomach and small and large bowel appear normal. No lymphadenopathy or fluid collection is  identified.  Image bones demonstrate a lytic lesion in the anterior left iliac wing measuring 2.4 x 1.9 cm on image 82. No other lytic lesion is identified. Advanced left hip osteoarthritis is noted.  IMPRESSION: 10 cm mass in the right kidney is consistent with renal cell carcinoma. Innumerable pulmonary nodules and masses, right hilar and precarinal lymphadenopathy and a lytic lesion the anterior left iliac wing are consistent with metastatic disease. Note is again made that a lesion in the medial aspect of the right lower lobe appears immediately contiguous with the esophagus. 0.8 cm enhancing lesion posterior to the right psoas muscle is compatible with tumor implant.  2.2 cm in diameter mass lesion in the upper pole of the left kidney is highly worrisome for renal cell carcinoma.  1.3 cm in diameter hypo attenuating lesion the liver and 0.9 cm hypoattenuating lesion in the spleen are likely cysts.  Findings were called to Dr. Malachy Moan at the time of interpretation.   Electronically Signed   By: Inge Rise M.D.   On: 12/09/2013 15:25   Ct Abdomen Pelvis W Contrast  12/09/2013   CLINICAL DATA:  Chronic anemia. Pulmonary masses and nodules by chest film.  EXAM: CT CHEST, ABDOMEN, AND PELVIS WITH CONTRAST  TECHNIQUE: Multidetector CT imaging of the chest, abdomen and pelvis was performed following the standard protocol during bolus administration of intravenous contrast.  CONTRAST:  50 mL OMNIPAQUE IOHEXOL 300 MG/ML SOLN, 100 mL OMNIPAQUE IOHEXOL 300 MG/ML SOLN  COMPARISON:  PA and lateral chest 12/09/2013.  FINDINGS: CT CHEST FINDINGS  Enlarged precarinal lymph node measures 2.2 x 2.6 cm in diameter on image 21. A right hilar lymph node on image twenty-six measures 1.1 x 1.7 cm. Trace bilateral pleural effusions are present.  The lungs demonstrate innumerable bilateral pulmonary nodules and masses. The largest lesion is in the posterior aspect of the right lower lobe and measures 3.9 by 4.1 cm on  image 41. Index lesion in the left upper lobe measures 0.7 cm in diameter on image 18. A 2.1 by 2.2 cm diameter lesion the medial aspect of the right lower lobe appears immediately contiguous with the esophagus. Finally, an index lesion in the anterior aspect of the right upper lobe measures 1.9 by 1.8 cm on image 26. The lungs are otherwise unremarkable. No lytic or sclerotic bony lesion is identified.  CT ABDOMEN AND PELVIS FINDINGS  There is a necrotic mass in the left kidney measuring 8.4 cm transverse by 10.0 cm AP on image 75 of the axial series by 9.1 cm craniocaudal on image 50 of the coronal images. No thrombus is identified in the left renal vein. The patient also has a solid tumor in the left kidney worrisome for carcinoma. This lesion measures 2.1 cm transverse by 2.2 cm AP on image 13 of the axial series by 1.6 cm craniocaudal on image 58 of the coronal series. No thrombus is seen in the left renal vein. A 0.8 cm in diameter hyper attenuating lesion posterior  to the right psoas muscle on image 75 is compatible with tumor implant.  A 1.3 cm in diameter hypo attenuating lesion in the posterior right hepatic lobe has density measurements of 1.3 and is most consistent with a cyst. The liver is otherwise unremarkable. The gallbladder, spleen, adrenal glands and pancreas appear normal. The patient is status post hysterectomy. The ovaries are unremarkable. The urinary bladder appears normal. The stomach and small and large bowel appear normal. No lymphadenopathy or fluid collection is identified.  Image bones demonstrate a lytic lesion in the anterior left iliac wing measuring 2.4 x 1.9 cm on image 82. No other lytic lesion is identified. Advanced left hip osteoarthritis is noted.  IMPRESSION: 10 cm mass in the right kidney is consistent with renal cell carcinoma. Innumerable pulmonary nodules and masses, right hilar and precarinal lymphadenopathy and a lytic lesion the anterior left iliac wing are consistent  with metastatic disease. Note is again made that a lesion in the medial aspect of the right lower lobe appears immediately contiguous with the esophagus. 0.8 cm enhancing lesion posterior to the right psoas muscle is compatible with tumor implant.  2.2 cm in diameter mass lesion in the upper pole of the left kidney is highly worrisome for renal cell carcinoma.  1.3 cm in diameter hypo attenuating lesion the liver and 0.9 cm hypoattenuating lesion in the spleen are likely cysts.  Findings were called to Dr. Malachy Moan at the time of interpretation.   Electronically Signed   By: Inge Rise M.D.   On: 12/09/2013 15:25   Ct Biopsy  12/10/2013   CLINICAL DATA:  10 cm right renal mass with multiple pulmonary metastases and destructive lesion of the left iliac bone.  EXAM: CT GUIDED CORE BIOPSY OF LEFT ILIAC BONE  ANESTHESIA/SEDATION: 2.0  Mg IV Versed; 50 mcg IV Fentanyl  Total Moderate Sedation Time: 10 minutes.  PROCEDURE: The procedure risks, benefits, and alternatives were explained to the patient. Questions regarding the procedure were encouraged and answered. The patient understands and consents to the procedure.  The left gluteal region was prepped with Betadine in a sterile fashion, and a sterile drape was applied covering the operative field. A sterile gown and sterile gloves were used for the procedure. Local anesthesia was provided with 1% Lidocaine.  CT was performed of the pelvis in a prone position. After localizing site for biopsy, a 17 gauge trocar needle was advanced to the level of the left iliac bone. After confirming needle tip position, a total of 3 separate 18 gauge core biopsy samples were obtained. Material was submitted in formalin. The outer needle was removed and additional CT performed.  Complications: None  FINDINGS: The expansile and destructive lesion of the left lateral iliac bone was localized and measures approximately 2.8 cm in greatest diameter. Solid tissue was obtained from  the lesion. There were no immediate complications.  IMPRESSION: CT-guided core biopsy performed of the left lateral iliac bone lesion.   Electronically Signed   By: Aletta Edouard M.D.   On: 12/10/2013 12:14   Dg Bone Survey Met  12/09/2013   CLINICAL DATA:  Lytic bone lesion in the pelvis.  EXAM: METASTATIC BONE SURVEY  COMPARISON:  Biopsy 12/02/2013 and chest radiograph 12/09/2013. CT of the chest, abdomen and pelvis on 12/09/2013  FINDINGS: No suspicious lucent lesions in the skull. Degenerative facet disease in the cervical spine. Multilevel degenerative disease in the cervical spine, particularly at C4-C6. The prevertebral soft tissues are normal. Thoracic spine images  demonstrate large nodular lesions in the right chest. Largest lesion is at the right lung base and measures roughly 4.6 cm. No acute bone abnormality in the thoracic or lumbar spine.  Patient has a known lucent lesion involving the left iliac wing but this is not well visualized on the radiographs. No other suspicious lesions in the pelvis. Joint space narrowing in the left hip with mild protrusio deformity. There is a focal lucent area involving the right upper scapula but there does not appear to be a corresponding lesion on the recent chest CT. No suspicious lesions involving either femur or humerus.  IMPRESSION: The known lytic lesion in the left iliac bone is not well demonstrated on this examination. There are no other definite lytic bone lesions.  Multiple right lung lesions.  Degenerative disease in the cervical and thoracic spine.   Electronically Signed   By: Markus Daft M.D.   On: 12/09/2013 15:34    Medications: I have reviewed the patient's current medications.  Assessment/Plan:  1. Dominant right renal mass, left renal mass, lung and left iliac metastases-status post a CT-guided biopsy of the left iliac bone lesion today  2. Chronic anemia-most likely secondary to iron deficiency and chronic GU blood loss  3. Small  volume hemoptysis-likely related to lung masses  Her performance status appears improved today. She does not appear to have respiratory distress. I discussed the probable diagnosis of metastatic renal cell carcinoma and treatment options with Angelica Murphy and her family. We again reviewed the 12/09/2013 CT scans. I recommend treatment with pazopanib if a diagnosis of metastatic renal cell carcinoma is confirmed.  Recommendations: 1. Discharge per medical service 2. Outpatient followup at the [facility] 12/13/2013   LOS: 1 day   Ladell Pier  12/10/2013, 1:52 PM

## 2013-12-10 NOTE — Evaluation (Addendum)
Occupational Therapy One Time Evaluation Patient Details Name: Angelica Murphy MRN: [id number] DOB: May 03, 1936 Today's Date: 12/10/2013    History of Present Illness Patient is a 78 year old with history of hypertension, hyperlipidemia, diabetes mellitus who was recently seen by Dr. Beryle Beams for anemia presented to the ER with 2 episodes of hemoptysis. CT guided biopsy shows left iliac bone lesion in pt with hx of hemoptysis, anemia,  renal masses, and lung lesions   Clinical Impression   Pt overall at supervision level and min guard for tub transfer. Husband at home with pt and can assist PRN. Daughter present in room and pt and daughter both express no concerns regarding ADL and daughter has been providing stand by assist for pt to get up to [facility]. Pt with no further concerns regarding ADL. Will sign off for acute OT.    Follow Up Recommendations  No OT follow up;Supervision - Intermittent    Equipment Recommendations  None recommended by OT    Recommendations for Other Services       Precautions / Restrictions Precautions Precautions: None      Mobility Bed Mobility Overal bed mobility: Modified Independent                Transfers Overall transfer level: Needs assistance Equipment used: None Transfers: Sit to/from Stand Sit to Stand: Supervision              Balance                                            ADL Overall ADL's : Needs assistance/impaired Eating/Feeding: Independent;Sitting   Grooming: Wash/dry hands;Supervision/safety;Standing   Upper Body Bathing: Set up;Sitting   Lower Body Bathing: Supervison/ safety;Sit to/from stand   Upper Body Dressing : Set up;Sitting   Lower Body Dressing: Supervision/safety;Sit to/from stand   Toilet Transfer: Supervision/safety;Ambulation;Comfort height toilet   Toileting- Clothing Manipulation and Hygiene: Supervision/safety;Sit to/from stand   Tub/ Shower Transfer: Min  guard;Tub transfer     General ADL Comments: Feel pt is very close to baseline with ADL. Up in room for functional transfers and ADL with supervision/min guard assist level. She reached into cabinets with no LOB. Has been up with daughter to [facility]. Pt able to transfer into bathroom with supervision and simulate step over tub with min guard assist. Advised pt to have initial assist with showering especially if she wants to get down into bottom of tub and she states husband can help and has helped in the past also. No further OT needs.      Vision                     Perception     Praxis      Pertinent Vitals/Pain No complaint of.     Hand Dominance     Extremity/Trunk Assessment Upper Extremity Assessment Upper Extremity Assessment: Overall WFL for tasks assessed           Communication Communication Communication: No difficulties   Cognition Arousal/Alertness: Awake/alert Behavior During Therapy: WFL for tasks assessed/performed Overall Cognitive Status: Within Functional Limits for tasks assessed                     General Comments       Exercises       Shoulder Instructions      Home Living Family/patient  expects to be discharged to:: Private residence Living Arrangements: Spouse/significant other Available Help at Discharge: Family Type of Home: House Home Access: Stairs to enter Technical brewer of Steps: 3   [place]: One level     Bathroom Shower/Tub: Teacher, early years/pre: Handicapped height     Home Equipment: Financial controller: Reacher        Prior Functioning/Environment Level of Independence: Independent             OT Diagnosis:     OT Problem List:     OT Treatment/Interventions:      OT Goals(Current goals can be found in the care plan section) Acute Rehab OT Goals Patient Stated Goal: home  OT Frequency:     Barriers to D/C:            Co-evaluation               End of Session    Activity Tolerance: Patient tolerated treatment well Patient left: in bed;with call bell/phone within reach;with family/visitor present   Time: 1237-1300 OT Time Calculation (min): 23 min Charges:  OT General Charges $OT Visit: 1 Procedure OT Evaluation $Initial OT Evaluation Tier I: 1 Procedure OT Treatments $Therapeutic Activity: 8-22 mins G-Codes:    Alycia Patten Samone Guhl [phone] 12/10/2013, 1:10 PM

## 2013-12-10 NOTE — Progress Notes (Signed)
INITIAL NUTRITION ASSESSMENT  DOCUMENTATION CODES Per approved criteria  -Not Applicable   INTERVENTION: -Recommend Glucerna shake once daily -Recommend 2PM and 8PM nourishments -Encouraged intake of high protein/kcal foods -Will continue to monitor  NUTRITION DIAGNOSIS: Unintentional wt loss related to inadequate oral intake/chronic disease as evidenced by 5lbs wt loss in one month, probable renal cancer dx.   Goal: Pt to meet >/= 90% of their estimated nutrition needs    Monitor:  Total protein/energy intake, labs, weights  Reason for Assessment: MST  78 y.o. female  Admitting Dx: Hemoptysis  ASSESSMENT: Patient is a 78 year old with history of hypertension, hyperlipidemia, diabetes mellitus who was recently seen by Dr. Beryle Beams for anemia presented to the ER with 2 episodes of hemoptysis. History was obtained from the patient who reported that her first episode of hemoptysis happened on Wednesday last week, 5 days ago associated with cough and pleuritic chest pain. Subsequently she did not have any further episodes until today when she had repeat hemoptysis about one fourth of spoon in thick sputum. She has noticed a loss of appetite and about "couple of pounds" weight loss in the last 1 month  -Pt endorsed an unintentional wt loss of 5 lbs, or 3.8% body weight loss, in past one month -Noted taste changes and lack of interest in foods that have contributed to wt loss -Denied significant nausea; however did experience 2 episodes of hemoptysis in one week that likely contributed to decreased intake -Diet recall indicates pt consuming 2-3 small meals per day. Breakfast usually cold or warm cereals, sometimes skips lunch or will eat sandwich or soup, and various chicken/meat/vegetables dishes for dinner. -Does not usually snack during meals; however has been enjoying "crunchy" foods (chips/chex mix) as they have the most taste -MD noted pt with probable renal cancer dx. Biopsy  pending. Discussed adding snacks and implementing Glucerna shakes once daily to promote weight maintenance -Reviewed nutrition therapy for taste changes-implementing spicy, citruses, or acidic foods to promote stronger taste -Discussed importance of protein/kcal foods -Pt with good appetite currently, eating 90% of meals w/out nausea/abd pain post meal    Height: Ht Readings from Last 1 Encounters:  12/09/13 5\' 3"  (1.6 m)    Weight: Wt Readings from Last 1 Encounters:  12/09/13 130 lb 8.2 oz (59.2 kg)    Ideal Body Weight: 115 lbs  % Ideal Body Weight: 113%  Wt Readings from Last 10 Encounters:  12/09/13 130 lb 8.2 oz (59.2 kg)  11/26/13 131 lb (59.421 kg)  11/18/13 132 lb 4.8 oz (60.011 kg)  08/30/13 136 lb (61.689 kg)  05/20/13 136 lb 8 oz (61.916 kg)  02/26/13 137 lb (62.143 kg)  11/26/12 134 lb 6.4 oz (60.963 kg)  08/21/12 137 lb (62.143 kg)  06/25/12 140 lb (63.504 kg)  06/21/12 139 lb 3.2 oz (63.141 kg)    Usual Body Weight: 135 lbs  % Usual Body Weight: 96%  BMI:  Body mass index is 23.13 kg/(m^2).  Estimated Nutritional Needs: Kcal: 8416-6063 Protein: 70-85 gram Fluid: >/=1800 ml/daily  Skin: WDL  Diet Order: Carb Control  EDUCATION NEEDS: -Education needs addressed   Intake/Output Summary (Last 24 hours) at 12/10/13 1333 Last data filed at 12/10/13 0700  Gross per 24 hour  Intake   1415 ml  Output      2 ml  Net   1413 ml    Last BM: 5/04   Labs:   Recent Labs Lab 12/09/13 1025 12/10/13 0346  NA 136* 138  K  3.8 3.8  CL 97 99  CO2 27 28  BUN 7 5*  CREATININE 0.64 0.69  CALCIUM 9.6 9.1  GLUCOSE 96 142*    CBG (last 3)   Recent Labs  12/09/13 2149 12/10/13 0758 12/10/13 1225  GLUCAP 184* 149* 125*    Scheduled Meds: . benzonatate  100 mg Oral TID  . fentaNYL      . insulin aspart  0-5 Units Subcutaneous QHS  . insulin aspart  0-9 Units Subcutaneous TID WC  . insulin glargine  10 Units Subcutaneous QHS  .  levalbuterol  0.63 mg Nebulization Once  . midazolam      . nebivolol  10 mg Oral Daily  . pantoprazole  40 mg Oral Daily  . piperacillin-tazobactam (ZOSYN)  IV  3.375 g Intravenous Q8H    Continuous Infusions: . sodium chloride 75 mL/hr at 12/09/13 1952    Past Medical History  Diagnosis Date  . Allergic rhinitis   . Diabetes mellitus     Type II  . Diverticulosis of colon   . GERD (gastroesophageal reflux disease)   . Hyperlipidemia   . Hypertension   . Hx of colonic polyps   . Gout   . Hemorrhoids   . Family history of colon cancer father  . Anemia   . Tachycardia, paroxysmal 09-2010    Past Surgical History  Procedure Laterality Date  . Abdominal hysterectomy  1972  . Cesarean section    . Cataract extraction, bilateral  February and March, 2014  . Breast lumpectomy  02/07    Left  ([place])  Benign  . Breast biopsy  02/08    left- Benign     Atlee Abide MS RD LDN Clinical Dietitian [phone]

## 2013-12-10 NOTE — Discharge Summary (Signed)
Physician Discharge Summary  Angelica Murphy GEX:[id number] DOB: 1936-03-14 DOA: 12/09/2013  PCP: Viviana Simpler, MD  Admit date: 12/09/2013 Discharge date: 12/10/2013  Time spent: 30 minutes  Recommendations for Outpatient Follow-up:  1. Follow up with oncology as recommended 2. Follow up with CBC in 2 weeks.   Discharge Diagnoses:  Principal Problem:   Hemoptysis Active Problems:   DIABETES MELLITUS, TYPE II   HYPERTENSION   GERD   UNSPECIFIED PAROXYSMAL TACHYCARDIA   Acute respiratory failure   Renal cell carcinoma   Discharge Condition: stable  Diet recommendation: regular   Filed Weights   12/09/13 1724  Weight: 59.2 kg (130 lb 8.2 oz)    History of present illness:  Patient is a 78 year old with history of hypertension, hyperlipidemia, diabetes mellitus who was recently seen by Dr. Beryle Beams for anemia presented to the ER with 2 episodes of hemoptysis. History was obtained from the patient who reported that her first episode of hemoptysis happened on Wednesday last week, 5 days ago associated with cough and pleuritic chest pain. Subsequently she did not have any further episodes until today when she had repeat hemoptysis about one fourth of spoon in thick sputum. She has noticed a loss of appetite and about "couple of pounds" weight loss in the last 1 month.  Chest x-ray showed multiple pulmonary nodules and masses, no consolidative process or pneumothorax or any focal bony abnormality. CT of the chest abdomen and pelvis showed 10 cm mass in the right kidney consistent with renal cell carcinoma, innumerable pulmonary nodules and masses, right hilar and precarinal lymphadenopathy, lytic lesion in the anterior left iliac wing consistent with metastasis.  Oncology was consulted and patient was seen by Dr Learta Codding and tissue biopsy was recommended. However patient was noted to be tachycardiac, short of breath, satting 92% on 2 L O2 and hence oncology recommended admission  overnight for biopsy in a.m.   Hospital Course:  Acute hypoxic respiratory failure with Hemoptysis: Possibly due to lung metastasis from possible renal cell carcinoma, also possibility of aspiration pneumonitis  She was admitted to telemetry and started on zosyn, but later on discharged on augmentin.  - No pulmonary embolism on the CT CHEST   Renal cell carcinoma : With lung masses, lytic left iliac bone lesions suspicious for metastasis, new diagnosis  - Oncology consulted, consulted interventional radiology for CT-guided biopsy . She underwent biopsy of the iliac bone.   Anemia:  - Chronic issue, patient was following hematology/oncology, patient had bone marrow biopsy on 4/27 which showed no major findings suggestive of myelodysplastic syndrome.  - ANEMIA probably secondary to renal cell carcinoma.  DIABETES MELLITUS, TYPE II  - resume lantus and SSI.  HYPERTENSION  - Placed on beta blocker, however hold ACE inhibitor due to recent cough issues      Procedures:  Ct guided biopsy.  Consultations:  Oncology   Intervention radiology  Discharge Exam: Filed Vitals:   12/10/13 1415  BP: 148/73  Pulse: 92  Temp: 98.1 F (36.7 C)  Resp: 20    General: alert afebrile comfortable Cardiovascular: s1s2 Respiratory: ctab  Discharge Instructions You were cared for by a hospitalist during your hospital stay. If you have any questions about your discharge medications or the care you received while you were in the hospital after you are discharged, you can call the unit and asked to speak with the hospitalist on call if the hospitalist that took care of you is not available. Once you are discharged, your primary care  physician will handle any further medical issues. Please note that NO REFILLS for any discharge medications will be authorized once you are discharged, as it is imperative that you return to your primary care physician (or establish a relationship with a primary care  physician if you do not have one) for your aftercare needs so that they can reassess your need for medications and monitor your lab values.  Discharge Orders   Future Appointments Provider Department Dept Phone   12/13/2013 1:15 PM [facility] [phone]   12/13/2013 1:45 PM Owens Shark, NP [facility] [phone]   12/16/2013 11:15 AM Annia Belt, MD [place] [phone]   02/26/2014 10:30 AM Venia Carbon, MD [place] [facility] [place]   Future Orders Complete By Expires   Diet - low sodium heart healthy  As directed    Discharge instructions  As directed        Medication List         ARTHRITIS PAIN RELIEF 650 MG CR tablet  Generic drug:  acetaminophen  Take 650 mg by mouth every 8 (eight) hours as needed for pain.     benzonatate 100 MG capsule  Commonly known as:  TESSALON  Take 1 capsule (100 mg total) by mouth 3 (three) times daily.     feeding supplement (GLUCERNA SHAKE) Liqd  Take 237 mLs by mouth daily.  Start taking on:  12/11/2013     HYDROcodone-acetaminophen 5-325 MG per tablet  Commonly known as:  NORCO/VICODIN  Take 1 tablet by mouth every 6 (six) hours as needed for moderate pain.     insulin glargine 100 UNIT/ML injection  Commonly known as:  LANTUS  Inject 15 Units into the skin at bedtime.     loratadine 10 MG tablet  Commonly known as:  CLARITIN  Take 10 mg by mouth daily.     metFORMIN 1000 MG tablet  Commonly known as:  GLUCOPHAGE  Take 1,000 mg by mouth 2 (two) times daily with a meal.     nebivolol 5 MG tablet  Commonly known as:  BYSTOLIC  Take 10 mg by mouth every morning.     pantoprazole 40 MG tablet  Commonly known as:  PROTONIX  Take 40 mg by mouth daily.     ramipril 10 MG capsule  Commonly known as:  ALTACE  Take 10 mg by mouth every morning.     simvastatin 20 MG tablet  Commonly known as:   ZOCOR  Take 20 mg by mouth every evening.       Allergies  Allergen Reactions  . Sulfonamide Derivatives     REACTION: eyes red and blurry       Follow-up Information   Follow up with Viviana Simpler, MD. Schedule an appointment as soon as possible for a visit in 1 week.   Specialties:  Internal Medicine, Pediatrics   Contact information:   [place] [address] [phone]       Follow up with Betsy Coder, MD On 12/13/2013. (at 1 :15 pm.)    Specialty:  Oncology   Contact information:   [place] [address] [phone]        The results of significant diagnostics from this hospitalization (including imaging, microbiology, ancillary and laboratory) are listed below for reference.    Significant Diagnostic Studies: Dg Chest 2 View  12/09/2013  CLINICAL DATA:  Hemoptysis.  EXAM: CHEST  2 VIEW  COMPARISON:  None.  FINDINGS: Multiple pulmonary nodules and masses are identified. The largest lesion is in the posterior right lower lobe measuring 4.3 cm in diameter. No consolidative process or pneumothorax is seen. No pleural effusion is identified. No focal bony abnormality  IMPRESSION: Innumerable pulmonary nodules and masses consistent with neoplastic process.   Electronically Signed   By: Inge Rise M.D.   On: 12/09/2013 11:43   Ct Chest W Contrast  12/09/2013   CLINICAL DATA:  Chronic anemia. Pulmonary masses and nodules by chest film.  EXAM: CT CHEST, ABDOMEN, AND PELVIS WITH CONTRAST  TECHNIQUE: Multidetector CT imaging of the chest, abdomen and pelvis was performed following the standard protocol during bolus administration of intravenous contrast.  CONTRAST:  50 mL OMNIPAQUE IOHEXOL 300 MG/ML SOLN, 100 mL OMNIPAQUE IOHEXOL 300 MG/ML SOLN  COMPARISON:  PA and lateral chest 12/09/2013.  FINDINGS: CT CHEST FINDINGS  Enlarged precarinal lymph node measures 2.2 x 2.6 cm in diameter on image 21. A right hilar lymph node on image  twenty-six measures 1.1 x 1.7 cm. Trace bilateral pleural effusions are present.  The lungs demonstrate innumerable bilateral pulmonary nodules and masses. The largest lesion is in the posterior aspect of the right lower lobe and measures 3.9 by 4.1 cm on image 41. Index lesion in the left upper lobe measures 0.7 cm in diameter on image 18. A 2.1 by 2.2 cm diameter lesion the medial aspect of the right lower lobe appears immediately contiguous with the esophagus. Finally, an index lesion in the anterior aspect of the right upper lobe measures 1.9 by 1.8 cm on image 26. The lungs are otherwise unremarkable. No lytic or sclerotic bony lesion is identified.  CT ABDOMEN AND PELVIS FINDINGS  There is a necrotic mass in the left kidney measuring 8.4 cm transverse by 10.0 cm AP on image 75 of the axial series by 9.1 cm craniocaudal on image 50 of the coronal images. No thrombus is identified in the left renal vein. The patient also has a solid tumor in the left kidney worrisome for carcinoma. This lesion measures 2.1 cm transverse by 2.2 cm AP on image 13 of the axial series by 1.6 cm craniocaudal on image 58 of the coronal series. No thrombus is seen in the left renal vein. A 0.8 cm in diameter hyper attenuating lesion posterior to the right psoas muscle on image 75 is compatible with tumor implant.  A 1.3 cm in diameter hypo attenuating lesion in the posterior right hepatic lobe has density measurements of 1.3 and is most consistent with a cyst. The liver is otherwise unremarkable. The gallbladder, spleen, adrenal glands and pancreas appear normal. The patient is status post hysterectomy. The ovaries are unremarkable. The urinary bladder appears normal. The stomach and small and large bowel appear normal. No lymphadenopathy or fluid collection is identified.  Image bones demonstrate a lytic lesion in the anterior left iliac wing measuring 2.4 x 1.9 cm on image 82. No other lytic lesion is identified. Advanced left hip  osteoarthritis is noted.  IMPRESSION: 10 cm mass in the right kidney is consistent with renal cell carcinoma. Innumerable pulmonary nodules and masses, right hilar and precarinal lymphadenopathy and a lytic lesion the anterior left iliac wing are consistent with metastatic disease. Note is again made that a lesion in the medial aspect of the right lower lobe appears immediately contiguous with the esophagus. 0.8 cm enhancing lesion posterior to the  right psoas muscle is compatible with tumor implant.  2.2 cm in diameter mass lesion in the upper pole of the left kidney is highly worrisome for renal cell carcinoma.  1.3 cm in diameter hypo attenuating lesion the liver and 0.9 cm hypoattenuating lesion in the spleen are likely cysts.  Findings were called to Dr. Malachy Moan at the time of interpretation.   Electronically Signed   By: Inge Rise M.D.   On: 12/09/2013 15:25   Ct Abdomen Pelvis W Contrast  12/09/2013   CLINICAL DATA:  Chronic anemia. Pulmonary masses and nodules by chest film.  EXAM: CT CHEST, ABDOMEN, AND PELVIS WITH CONTRAST  TECHNIQUE: Multidetector CT imaging of the chest, abdomen and pelvis was performed following the standard protocol during bolus administration of intravenous contrast.  CONTRAST:  50 mL OMNIPAQUE IOHEXOL 300 MG/ML SOLN, 100 mL OMNIPAQUE IOHEXOL 300 MG/ML SOLN  COMPARISON:  PA and lateral chest 12/09/2013.  FINDINGS: CT CHEST FINDINGS  Enlarged precarinal lymph node measures 2.2 x 2.6 cm in diameter on image 21. A right hilar lymph node on image twenty-six measures 1.1 x 1.7 cm. Trace bilateral pleural effusions are present.  The lungs demonstrate innumerable bilateral pulmonary nodules and masses. The largest lesion is in the posterior aspect of the right lower lobe and measures 3.9 by 4.1 cm on image 41. Index lesion in the left upper lobe measures 0.7 cm in diameter on image 18. A 2.1 by 2.2 cm diameter lesion the medial aspect of the right lower lobe appears  immediately contiguous with the esophagus. Finally, an index lesion in the anterior aspect of the right upper lobe measures 1.9 by 1.8 cm on image 26. The lungs are otherwise unremarkable. No lytic or sclerotic bony lesion is identified.  CT ABDOMEN AND PELVIS FINDINGS  There is a necrotic mass in the left kidney measuring 8.4 cm transverse by 10.0 cm AP on image 75 of the axial series by 9.1 cm craniocaudal on image 50 of the coronal images. No thrombus is identified in the left renal vein. The patient also has a solid tumor in the left kidney worrisome for carcinoma. This lesion measures 2.1 cm transverse by 2.2 cm AP on image 13 of the axial series by 1.6 cm craniocaudal on image 58 of the coronal series. No thrombus is seen in the left renal vein. A 0.8 cm in diameter hyper attenuating lesion posterior to the right psoas muscle on image 75 is compatible with tumor implant.  A 1.3 cm in diameter hypo attenuating lesion in the posterior right hepatic lobe has density measurements of 1.3 and is most consistent with a cyst. The liver is otherwise unremarkable. The gallbladder, spleen, adrenal glands and pancreas appear normal. The patient is status post hysterectomy. The ovaries are unremarkable. The urinary bladder appears normal. The stomach and small and large bowel appear normal. No lymphadenopathy or fluid collection is identified.  Image bones demonstrate a lytic lesion in the anterior left iliac wing measuring 2.4 x 1.9 cm on image 82. No other lytic lesion is identified. Advanced left hip osteoarthritis is noted.  IMPRESSION: 10 cm mass in the right kidney is consistent with renal cell carcinoma. Innumerable pulmonary nodules and masses, right hilar and precarinal lymphadenopathy and a lytic lesion the anterior left iliac wing are consistent with metastatic disease. Note is again made that a lesion in the medial aspect of the right lower lobe appears immediately contiguous with the esophagus. 0.8 cm  enhancing lesion posterior to the  right psoas muscle is compatible with tumor implant.  2.2 cm in diameter mass lesion in the upper pole of the left kidney is highly worrisome for renal cell carcinoma.  1.3 cm in diameter hypo attenuating lesion the liver and 0.9 cm hypoattenuating lesion in the spleen are likely cysts.  Findings were called to Dr. Malachy Moan at the time of interpretation.   Electronically Signed   By: Inge Rise M.D.   On: 12/09/2013 15:25   Ct Biopsy  12/10/2013   CLINICAL DATA:  10 cm right renal mass with multiple pulmonary metastases and destructive lesion of the left iliac bone.  EXAM: CT GUIDED CORE BIOPSY OF LEFT ILIAC BONE  ANESTHESIA/SEDATION: 2.0  Mg IV Versed; 50 mcg IV Fentanyl  Total Moderate Sedation Time: 10 minutes.  PROCEDURE: The procedure risks, benefits, and alternatives were explained to the patient. Questions regarding the procedure were encouraged and answered. The patient understands and consents to the procedure.  The left gluteal region was prepped with Betadine in a sterile fashion, and a sterile drape was applied covering the operative field. A sterile gown and sterile gloves were used for the procedure. Local anesthesia was provided with 1% Lidocaine.  CT was performed of the pelvis in a prone position. After localizing site for biopsy, a 17 gauge trocar needle was advanced to the level of the left iliac bone. After confirming needle tip position, a total of 3 separate 18 gauge core biopsy samples were obtained. Material was submitted in formalin. The outer needle was removed and additional CT performed.  Complications: None  FINDINGS: The expansile and destructive lesion of the left lateral iliac bone was localized and measures approximately 2.8 cm in greatest diameter. Solid tissue was obtained from the lesion. There were no immediate complications.  IMPRESSION: CT-guided core biopsy performed of the left lateral iliac bone lesion.   Electronically Signed    By: Aletta Edouard M.D.   On: 12/10/2013 12:14   Ct Biopsy  12/02/2013   CLINICAL DATA:  Anemia  EXAM: CT-GUIDED BIOPSY BONE MARROW ASPIRATE AND CORE  MEDICATIONS AND MEDICAL HISTORY: Versed 2 mg, Fentanyl 100 mcg.  Additional Medications: None.  ANESTHESIA/SEDATION: Moderate sedation time: 10 minutes  PROCEDURE: The procedure, risks, benefits, and alternatives were explained to the patient. Questions regarding the procedure were encouraged and answered. The patient understands and consents to the procedure.  The back was prepped with Betadine in a sterile fashion, and a sterile drape was applied covering the operative field. A sterile gown and sterile gloves were used for the procedure.  Under CT guidance, an 11 gauge needle was inserted into the right iliac bone via posterior approach. Aspirate and a core were obtained. Final imaging was performed.  Patient tolerated the procedure well without complication. Vital sign monitoring by nursing staff during the procedure will continue as patient is in the special procedures unit for post procedure observation.  FINDINGS: The images document guide needle placement within the right iliac bone via posterior approach. Post biopsy images demonstrate no hemorrhage.  A lytic bone lesion in the left iliac crest is noted.  IMPRESSION: Successful CT-guided bone marrow aspirate and core.  A left iliac lytic bone lesion was identified. Bone scan can be performed to further characterize.   Electronically Signed   By: Maryclare Bean M.D.   On: 12/02/2013 16:29   Dg Bone Survey Met  12/09/2013   CLINICAL DATA:  Lytic bone lesion in the pelvis.  EXAM: METASTATIC BONE SURVEY  COMPARISON:  Biopsy 12/02/2013 and chest radiograph 12/09/2013. CT of the chest, abdomen and pelvis on 12/09/2013  FINDINGS: No suspicious lucent lesions in the skull. Degenerative facet disease in the cervical spine. Multilevel degenerative disease in the cervical spine, particularly at C4-C6. The prevertebral  soft tissues are normal. Thoracic spine images demonstrate large nodular lesions in the right chest. Largest lesion is at the right lung base and measures roughly 4.6 cm. No acute bone abnormality in the thoracic or lumbar spine.  Patient has a known lucent lesion involving the left iliac wing but this is not well visualized on the radiographs. No other suspicious lesions in the pelvis. Joint space narrowing in the left hip with mild protrusio deformity. There is a focal lucent area involving the right upper scapula but there does not appear to be a corresponding lesion on the recent chest CT. No suspicious lesions involving either femur or humerus.  IMPRESSION: The known lytic lesion in the left iliac bone is not well demonstrated on this examination. There are no other definite lytic bone lesions.  Multiple right lung lesions.  Degenerative disease in the cervical and thoracic spine.   Electronically Signed   By: Markus Daft M.D.   On: 12/09/2013 15:34   Mm Digital Screening Bilateral  12/02/2013   CLINICAL DATA:  Screening.  EXAM: DIGITAL SCREENING BILATERAL MAMMOGRAM WITH CAD  COMPARISON:  Previous exam(s).  ACR Breast Density Category c: The breast tissue is heterogeneously dense, which may obscure small masses.  FINDINGS: There are no findings suspicious for malignancy. Images were processed with CAD.  IMPRESSION: No mammographic evidence of malignancy. A result letter of this screening mammogram will be mailed directly to the patient.  RECOMMENDATION: Screening mammogram in one year. (Code:[id number]-B-01Y)  BI-RADS CATEGORY  1: Negative.   Electronically Signed   By: Marin Olp M.D.   On: 12/02/2013 14:10    Microbiology: No results found for this or any previous visit (from the past 240 hour(s)).   Labs: Basic Metabolic Panel:  Recent Labs Lab 12/09/13 1025 12/10/13 0346  NA 136* 138  K 3.8 3.8  CL 97 99  CO2 27 28  GLUCOSE 96 142*  BUN 7 5*  CREATININE 0.64 0.69  CALCIUM 9.6 9.1   Liver  Function Tests:  Recent Labs Lab 12/09/13 1025 12/10/13 0346  AST 10 11  ALT 10 9  ALKPHOS 158* 149*  BILITOT 0.6 0.9  PROT 7.5 6.7  ALBUMIN 2.6* 2.4*   No results found for this basename: LIPASE, AMYLASE,  in the last 168 hours No results found for this basename: AMMONIA,  in the last 168 hours CBC:  Recent Labs Lab 12/09/13 1025 12/10/13 0346  WBC 10.0 10.1  HGB 9.7* 9.5*  HCT 31.9* 31.6*  MCV 71.2* 71.2*  PLT 381 473*   Cardiac Enzymes: No results found for this basename: CKTOTAL, CKMB, CKMBINDEX, TROPONINI,  in the last 168 hours BNP: BNP (last 3 results) No results found for this basename: PROBNP,  in the last 8760 hours CBG:  Recent Labs Lab 12/09/13 1824 12/09/13 2149 12/10/13 0758 12/10/13 1225  GLUCAP 126* 184* 149* 125*       Signed:  Hosie Poisson  [facility] 12/10/2013, 3:27 PM

## 2013-12-13 ENCOUNTER — Telehealth: Payer: Self-pay | Admitting: Oncology

## 2013-12-13 ENCOUNTER — Ambulatory Visit (HOSPITAL_BASED_OUTPATIENT_CLINIC_OR_DEPARTMENT_OTHER): Payer: Medicare Other | Admitting: Nurse Practitioner

## 2013-12-13 ENCOUNTER — Other Ambulatory Visit (HOSPITAL_BASED_OUTPATIENT_CLINIC_OR_DEPARTMENT_OTHER): Payer: Medicare Other

## 2013-12-13 VITALS — BP 162/85 | HR 99 | Temp 99.1°F | Resp 22 | Ht 63.0 in | Wt 132.2 lb

## 2013-12-13 DIAGNOSIS — C649 Malignant neoplasm of unspecified kidney, except renal pelvis: Secondary | ICD-10-CM

## 2013-12-13 DIAGNOSIS — D649 Anemia, unspecified: Secondary | ICD-10-CM

## 2013-12-13 DIAGNOSIS — R911 Solitary pulmonary nodule: Secondary | ICD-10-CM

## 2013-12-13 DIAGNOSIS — R0609 Other forms of dyspnea: Secondary | ICD-10-CM

## 2013-12-13 DIAGNOSIS — R05 Cough: Secondary | ICD-10-CM

## 2013-12-13 DIAGNOSIS — R0989 Other specified symptoms and signs involving the circulatory and respiratory systems: Secondary | ICD-10-CM

## 2013-12-13 DIAGNOSIS — R042 Hemoptysis: Secondary | ICD-10-CM

## 2013-12-13 DIAGNOSIS — C7952 Secondary malignant neoplasm of bone marrow: Secondary | ICD-10-CM

## 2013-12-13 DIAGNOSIS — D509 Iron deficiency anemia, unspecified: Secondary | ICD-10-CM

## 2013-12-13 DIAGNOSIS — C7951 Secondary malignant neoplasm of bone: Secondary | ICD-10-CM

## 2013-12-13 DIAGNOSIS — R059 Cough, unspecified: Secondary | ICD-10-CM

## 2013-12-13 LAB — CBC WITH DIFFERENTIAL/PLATELET
BASO%: 0.3 % (ref 0.0–2.0)
BASOS ABS: 0 10*3/uL (ref 0.0–0.1)
EOS%: 0.8 % (ref 0.0–7.0)
Eosinophils Absolute: 0.1 10*3/uL (ref 0.0–0.5)
HEMATOCRIT: 31 % — AB (ref 34.8–46.6)
HGB: 9.5 g/dL — ABNORMAL LOW (ref 11.6–15.9)
LYMPH%: 9.9 % — ABNORMAL LOW (ref 14.0–49.7)
MCH: 21.3 pg — AB (ref 25.1–34.0)
MCHC: 30.6 g/dL — AB (ref 31.5–36.0)
MCV: 69.6 fL — AB (ref 79.5–101.0)
MONO#: 1 10*3/uL — ABNORMAL HIGH (ref 0.1–0.9)
MONO%: 9.1 % (ref 0.0–14.0)
NEUT#: 9.2 10*3/uL — ABNORMAL HIGH (ref 1.5–6.5)
NEUT%: 79.9 % — AB (ref 38.4–76.8)
Platelets: 459 10*3/uL — ABNORMAL HIGH (ref 145–400)
RBC: 4.46 10*6/uL (ref 3.70–5.45)
RDW: 24.9 % — ABNORMAL HIGH (ref 11.2–14.5)
WBC: 11.5 10*3/uL — ABNORMAL HIGH (ref 3.9–10.3)
lymph#: 1.1 10*3/uL (ref 0.9–3.3)

## 2013-12-13 LAB — TYPE AND SCREEN
ABO/RH(D): O POS
Antibody Screen: NEGATIVE
DAT, IgG: NEGATIVE
UNIT DIVISION: 0
Unit division: 0

## 2013-12-13 LAB — CHCC SMEAR

## 2013-12-13 LAB — FERRITIN CHCC: Ferritin: 558 ng/ml — ABNORMAL HIGH (ref 9–269)

## 2013-12-13 NOTE — Telephone Encounter (Signed)
gv adn printed appt scehd adn avs for pt for May

## 2013-12-13 NOTE — Progress Notes (Addendum)
  Angelica Murphy OFFICE PROGRESS NOTE   Diagnosis:  Metastatic renal cell carcinoma.  INTERVAL HISTORY:   Angelica Murphy was hospitalized on 12/09/2013 presenting with hemoptysis. CT scans showed innumerable lung nodules and masses, right hilar and precarinal lymphadenopathy, lytic lesion anterior left iliac wing, a 10 cm mass in the right kidney, a 2.2 cm mass in the upper pole of the left kidney. 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  She was seen by Dr. Benay Spice during the hospitalization.  She presents today for scheduled followup since discharge.  She has had no further hemoptysis. She notes dyspnea on exertion and a persistent cough. No fever. She denies pain. No nausea or vomiting. Bowels moving regularly overall. She had some loose stools yesterday. Appetite is poor.  Objective:  Vital signs in last 24 hours:  Blood pressure 162/85, pulse 99, temperature 99.1 F (37.3 C), temperature source Oral, resp. rate 22, height 5\' 3"  (1.6 m), weight 132 lb 3.2 oz (59.966 kg), SpO2 94.00%.    HEENT: No thrush or ulcerations. Resp: Lungs clear. Cardio: Regular cardiac rhythm. GI: Soft and nontender. No hepatomegaly Vascular: No leg edema.   Lab Results:  Lab Results  Component Value Date   WBC 11.5* 12/13/2013   HGB 9.5* 12/13/2013   HCT 31.0* 12/13/2013   MCV 69.6* 12/13/2013   PLT 459* 12/13/2013   NEUTROABS 9.2* 12/13/2013    Imaging:  No results found.  Medications: I have reviewed the patient's current medications.  Assessment/Plan: 1. Metastatic renal cell carcinoma presenting with hemoptysis on 12/09/2013.   CT scans showed innumerable lung nodules and masses, right hilar and precarinal lymphadenopathy, lytic lesion anterior left iliac wing, 10 cm mass in the right kidney, 2.2 cm mass in the upper pole of the left kidney.   Biopsy left iliac bone lesion 12/10/2013 showed metastatic  renal cell carcinoma with sarcomatoid features.  2. Chronic anemia. Most likely secondary to iron deficiency and chronic GU blood loss. 3. Small volume hemoptysis. Likely related to the lung masses.   Disposition: Angelica Murphy has been diagnosed with metastatic renal cell carcinoma. Dr. Benay Spice reviewed the diagnosis, prognosis and treatment options with Angelica Murphy and her family. They understand that no therapy will be curative.  Dr. Benay Spice recommends a trial of Pazopanib. We reviewed potential toxicities including myelosuppression, nausea, diarrhea, skin rash, change in hair color, hypertension, blood clots, bleeding, QT prolongation, electrolyte abnormalities. She was provided with written information on pazopanib and will attend a chemotherapy education class.  We anticipate she will begin the Pazopanib around 12/18/2013. We will see her in followup on 01/01/2014. She or her family will contact the office in the interim with any problems.  Patient seen with Dr. Benay Spice. 25 minutes were spent face-to-face at today's visit with the majority of that time involved in counseling/coordination of care.    Owens Shark ANP/GNP-BC   12/13/2013  3:48 PM  This was assured visit with Angelica Murphy.  The left iliac biopsy confirmed metastatic renal cell carcinoma. I discussed treatment options with Angelica Murphy and her family. No therapy will be curative. I recommend pazopanib therapy. We reviewed the potential toxicities associated with this agent. She agrees to proceed. She will return for an office visit after approximately 2 weeks of therapy for clinical and laboratory followup.  Julieanne Manson, M.D.

## 2013-12-13 NOTE — Telephone Encounter (Signed)
gv adn printed appt sched and avs for pt for May °

## 2013-12-16 ENCOUNTER — Other Ambulatory Visit: Payer: Self-pay | Admitting: *Deleted

## 2013-12-16 ENCOUNTER — Encounter: Payer: Self-pay | Admitting: Oncology

## 2013-12-16 ENCOUNTER — Encounter: Payer: Medicare Other | Admitting: Oncology

## 2013-12-16 NOTE — Progress Notes (Signed)
Faxed pazopanib prescription to [facility]

## 2013-12-17 ENCOUNTER — Other Ambulatory Visit: Payer: Self-pay | Admitting: *Deleted

## 2013-12-17 ENCOUNTER — Other Ambulatory Visit: Payer: Medicare Other

## 2013-12-17 ENCOUNTER — Telehealth: Payer: Self-pay | Admitting: Oncology

## 2013-12-17 ENCOUNTER — Telehealth: Payer: Self-pay | Admitting: *Deleted

## 2013-12-17 ENCOUNTER — Encounter: Payer: Self-pay | Admitting: *Deleted

## 2013-12-17 MED ORDER — PROCHLORPERAZINE MALEATE 5 MG PO TABS: 5.0000 mg | ORAL_TABLET | Freq: Four times a day (QID) | ORAL | Status: AC | PRN

## 2013-12-17 MED ORDER — PAZOPANIB HCL 200 MG PO TABS: 800.0000 mg | ORAL_TABLET | Freq: Every day | ORAL | Status: DC

## 2013-12-17 NOTE — Telephone Encounter (Signed)
MEDICAL RECORDS FAXED TO [facility] @ [phone]

## 2013-12-17 NOTE — Telephone Encounter (Signed)
Message from chemo education nurse stating pt had some difficulty deciding when she will take Votrient. (Needs to be taken on an empty stomach.) She stated she will take med at bedtime. Rx for Compazine sent to pharmacy.

## 2013-12-17 NOTE — Telephone Encounter (Signed)
Votrient Rx given to [facility] in managed care. Per Dr. Benay Spice: Discontinue Protonix and Zocor due to suspected interaction with Votrient. Will address with pt during chemo education class today.

## 2013-12-17 NOTE — Telephone Encounter (Signed)
Received notification from [employer] that  Votrient med is approved through  12/18/2014.  Gave form to care management.

## 2013-12-18 ENCOUNTER — Encounter: Payer: Self-pay | Admitting: Nurse Practitioner

## 2013-12-18 ENCOUNTER — Other Ambulatory Visit: Payer: Medicare Other

## 2013-12-18 ENCOUNTER — Ambulatory Visit: Payer: Medicare Other | Admitting: Nurse Practitioner

## 2013-12-18 ENCOUNTER — Other Ambulatory Visit: Payer: Self-pay | Admitting: Internal Medicine

## 2013-12-18 NOTE — Progress Notes (Signed)
No date for treatment as of today. °

## 2013-12-19 ENCOUNTER — Telehealth: Payer: Self-pay | Admitting: *Deleted

## 2013-12-19 NOTE — Telephone Encounter (Signed)
Call from pt to clarify Votrient dose. Teaching reviewed, pt to take #4 tabs daily + 800 mg. Teach back method used, pt stated she will take at bedtime on empty stomach.

## 2013-12-20 ENCOUNTER — Encounter: Payer: Self-pay | Admitting: *Deleted

## 2013-12-20 NOTE — Progress Notes (Signed)
RECEIVED A FAX FROM [facility] CONCERNING A CONFIRMATION OF PRESCRIPTION SHIPMENT FOR VOTRIENT ON 12/19/13.

## 2013-12-22 ENCOUNTER — Emergency Department (HOSPITAL_COMMUNITY): Payer: Medicare Other

## 2013-12-22 ENCOUNTER — Encounter (HOSPITAL_COMMUNITY): Payer: Self-pay | Admitting: Emergency Medicine

## 2013-12-22 ENCOUNTER — Emergency Department (HOSPITAL_COMMUNITY)
Admission: EM | Admit: 2013-12-22 | Discharge: 2013-12-22 | Disposition: A | Discharge status: Home / Self Care | Payer: Medicare Other | Attending: Emergency Medicine | Admitting: Emergency Medicine

## 2013-12-22 DIAGNOSIS — R059 Cough, unspecified: Secondary | ICD-10-CM

## 2013-12-22 DIAGNOSIS — Z792 Long term (current) use of antibiotics: Secondary | ICD-10-CM | POA: Insufficient documentation

## 2013-12-22 DIAGNOSIS — C7952 Secondary malignant neoplasm of bone marrow: Secondary | ICD-10-CM

## 2013-12-22 DIAGNOSIS — C7951 Secondary malignant neoplasm of bone: Secondary | ICD-10-CM | POA: Insufficient documentation

## 2013-12-22 DIAGNOSIS — K219 Gastro-esophageal reflux disease without esophagitis: Secondary | ICD-10-CM | POA: Insufficient documentation

## 2013-12-22 DIAGNOSIS — R05 Cough: Secondary | ICD-10-CM | POA: Insufficient documentation

## 2013-12-22 DIAGNOSIS — Z862 Personal history of diseases of the blood and blood-forming organs and certain disorders involving the immune mechanism: Secondary | ICD-10-CM | POA: Insufficient documentation

## 2013-12-22 DIAGNOSIS — C7800 Secondary malignant neoplasm of unspecified lung: Secondary | ICD-10-CM | POA: Insufficient documentation

## 2013-12-22 DIAGNOSIS — R0602 Shortness of breath: Secondary | ICD-10-CM | POA: Insufficient documentation

## 2013-12-22 DIAGNOSIS — Z860100 Personal history of colon polyps, unspecified: Secondary | ICD-10-CM | POA: Insufficient documentation

## 2013-12-22 DIAGNOSIS — Z794 Long term (current) use of insulin: Secondary | ICD-10-CM | POA: Insufficient documentation

## 2013-12-22 DIAGNOSIS — E119 Type 2 diabetes mellitus without complications: Secondary | ICD-10-CM | POA: Insufficient documentation

## 2013-12-22 DIAGNOSIS — I10 Essential (primary) hypertension: Secondary | ICD-10-CM | POA: Insufficient documentation

## 2013-12-22 DIAGNOSIS — Z79899 Other long term (current) drug therapy: Secondary | ICD-10-CM | POA: Insufficient documentation

## 2013-12-22 DIAGNOSIS — Z85528 Personal history of other malignant neoplasm of kidney: Secondary | ICD-10-CM | POA: Insufficient documentation

## 2013-12-22 DIAGNOSIS — E785 Hyperlipidemia, unspecified: Secondary | ICD-10-CM | POA: Insufficient documentation

## 2013-12-22 HISTORY — DX: Malignant neoplasm of unspecified kidney, except renal pelvis: C64.9

## 2013-12-22 MED ORDER — HYDROCODONE-ACETAMINOPHEN 7.5-325 MG/15ML PO SOLN
15.0000 mL | Freq: Four times a day (QID) | ORAL | Status: DC | PRN
Start: 2013-12-22 — End: 2014-01-22

## 2013-12-22 NOTE — Discharge Instructions (Signed)
You have been evaluated for your cough.  You cough may be related to side effect of Ramipril. It was recommended for you to switch to bystolic 5mg  once daily and to stop taking Ramipril.  If this medication is too expensive, please discuss with your doctor if a blood pressure in the ARB family is appropriate.  Take Hycet for your cough but do not take any other tylenol product with it.  Return if your symptoms worsen or if you have other concerns.    Cough, Adult  A cough is a reflex that helps clear your throat and airways. It can help heal the body or may be a reaction to an irritated airway. A cough may only last 2 or 3 weeks (acute) or may last more than 8 weeks (chronic).  CAUSES Acute cough:  Viral or bacterial infections. Chronic cough:  Infections.  Allergies.  Asthma.  Post-nasal drip.  Smoking.  Heartburn or acid reflux.  Some medicines.  Chronic lung problems (COPD).  Cancer. SYMPTOMS   Cough.  Fever.  Chest pain.  Increased breathing rate.  High-pitched whistling sound when breathing (wheezing).  Colored mucus that you cough up (sputum). TREATMENT   A bacterial cough may be treated with antibiotic medicine.  A viral cough must run its course and will not respond to antibiotics.  Your caregiver may recommend other treatments if you have a chronic cough. HOME CARE INSTRUCTIONS   Only take over-the-counter or prescription medicines for pain, discomfort, or fever as directed by your caregiver. Use cough suppressants only as directed by your caregiver.  Use a cold steam vaporizer or humidifier in your bedroom or home to help loosen secretions.  Sleep in a semi-upright position if your cough is worse at night.  Rest as needed.  Stop smoking if you smoke. SEEK IMMEDIATE MEDICAL CARE IF:   You have pus in your sputum.  Your cough starts to worsen.  You cannot control your cough with suppressants and are losing sleep.  You begin coughing up  blood.  You have difficulty breathing.  You develop pain which is getting worse or is uncontrolled with medicine.  You have a fever. MAKE SURE YOU:   Understand these instructions.  Will watch your condition.  Will get help right away if you are not doing well or get worse. Document Released: 01/21/2011 Document Revised: 10/17/2011 Document Reviewed: 01/21/2011 [facility] Patient Information 2014 [place].

## 2013-12-22 NOTE — ED Notes (Signed)
Pt reports shortness of breath x 2 weeks. C/o cough since she was discharged from hospital. Cough causes shortness of breath. Denies chest pain or difficulty breathing at present

## 2013-12-22 NOTE — ED Provider Notes (Signed)
CSN: [id number]     Arrival date & time 12/22/13  1606 History   First MD Initiated Contact with Patient 12/22/13 1614     No chief complaint on file.    (Consider location/radiation/quality/duration/timing/severity/associated sxs/prior Treatment) HPI  78 year old female with history of renal cell carcinoma with metastases who presents for evaluations of shortness of breath. Pt with renal cell cancer was recently diagnosed 2 weeks ago when she presents with productive cough with hemoptysis. She stay in the hospital for a day and was discharge with tessalon for her cough and further management through Dr. Benay Spice who is now her oncologist. Pt sts since discharge from hospital, she begins to have persistent dry cough with occasional small amount of white sputum, but no more hemoptysis.  Cough is worsen at night and does induce shortness of breath when cough is persistent.  Taking tessalon has not helped.  She also c/o worsening L hip pain which also worsen from coughing.  She is currently sxs free when not coughing.  No fever, no report of green sputum, no other complaints.  She just started on her cancer medication yesterday including pazopanib along with compazine.  She also takes ramipril for HTN, and has been taking it for the past several years.    Past Medical History  Diagnosis Date  . Allergic rhinitis   . Diabetes mellitus     Type II  . Diverticulosis of colon   . GERD (gastroesophageal reflux disease)   . Hyperlipidemia   . Hypertension   . Hx of colonic polyps   . Gout   . Hemorrhoids   . Family history of colon cancer father  . Anemia   . Tachycardia, paroxysmal 09-2010   Past Surgical History  Procedure Laterality Date  . Abdominal hysterectomy  1972  . Cesarean section    . Cataract extraction, bilateral  February and March, 2014  . Breast lumpectomy  02/07    Left  (Young)  Benign  . Breast biopsy  02/08    left- Benign    Family History  Problem Relation Age of  Onset  . Kidney disease Mother   . Colon cancer Father   . Heart disease Sister     CVA  . Lung cancer Brother   . Breast cancer      neice   History  Substance Use Topics  . Smoking status: Never Smoker   . Smokeless tobacco: Never Used  . Alcohol Use: No   OB History   Grav Para Term Preterm Abortions TAB SAB Ect Mult Living                 Review of Systems  All other systems reviewed and are negative.     Allergies  Sulfonamide derivatives  Home Medications   Prior to Admission medications   Medication Sig Start Date End Date Taking? Authorizing Provider  acetaminophen (ARTHRITIS PAIN RELIEF) 650 MG CR tablet Take 650 mg by mouth every 8 (eight) hours as needed for pain.     Historical Provider, MD  amoxicillin-clavulanate (AUGMENTIN) 875-125 MG per tablet Take 1 tablet by mouth every 12 (twelve) hours. 12/11/13   Hosie Poisson, MD  benzonatate (TESSALON) 100 MG capsule TAKE 1 CAPSULE BY MOUTH 3 TIMES A DAY 12/18/13   Venia Carbon, MD  bisoprolol (ZEBETA) 5 MG tablet  11/30/13   Historical Provider, MD  feeding supplement, GLUCERNA SHAKE, (GLUCERNA SHAKE) LIQD Take 237 mLs by mouth daily. 12/11/13   Hosie Poisson,  MD  HYDROcodone-acetaminophen (NORCO/VICODIN) 5-325 MG per tablet Take 1 tablet by mouth every 6 (six) hours as needed for moderate pain. 12/10/13   Hosie Poisson, MD  insulin glargine (LANTUS) 100 UNIT/ML injection Inject 15 Units into the skin at bedtime.    Historical Provider, MD  Lancets Glory Rosebush ULTRASOFT) lancets  09/17/13   Historical Provider, MD  loratadine (CLARITIN) 10 MG tablet Take 10 mg by mouth daily.     Historical Provider, MD  metFORMIN (GLUCOPHAGE) 1000 MG tablet Take 1,000 mg by mouth 2 (two) times daily with a meal.    Historical Provider, MD  nebivolol (BYSTOLIC) 5 MG tablet Take 10 mg by mouth every morning.     Historical Provider, MD  ONE TOUCH ULTRA TEST test strip  09/17/13   Historical Provider, MD  pazopanib (VOTRIENT) 200 MG tablet  Take 4 tablets (800 mg total) by mouth daily. Take on an empty stomach. 12/17/13   Ladell Pier, MD  prochlorperazine (COMPAZINE) 5 MG tablet Take 1 tablet (5 mg total) by mouth every 6 (six) hours as needed for nausea or vomiting. 12/17/13   Ladell Pier, MD  ramipril (ALTACE) 10 MG capsule Take 10 mg by mouth every morning.    Historical Provider, MD   There were no vitals taken for this visit. Physical Exam  Nursing note and vitals reviewed. Constitutional: She is oriented to person, place, and time. She appears well-developed and well-nourished. No distress.  HENT:  Head: Atraumatic.  Mouth/Throat: Oropharynx is clear and moist.  Eyes: Conjunctivae are normal.  Neck: Neck supple. No JVD present.  Cardiovascular: Normal rate and regular rhythm.  Exam reveals no gallop and no friction rub.   No murmur heard. Pulmonary/Chest: Effort normal. She has rales (very faint rales heard to both lung bases, no wheezes or rhonchi.).  Abdominal: Soft. There is no tenderness.  Musculoskeletal: She exhibits tenderness (L hip: tenderness to L lateral hip with FROM, no overlying skin changes or deformity noted.  ).  Neurological: She is alert and oriented to person, place, and time.  Skin: No rash noted.  Psychiatric: She has a normal mood and affect.    ED Course  Procedures (including critical care time)  4:37 PM Patient here with persistent cough which can induce or shortness of breath. Also complaining of worsening left hip pain She recently diagnosed with renal cell carcinoma with metastases to both the lungs and to the left hip. This time patient appears to be comfortable, in no acute respiratory distress. Will obtain chest x-ray to rule out hospital acquired pneumonia. Will have patient ambulate will check an O2 sats. I will also consider different type of cough medication for her symptoms . She is currently on an ACE inhibitor, unsure if this is also responsible for her cough.   5:13  PM She has CXR without evidence of pna.  Ambulate without hypoxia.  Prior hospitalist's note indicate pt does not have PE from evaluation 2 weeks ago, however we are unable to locate a chest CTA to confirm this.  My suspicion for PE is low, however unable to r/o due to hx of cancer.  Will contact radiologist for further recommendation and to review prior CT.  Pt also has increase pain to L hip, which has evidence of mets.  Will xray to r/o pathologic fx.  Pt will also need to stop taking ramipril as it can also induce her cough.    5:19 PM Dr. Alvino Chapel did consult with radiologist, who  did confirmed that pt did not appear to have PE on CT scan.  Pt is not hypoxic and CXR without pna.  Her L hip xray demonstrates metastatic lesion to L iliac wing but no evidence of pathologic fracture.  Pt able to ambulate.    Labs Review Labs Reviewed - No data to display  Imaging Review Dg Chest 2 View  12/22/2013   CLINICAL DATA:  Cough, shortness of breath. Known pulmonary metastasis.  EXAM: CHEST  2 VIEW  COMPARISON:  Chest x-ray Dec 09, 2013  FINDINGS: The heart size and mediastinal contours are stable. The heart size is enlarged. Multiple pulmonary metastasis are identified not changed. There is no focal pneumonia of pleural effusion or pulmonary edema. The visualized skeletal structures are stable.  IMPRESSION: Stable pulmonary metastasis.  No acute abnormality identified.   Electronically Signed   By: Abelardo Diesel M.D.   On: 12/22/2013 16:55   Dg Hip Complete Left  12/22/2013   CLINICAL DATA:  Pain and left hip  EXAM: LEFT HIP - COMPLETE 2+ VIEW  COMPARISON:  None  FINDINGS: There is moderate to advanced osteoarthritis involving the left hip. A mild protrusio deformity is identified. No fracture or subluxation identified. Lucent lesion within the left iliac wing is identified measuring 2.8 cm.  IMPRESSION: 1. 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 2. Left iliac wing lytic metastasis.    Electronically Signed   By: Kerby Moors M.D.   On: 12/22/2013 17:31     EKG Interpretation None      MDM   Final diagnoses:  Cough    BP 159/82  Pulse 94  Temp(Src) 97.8 F (36.6 C) (Oral)  Resp 20  SpO2 98%  I have reviewed nursing notes and vital signs. I personally reviewed the imaging tests through PACS system  I reviewed available ER/hospitalization records thought the EMR     Domenic Moras, [place] 12/22/13 1806

## 2013-12-23 ENCOUNTER — Telehealth: Payer: Self-pay

## 2013-12-23 NOTE — Telephone Encounter (Signed)
Spoke to patient and then daughter Should not be taking the bystolic (too close to bisoprolol)  Unlikely that altace is causing the cough given known pulmonary mets Would continue current BP meds for now If we want to consider stopping the ramipril, just in case---would do it after she returns from trip and put her on losartan instead

## 2013-12-23 NOTE — ED Provider Notes (Signed)
Medical screening examination/treatment/procedure(s) were performed by non-physician practitioner and as supervising physician I was immediately available for consultation/collaboration.   EKG Interpretation None       Danajah Birdsell R. Teniola Tseng, MD 12/23/13 0002 

## 2013-12-23 NOTE — Telephone Encounter (Signed)
Patience Musca, pts daughter left v/m requesting information on BP meds, altace,bisoprolol and bystolic. Pt is scheduled to fly out of town on 12/24/13 and Levada Dy request cb today. Spoke with Levada Dy, pt is presently taking 2 BP meds, altace and bisoprolol. Levada Dy said pt was seen at  [facility] ED 12/22/13 and pt was advised to stop altace due to pt coughing and start taking bystolic 10 mg taking one daily. ED did not give rx for Bystolic. Levada Dy said on med list printed at [facility] on 46/27/03 bystolic is on that current med list but pt has not been taking med. Levada Dy is not aware of pts BP. Levada Dy request cb with Dr Alla German recommendation of what BP meds pt should be taking.

## 2014-01-01 ENCOUNTER — Other Ambulatory Visit (HOSPITAL_BASED_OUTPATIENT_CLINIC_OR_DEPARTMENT_OTHER): Payer: Medicare Other

## 2014-01-01 ENCOUNTER — Ambulatory Visit (HOSPITAL_BASED_OUTPATIENT_CLINIC_OR_DEPARTMENT_OTHER): Payer: Medicare Other | Admitting: Nurse Practitioner

## 2014-01-01 ENCOUNTER — Telehealth: Payer: Self-pay | Admitting: Nurse Practitioner

## 2014-01-01 ENCOUNTER — Other Ambulatory Visit: Payer: Self-pay

## 2014-01-01 VITALS — BP 155/102 | HR 80 | Temp 97.7°F | Resp 19 | Ht 63.0 in | Wt 119.3 lb

## 2014-01-01 DIAGNOSIS — I10 Essential (primary) hypertension: Secondary | ICD-10-CM

## 2014-01-01 DIAGNOSIS — C649 Malignant neoplasm of unspecified kidney, except renal pelvis: Secondary | ICD-10-CM

## 2014-01-01 DIAGNOSIS — R630 Anorexia: Secondary | ICD-10-CM

## 2014-01-01 DIAGNOSIS — D649 Anemia, unspecified: Secondary | ICD-10-CM

## 2014-01-01 LAB — COMPREHENSIVE METABOLIC PANEL (CC13)
ALBUMIN: 2.6 g/dL — AB (ref 3.5–5.0)
ALK PHOS: 143 U/L (ref 40–150)
ALT: 25 U/L (ref 0–55)
AST: 19 U/L (ref 5–34)
Anion Gap: 13 mEq/L — ABNORMAL HIGH (ref 3–11)
BILIRUBIN TOTAL: 1.17 mg/dL (ref 0.20–1.20)
BUN: 7.7 mg/dL (ref 7.0–26.0)
CO2: 23 mEq/L (ref 22–29)
Calcium: 9.9 mg/dL (ref 8.4–10.4)
Chloride: 96 mEq/L — ABNORMAL LOW (ref 98–109)
Creatinine: 0.8 mg/dL (ref 0.6–1.1)
Glucose: 159 mg/dl — ABNORMAL HIGH (ref 70–140)
POTASSIUM: 4.5 meq/L (ref 3.5–5.1)
SODIUM: 132 meq/L — AB (ref 136–145)
TOTAL PROTEIN: 7.8 g/dL (ref 6.4–8.3)

## 2014-01-01 LAB — CBC WITH DIFFERENTIAL/PLATELET
BASO%: 1.7 % (ref 0.0–2.0)
Basophils Absolute: 0.1 10*3/uL (ref 0.0–0.1)
EOS%: 0.2 % (ref 0.0–7.0)
Eosinophils Absolute: 0 10*3/uL (ref 0.0–0.5)
HCT: 39.9 % (ref 34.8–46.6)
HGB: 11.9 g/dL (ref 11.6–15.9)
LYMPH%: 18.6 % (ref 14.0–49.7)
MCH: 21 pg — AB (ref 25.1–34.0)
MCHC: 29.8 g/dL — AB (ref 31.5–36.0)
MCV: 70.4 fL — AB (ref 79.5–101.0)
MONO#: 0.5 10*3/uL (ref 0.1–0.9)
MONO%: 7.1 % (ref 0.0–14.0)
NEUT#: 5.6 10*3/uL (ref 1.5–6.5)
NEUT%: 72.4 % (ref 38.4–76.8)
PLATELETS: 438 10*3/uL — AB (ref 145–400)
RBC: 5.66 10*6/uL — AB (ref 3.70–5.45)
RDW: 24.8 % — ABNORMAL HIGH (ref 11.2–14.5)
WBC: 7.8 10*3/uL (ref 3.9–10.3)
lymph#: 1.4 10*3/uL (ref 0.9–3.3)

## 2014-01-01 LAB — MAGNESIUM (CC13): Magnesium: 1.8 mg/dl (ref 1.5–2.5)

## 2014-01-01 LAB — PHOSPHORUS: Phosphorus: 3.3 mg/dL (ref 2.3–4.6)

## 2014-01-01 LAB — CEA: CEA: 1.7 ng/mL (ref 0.0–5.0)

## 2014-01-01 NOTE — Telephone Encounter (Signed)
, °

## 2014-01-01 NOTE — Patient Instructions (Signed)
Increase Bisoprolol to 10 mg daily Return for blood pressure check next week

## 2014-01-01 NOTE — Progress Notes (Signed)
  [place] OFFICE PROGRESS NOTE   Diagnosis:  Metastatic renal cell carcinoma.  INTERVAL HISTORY:   Angelica Murphy returns as scheduled. She began Pazopanib on 12/21/2013. She denies nausea/vomiting. No mouth sores. No diarrhea. No skin rash. She denies abdominal pain. She had an episode of left hip pain recently. The pain has resolved. She reports stable dyspnea. No cough. No fever. She denies bleeding. Appetite is poor. She is losing weight.  Objective:  Vital signs in last 24 hours:  Blood pressure 155/102, pulse 80, temperature 97.7 F (36.5 C), temperature source Oral, resp. rate 19, height 5\' 3"  (1.6 m), weight 119 lb 4.8 oz (54.114 kg).    HEENT: No thrush or ulceration. Resp: Lungs clear. Cardio: Regular cardiac rhythm. GI: Abdomen soft and nontender. No organomegaly. Vascular: No leg edema. Calves soft and nontender. Neuro: Alert and oriented.  Skin: No rash.    Lab Results:  Lab Results  Component Value Date   WBC 7.8 01/01/2014   HGB 11.9 01/01/2014   HCT 39.9 01/01/2014   MCV 70.4* 01/01/2014   PLT 438* 01/01/2014   NEUTROABS 5.6 01/01/2014    Imaging:  No results found.  Medications: I have reviewed the patient's current medications.  Assessment/Plan: 1. Metastatic renal cell carcinoma presenting with hemoptysis on 12/09/2013.  CT scans showed innumerable lung nodules and masses, right hilar and precarinal lymphadenopathy, lytic lesion anterior left iliac wing, 10 cm mass in the right kidney, 2.2 cm mass in the upper pole of the left kidney.  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 Pazopanib initiated 12/21/2013.  2. Chronic anemia. Most likely secondary to iron deficiency and chronic GU blood loss. Improved. 3. Small volume hemoptysis. Likely related to the lung masses. 4. Anorexia/weight loss. 5. Hypertension. Currently on bisoprolol and altace.   Disposition: Clinical  status appears unchanged. She will continue Pazopanib.   Blood pressure is elevated from baseline. She will increase bisoprolol from 5 mg daily to 10 mg daily. We will have her return for a repeat blood pressure in one week.  She is experiencing anorexia/weight loss. We discussed an appetite stimulant. She would like to try dietary maneuvers first. She will contact the office with continued weight loss and we will initiate Megace.  She will return for a followup visit in 3 weeks. She will contact the office in the interim as outlined above or with any other problems. Repeat chest x-ray planned after being on Pazopanib for approximately 8 weeks.  Plan reviewed with Dr. Benay Spice.      Owens Shark ANP/GNP-BC   01/01/2014  11:45 AM

## 2014-01-02 ENCOUNTER — Telehealth: Payer: Self-pay | Admitting: Nurse Practitioner

## 2014-01-02 ENCOUNTER — Telehealth: Payer: Self-pay | Admitting: *Deleted

## 2014-01-02 NOTE — Telephone Encounter (Signed)
Call from pt asking if she needs to come in next week for appt? Yes, BP check with escorts. Informed her schedulers will call with date and time. Wait to speak to RN after BP check. She voiced understanding. Pt reports she has increased to Bisoprolol.

## 2014-01-02 NOTE — Telephone Encounter (Signed)
, °

## 2014-01-03 NOTE — Progress Notes (Signed)
12-18-2013 1310  OT G-codes **NOT FOR INPATIENT CLASS**  Functional Assessment Tool Used clinical judgement  Functional Limitation Self care  Self Care Current Status [phone]) CI  Self Care Goal Status (E3212) CI  Self Care Discharge Status [phone]) CI  G codes for Dec 18, 2013 Pauline Aus OTR/L [id number] 01/03/2014

## 2014-01-06 ENCOUNTER — Telehealth: Payer: Self-pay | Admitting: *Deleted

## 2014-01-06 NOTE — Telephone Encounter (Signed)
Calling to ask for list of reliable agencies who would come to patient's home and cook 2-3 meals/week. Understands they will need to pay for the service. She has been eating better with someone cooking healthy, colorful meals for her. She has restocked fridge and needs help with meal preparation when she leaves town on 6/3. Informed her that social worker, Abby Potash will be excellent resource. Called social worker, Johnnye Lana and she will call daughter today or tomorrow morning.

## 2014-01-07 ENCOUNTER — Encounter: Payer: Self-pay | Admitting: *Deleted

## 2014-01-07 ENCOUNTER — Other Ambulatory Visit: Payer: Self-pay | Admitting: *Deleted

## 2014-01-07 DIAGNOSIS — C649 Malignant neoplasm of unspecified kidney, except renal pelvis: Secondary | ICD-10-CM

## 2014-01-07 NOTE — Progress Notes (Signed)
[place] Work  Clinical Social Work was referred by Therapist, sports for assessment of psychosocial needs.  Clinical Social Worker contacted patient and patient's daughter at home to offer support and assess for needs.  Patient's daughter is visiting from [place] and expressed concern for her mother's nutritional and home care needs.  CSW informed pt's daughter of nutritional services at [facility].  Pt and pt's daughter requested a referral to [place]; CSW will notify RN to complete referral.  CSW and pt's daughter also discussed the difference between home care services and home health services.  CSW also explained that home care services required out of pocket cost.  CSW sent patient's daughter an electronic list of all private duty agencies in the area.  CSW encouraged pt and pt's family to call with any additional questions or concerns.    Johnnye Lana, MSW, [place] Worker [facility] [phone]

## 2014-01-08 ENCOUNTER — Ambulatory Visit: Payer: Medicare Other

## 2014-01-09 ENCOUNTER — Telehealth: Payer: Self-pay | Admitting: Oncology

## 2014-01-09 NOTE — Telephone Encounter (Signed)
S/w the pt and she is aware of her nutrition appt

## 2014-01-14 ENCOUNTER — Other Ambulatory Visit: Payer: Self-pay | Admitting: *Deleted

## 2014-01-14 NOTE — Telephone Encounter (Signed)
THIS REFILL REQUEST FOR VOTRIENT WAS PLACED ON DR.SHERRILL'S DESK.

## 2014-01-15 MED ORDER — PAZOPANIB HCL 200 MG PO TABS: 800.0000 mg | ORAL_TABLET | Freq: Every day | ORAL | Status: DC

## 2014-01-15 NOTE — Addendum Note (Signed)
Addended by: Tania Ade on: 01/15/2014 08:45 AM   Modules accepted: Orders

## 2014-01-15 NOTE — Telephone Encounter (Signed)
Votrient script received by [facility] and is in insurance verification process.

## 2014-01-16 ENCOUNTER — Ambulatory Visit: Payer: Medicare Other | Admitting: Nutrition

## 2014-01-16 NOTE — Progress Notes (Signed)
78 year old female diagnosed with metastatic renal cell cancer.  Past medical history includes diabetes, diverticulosis, GERD, hyperlipidemia, hypertension, gout, and kidney cancer.  Medications include Lantus, Glucophage, and Compazine.  Labs include sodium 132, glucose 159, albumin 2.6 on May 27.  Height: 63 inches. Weight: 118.4 pounds June 11. Usual body weight: 135-140 pounds. BMI: 21.14.  Patient reports poor appetite with associated weight loss.  She denies nausea and vomiting, constipation or diarrhea.  She denies pain.  Patient drinks regular ensure 2-3 times daily.  Patient also expresses taste alterations.  Nutrition diagnosis: Unintended weight loss related to poor appetite as evidenced by 14% weight loss from usual body weight.  Intervention: Patient and daughter educated on the importance of small, frequent meals and snacks throughout the day.  Educated patient on ways to increase calories.  Recommended patient increase ensure to Ensure Plus 3 times a day to 4 times a day as tolerated.  Provided fact sheets.  Samples were provided.  Questions were answered.  Teach back method used.  Monitoring, evaluation, goals: Patient will tolerate increased calories and protein.  Minimize further weight loss.  Next visit: Patient has my contact information for questions concerns.

## 2014-01-21 ENCOUNTER — Telehealth: Payer: Self-pay | Admitting: *Deleted

## 2014-01-21 NOTE — Telephone Encounter (Signed)
Reviewed RN's notes below and last two encounters where patient's blood pressure remained elevated. I suspect she may ultimately need to have her Votrient dose reduced but will increase her bisoprolol first to see if this will manage her hypertension. If she doesn't have a lower b/p over the next 24 to 36 hours strongly urge her to call back so we can reduce her Votrient.  Assessment/ Plan: Hypertension d/t Votrient -  * Pt currently only Altace 10 mg daily  * pt currently on bisoprolol (Zebeta)- will increase from 10 mg to 15 mg. Literature does not recommend any dose above 20 mg.   Strongly urge patient and family to call back if increase in meds does not result in noticeable drop in b/p over the next 24- 36 hours. Also caution patient to call if any headache, blurred vision or new symptoms.   Burns Spain, AOCNP, NP-C

## 2014-01-21 NOTE — Telephone Encounter (Signed)
RECEIVED A FAX FROM [facility] CONCERNING A CONFIRMATION OF PRESCRIPTION SHIPMENT FOR VOTRIENT ON 01/20/14.

## 2014-01-21 NOTE — Telephone Encounter (Signed)
   Provider input needed: elevated BP   Reason for call: elevated BP   Cardiovascular: positive for elevated BP   ALLERGIES:  is allergic to sulfonamide derivatives.  Patient last received chemotherapy/ treatment on n/a--Pt is on votrient.  Patient was last seen in the office on 01/01/14  Next appt is 01/22/14  Is patient having fevers greater than 100.5?  no   Is patient having uncontrolled pain, or new pain? Yes upper abdominal pain rated # 5 -This was tolerable for pt & didn't want to take anything for pain.   Is patient having new back pain that changes with position (worsens or eases when laying down?)  no   Is patient able to eat and drink? yes    Is patient able to pass stool without difficulty?   yes     Is patient having uncontrolled nausea?  no    family calls 01/21/2014 with complaint of  Cardiovascular: positive for elevated BP 204/127 repeated in left arm 198/121-repeated @ 10 min later 161/104   Summary Based on the above information advised family that we would discuss with her MD or NP    Jesse Fall  01/21/2014, 1:02 PM   Background Info  Angelica Murphy   DOB: [birth year]   MR#: [id number]   CSN#   [id number] 01/21/2014

## 2014-01-21 NOTE — Telephone Encounter (Signed)
Called & spoke with pt's granddaughter, Janett Billow & informed to give pt another 5 mg bisoprolol today & start 15 mg daily tomorrow.  Instructed her to call if BP is now down in 24-36 hours b/c we may have to decrease her votrient & especially if she develops any other symptoms such as H/A, blurred vision, etc.  Pt's granddaughter expresses understanding.

## 2014-01-22 ENCOUNTER — Other Ambulatory Visit: Payer: Self-pay

## 2014-01-22 ENCOUNTER — Other Ambulatory Visit: Payer: Self-pay | Admitting: *Deleted

## 2014-01-22 ENCOUNTER — Other Ambulatory Visit (HOSPITAL_BASED_OUTPATIENT_CLINIC_OR_DEPARTMENT_OTHER): Payer: Medicare Other

## 2014-01-22 ENCOUNTER — Ambulatory Visit (HOSPITAL_BASED_OUTPATIENT_CLINIC_OR_DEPARTMENT_OTHER): Payer: Medicare Other | Admitting: Oncology

## 2014-01-22 ENCOUNTER — Telehealth: Payer: Self-pay | Admitting: *Deleted

## 2014-01-22 ENCOUNTER — Telehealth: Payer: Self-pay | Admitting: Oncology

## 2014-01-22 VITALS — BP 174/93 | HR 98 | Temp 96.9°F | Resp 18 | Ht 63.0 in | Wt 115.7 lb

## 2014-01-22 DIAGNOSIS — D649 Anemia, unspecified: Secondary | ICD-10-CM

## 2014-01-22 DIAGNOSIS — R918 Other nonspecific abnormal finding of lung field: Secondary | ICD-10-CM

## 2014-01-22 DIAGNOSIS — I10 Essential (primary) hypertension: Secondary | ICD-10-CM

## 2014-01-22 DIAGNOSIS — R630 Anorexia: Secondary | ICD-10-CM

## 2014-01-22 DIAGNOSIS — R748 Abnormal levels of other serum enzymes: Secondary | ICD-10-CM

## 2014-01-22 DIAGNOSIS — C7952 Secondary malignant neoplasm of bone marrow: Secondary | ICD-10-CM

## 2014-01-22 DIAGNOSIS — C649 Malignant neoplasm of unspecified kidney, except renal pelvis: Secondary | ICD-10-CM

## 2014-01-22 DIAGNOSIS — R042 Hemoptysis: Secondary | ICD-10-CM

## 2014-01-22 DIAGNOSIS — R229 Localized swelling, mass and lump, unspecified: Secondary | ICD-10-CM

## 2014-01-22 DIAGNOSIS — C7951 Secondary malignant neoplasm of bone: Secondary | ICD-10-CM

## 2014-01-22 DIAGNOSIS — R634 Abnormal weight loss: Secondary | ICD-10-CM

## 2014-01-22 LAB — COMPREHENSIVE METABOLIC PANEL (CC13)
ALBUMIN: 3.1 g/dL — AB (ref 3.5–5.0)
ALT: 338 U/L (ref 0–55)
AST: 150 U/L — ABNORMAL HIGH (ref 5–34)
Alkaline Phosphatase: 431 U/L — ABNORMAL HIGH (ref 40–150)
Anion Gap: 20 mEq/L — ABNORMAL HIGH (ref 3–11)
BUN: 17 mg/dL (ref 7.0–26.0)
CO2: 21 meq/L — AB (ref 22–29)
Calcium: 10.4 mg/dL (ref 8.4–10.4)
Chloride: 94 mEq/L — ABNORMAL LOW (ref 98–109)
Creatinine: 0.9 mg/dL (ref 0.6–1.1)
GLUCOSE: 222 mg/dL — AB (ref 70–140)
POTASSIUM: 4.3 meq/L (ref 3.5–5.1)
SODIUM: 135 meq/L — AB (ref 136–145)
TOTAL PROTEIN: 8.4 g/dL — AB (ref 6.4–8.3)
Total Bilirubin: 1.77 mg/dL — ABNORMAL HIGH (ref 0.20–1.20)

## 2014-01-22 LAB — CBC WITH DIFFERENTIAL/PLATELET
BASO%: 0.9 % (ref 0.0–2.0)
Basophils Absolute: 0.1 10*3/uL (ref 0.0–0.1)
EOS ABS: 0 10*3/uL (ref 0.0–0.5)
EOS%: 0.4 % (ref 0.0–7.0)
HCT: 51 % — ABNORMAL HIGH (ref 34.8–46.6)
HEMOGLOBIN: 15.7 g/dL (ref 11.6–15.9)
LYMPH%: 22.7 % (ref 14.0–49.7)
MCH: 23.5 pg — ABNORMAL LOW (ref 25.1–34.0)
MCHC: 30.7 g/dL — ABNORMAL LOW (ref 31.5–36.0)
MCV: 76.4 fL — AB (ref 79.5–101.0)
MONO#: 0.6 10*3/uL (ref 0.1–0.9)
MONO%: 7.1 % (ref 0.0–14.0)
NEUT#: 5.4 10*3/uL (ref 1.5–6.5)
NEUT%: 68.9 % (ref 38.4–76.8)
PLATELETS: 331 10*3/uL (ref 145–400)
RBC: 6.67 10*6/uL — AB (ref 3.70–5.45)
RDW: 32.3 % — ABNORMAL HIGH (ref 11.2–14.5)
WBC: 7.8 10*3/uL (ref 3.9–10.3)
lymph#: 1.8 10*3/uL (ref 0.9–3.3)

## 2014-01-22 LAB — PHOSPHORUS: PHOSPHORUS: 5.1 mg/dL — AB (ref 2.3–4.6)

## 2014-01-22 LAB — MAGNESIUM (CC13): MAGNESIUM: 2.1 mg/dL (ref 1.5–2.5)

## 2014-01-22 MED ORDER — MEGESTROL ACETATE 40 MG/ML PO SUSP: 200.0000 mg | Freq: Two times a day (BID) | ORAL | Status: DC

## 2014-01-22 MED ORDER — HYDROCODONE-ACETAMINOPHEN 7.5-325 MG/15ML PO SOLN: 15.0000 mL | Freq: Four times a day (QID) | ORAL | Status: DC | PRN

## 2014-01-22 NOTE — Telephone Encounter (Signed)
Gave pt appt for lab and MD for june 2015

## 2014-01-22 NOTE — Progress Notes (Signed)
  [place] OFFICE PROGRESS NOTE   Diagnosis: Renal cell carcinoma  INTERVAL HISTORY:   She returns as scheduled. She continues pazopanib. She continues to have malaise. She complains of abdominal pain. She is not taking pain medication. The blood pressure was elevated yesterday and she phoned the office. She took an increased dose of bisoprolol. The stool is loose, no diarrhea.  She states in the house the majority of the time.  Objective:  Vital signs in last 24 hours:  Blood pressure 174/93, pulse 98, temperature 96.9 F (36.1 C), resp. rate 18, height 5\' 3"  (1.6 m), weight 115 lb 11.2 oz (52.481 kg), SpO2 100.00%.    HEENT: No thrush or ulcers Resp: Lungs clear bilaterally Cardio: Regular rate and rhythm GI: No hepatosplenomegaly, nontender, no mass Vascular: No leg edema  Skin: No rash    Lab Results:  Lab Results  Component Value Date   WBC 7.8 01/22/2014   HGB 15.7 01/22/2014   HCT 51.0* 01/22/2014   MCV 76.4* 01/22/2014   PLT 331 01/22/2014   NEUTROABS 5.4 01/22/2014   Potassium 4.3, BUN 17, creatinine 0.9, calcium 10.4, alkaline phosphatase 431, albumin 3.1, AST 150, ALT 338, bilirubin 1.77    Medications: I have reviewed the patient's current medications.  Assessment/Plan: 1. Metastatic renal cell carcinoma presenting with hemoptysis on 12/09/2013.  CT scans showed innumerable lung nodules and masses, right hilar and precarinal lymphadenopathy, lytic lesion anterior left iliac wing, 10 cm mass in the right kidney, 2.2 cm mass in the upper pole of the left kidney.  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  Pazopanib initiated 12/21/2013.  2. Chronic anemia. Most likely secondary to iron deficiency and chronic GU blood loss. Improved. 3. Small volume hemoptysis. Likely related to the lung masses. 4. Anorexia/weight loss. 5. Hypertension. Currently on bisoprolol and  altace. 6. Elevated liver enzymes-likely secondary to Pazopanib 7. Abdominal pain-potentially related to the renal mass   Disposition:  Her performance status continues to decline. I am concerned this is secondary to progression of the renal cell carcinoma. The liver enzymes were elevated. Pazopaib will be held. We added Megace as an appetite stimulant. We refilled hydrocodone for pain.  The blood pressure is mildly elevated today. This should improve with discontinuation of the pazopanib. She will continue bisoprolol and ramipril at the chronic doses.  Ms. Mastro will return for an office visit and repeat liver panel 01/28/2014. We will see her in the interim for new symptoms.  Betsy Coder, MD  01/22/2014  12:22 PM

## 2014-01-22 NOTE — Telephone Encounter (Signed)
Received fax from [place] for prior auth for megestrol.  Placed in [place] bin/[place].

## 2014-01-23 ENCOUNTER — Encounter: Payer: Self-pay | Admitting: Oncology

## 2014-01-23 ENCOUNTER — Other Ambulatory Visit: Payer: Self-pay | Admitting: *Deleted

## 2014-01-23 ENCOUNTER — Telehealth: Payer: Self-pay | Admitting: *Deleted

## 2014-01-23 DIAGNOSIS — C649 Malignant neoplasm of unspecified kidney, except renal pelvis: Secondary | ICD-10-CM

## 2014-01-23 NOTE — Progress Notes (Signed)
Faxed megestrol pa form to [facility]

## 2014-01-23 NOTE — Telephone Encounter (Signed)
Called to follow up on status of PA for Megace. Pharmacy told her not approved yet. Made her aware that the completed form was sent to [facility] today at 12:46, and we hope to hear something soon. Check with the pharmacy later today before you go to pick it up.

## 2014-01-23 NOTE — Progress Notes (Signed)
MD aware of phosphorus results. Will recheck on 01/28/14.

## 2014-01-24 ENCOUNTER — Encounter: Payer: Self-pay | Admitting: Oncology

## 2014-01-24 NOTE — Progress Notes (Signed)
[facility] approved megestrol from 01/23/14-01/24/15

## 2014-01-28 ENCOUNTER — Other Ambulatory Visit (HOSPITAL_BASED_OUTPATIENT_CLINIC_OR_DEPARTMENT_OTHER): Payer: Medicare Other

## 2014-01-28 ENCOUNTER — Telehealth: Payer: Self-pay | Admitting: Oncology

## 2014-01-28 ENCOUNTER — Ambulatory Visit (HOSPITAL_BASED_OUTPATIENT_CLINIC_OR_DEPARTMENT_OTHER): Payer: Medicare Other | Admitting: Oncology

## 2014-01-28 VITALS — BP 134/67 | HR 87 | Temp 97.4°F | Resp 17 | Ht 63.0 in | Wt 118.4 lb

## 2014-01-28 DIAGNOSIS — R5383 Other fatigue: Secondary | ICD-10-CM

## 2014-01-28 DIAGNOSIS — D509 Iron deficiency anemia, unspecified: Secondary | ICD-10-CM

## 2014-01-28 DIAGNOSIS — C649 Malignant neoplasm of unspecified kidney, except renal pelvis: Secondary | ICD-10-CM

## 2014-01-28 DIAGNOSIS — D500 Iron deficiency anemia secondary to blood loss (chronic): Secondary | ICD-10-CM

## 2014-01-28 DIAGNOSIS — R5381 Other malaise: Secondary | ICD-10-CM

## 2014-01-28 DIAGNOSIS — K922 Gastrointestinal hemorrhage, unspecified: Secondary | ICD-10-CM

## 2014-01-28 DIAGNOSIS — I10 Essential (primary) hypertension: Secondary | ICD-10-CM

## 2014-01-28 LAB — COMPREHENSIVE METABOLIC PANEL (CC13)
ALK PHOS: 451 U/L — AB (ref 40–150)
ALT: 213 U/L — AB (ref 0–55)
AST: 113 U/L — AB (ref 5–34)
Albumin: 2.7 g/dL — ABNORMAL LOW (ref 3.5–5.0)
Anion Gap: 8 mEq/L (ref 3–11)
BUN: 9.3 mg/dL (ref 7.0–26.0)
CO2: 28 mEq/L (ref 22–29)
CREATININE: 0.8 mg/dL (ref 0.6–1.1)
Calcium: 9.7 mg/dL (ref 8.4–10.4)
Chloride: 101 mEq/L (ref 98–109)
Glucose: 97 mg/dl (ref 70–140)
Potassium: 4.1 mEq/L (ref 3.5–5.1)
Sodium: 136 mEq/L (ref 136–145)
Total Bilirubin: 1.27 mg/dL — ABNORMAL HIGH (ref 0.20–1.20)
Total Protein: 7.4 g/dL (ref 6.4–8.3)

## 2014-01-28 LAB — CBC WITH DIFFERENTIAL/PLATELET
BASO%: 1 % (ref 0.0–2.0)
BASOS ABS: 0.1 10*3/uL (ref 0.0–0.1)
EOS ABS: 0.2 10*3/uL (ref 0.0–0.5)
EOS%: 2.6 % (ref 0.0–7.0)
HEMATOCRIT: 35.6 % (ref 34.8–46.6)
HGB: 11.1 g/dL — ABNORMAL LOW (ref 11.6–15.9)
LYMPH%: 19.3 % (ref 14.0–49.7)
MCH: 24.7 pg — ABNORMAL LOW (ref 25.1–34.0)
MCHC: 31.2 g/dL — ABNORMAL LOW (ref 31.5–36.0)
MCV: 79.1 fL — ABNORMAL LOW (ref 79.5–101.0)
MONO#: 0.7 10*3/uL (ref 0.1–0.9)
MONO%: 11.2 % (ref 0.0–14.0)
NEUT%: 65.9 % (ref 38.4–76.8)
NEUTROS ABS: 4.3 10*3/uL (ref 1.5–6.5)
PLATELETS: 306 10*3/uL (ref 145–400)
RBC: 4.51 10*6/uL (ref 3.70–5.45)
RDW: 34 % — ABNORMAL HIGH (ref 11.2–14.5)
WBC: 6.6 10*3/uL (ref 3.9–10.3)
lymph#: 1.3 10*3/uL (ref 0.9–3.3)

## 2014-01-28 LAB — PHOSPHORUS: Phosphorus: 3 mg/dL (ref 2.3–4.6)

## 2014-01-28 LAB — FERRITIN CHCC: FERRITIN: 718 ng/mL — AB (ref 9–269)

## 2014-01-28 NOTE — Progress Notes (Signed)
  [place] OFFICE PROGRESS NOTE   Diagnosis: Renal cell carcinoma  INTERVAL HISTORY:   She returns as scheduled. Her energy level and appetite are improved. She did not begin Megace. She has discomfort at the right upper back. This is not severe. She was able to travel to [facility] last weekend. No hemoptysis  Objective:  Vital signs in last 24 hours:  Blood pressure 134/67, pulse 87, temperature 97.4 F (36.3 C), temperature source Oral, resp. rate 17, height $RemoveBe'5\' 3"'YBfBBumdB$  (1.6 m), weight 118 lb 6.4 oz (53.706 kg), SpO2 100.00%.    HEENT: The mucous membranes are moist, healing ulcer at the right side of the tongue, no thrush Resp: Lungs clear bilaterally Cardio: Regular rate and rhythm GI: No hepatosplenomegaly, nontender, no mass Vascular: No leg edema Musculoskeletal: No tenderness over the back  Skin: Palms without erythema   Portacath/PICC-without erythema  Lab Results:  Lab Results  Component Value Date   WBC 6.6 01/28/2014   HGB 11.1* 01/28/2014   HCT 35.6 01/28/2014   MCV 79.1* 01/28/2014   PLT 306 01/28/2014   NEUTROABS 4.3 01/28/2014   Potassium 4.1, BUN 9.3, cracking 0.8, alk phosphatase 451, albumin 2.7, AST 113, ALT 213, bilirubin 1.27   Imaging:  No results found.  Medications: I have reviewed the patient's current medications.  Assessment/Plan: 1. Metastatic renal cell carcinoma presenting with hemoptysis on 12/09/2013.  CT scans showed innumerable lung nodules and masses, right hilar and precarinal lymphadenopathy, lytic lesion anterior left iliac wing, 10 cm mass in the right kidney, 2.2 cm mass in the upper pole of the left kidney.  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  Pazopanib initiated 12/21/2013.  Pazopanib discontinued 01/22/2014 2. Chronic anemia. Most likely secondary to iron deficiency and chronic GU blood loss. Improved. 3. Small volume hemoptysis. Likely related to  the lung masses. 4. Anorexia/weight loss. 5. Hypertension. Currently on bisoprolol and altace. Improved. 6. Elevated liver enzymes-likely secondary to Pazopanib, improved   Disposition:  Her performance status appears much improved today. I suspect the malaise was related to Pazopanib. She will remain off of Pazopanib and return for an office visit and repeat liver enzymes in 2 weeks. We will check a chest x-ray when she returns in 2 weeks.  We will consider restarting Pazopanib at a reduced dose in 2 weeks pending her clinical status, the chest x-ray findings, and the liver enzymes.  Betsy Coder, MD  01/28/2014  2:13 PM

## 2014-01-28 NOTE — Telephone Encounter (Signed)
gv adn printed appt sched and avs for pt for July  °

## 2014-02-03 ENCOUNTER — Telehealth: Payer: Self-pay | Admitting: Internal Medicine

## 2014-02-03 NOTE — Telephone Encounter (Signed)
Form on your desk  

## 2014-02-03 NOTE — Telephone Encounter (Signed)
Form done No charge Stamp address please

## 2014-02-03 NOTE — Telephone Encounter (Signed)
Pts spouse dropped off form to be filled out for a handicapp parking placard. Please call pt at [phone] when form is ready to be picked up. Form given to [facility] to give to Dr Silvio Pate. Thank you

## 2014-02-04 NOTE — Telephone Encounter (Signed)
Form placed in the front office to pick up and pt is aware

## 2014-02-05 ENCOUNTER — Other Ambulatory Visit: Payer: Self-pay

## 2014-02-05 NOTE — Telephone Encounter (Signed)
Levada Dy pts daughter said pt put order in at [facility] 4 or 5 days ago; our office has not received request for refill of bisoprolol. Pt is out of med;  On pts historical med list appears bisoprolol was discontinued. Levada Dy said pt has been taking Bisoprolol and request cb when refill sent or further instructions.

## 2014-02-06 ENCOUNTER — Telehealth: Payer: Self-pay | Admitting: Oncology

## 2014-02-06 NOTE — Telephone Encounter (Signed)
Left message on VM that requests have been going to Dr. Benay Spice her oncologist they have been adjusting her medication. Asked daugther to return my call.

## 2014-02-06 NOTE — Telephone Encounter (Signed)
Spoke with daughter she will contact Dr. Gearldine Shown office

## 2014-02-06 NOTE — Telephone Encounter (Signed)
Okay to refill #90 x 0 I haven't seen her since her cancer diagnosis Please confirm with that she is still taking this (with her or Dr Gearldine Shown office)

## 2014-02-06 NOTE — Telephone Encounter (Signed)
pt called and cancelled appt nurse notified

## 2014-02-06 NOTE — Telephone Encounter (Signed)
Can be #180 if 2 daily (though there is a 10mg  dose so not sure why it would be that way)

## 2014-02-11 ENCOUNTER — Telehealth: Payer: Self-pay | Admitting: *Deleted

## 2014-02-11 ENCOUNTER — Other Ambulatory Visit: Payer: Self-pay | Admitting: *Deleted

## 2014-02-11 ENCOUNTER — Other Ambulatory Visit: Payer: Medicare Other

## 2014-02-11 ENCOUNTER — Ambulatory Visit (HOSPITAL_COMMUNITY): Payer: Medicare Other

## 2014-02-11 ENCOUNTER — Ambulatory Visit: Payer: Medicare Other | Admitting: Nurse Practitioner

## 2014-02-11 NOTE — Telephone Encounter (Signed)
Refill request for Votrient received and forwarded to collaborative nurse desk. Patient had cancelled her 02/11/14 appointment and Votrient has been on hold since 01/22/14. Was to be seen this week for labs/CXR/OV.  Called home and spoke with Richardine Service was at grocery store. Requested he have patient call office.

## 2014-02-11 NOTE — Telephone Encounter (Signed)
Message from pt's daughter reporting pt has decided to transfer her cancer care to [facility]. Will make Dr. Benay Spice aware. Per daughter, she has a follow up appt at [facility] on 02/12/14. If liver enzymes are better the plan is to resume Votrient at a reduced dose. Instructed Levada Dy to have pt call office as needed. She voiced appreciation for call.

## 2014-02-13 ENCOUNTER — Ambulatory Visit (INDEPENDENT_AMBULATORY_CARE_PROVIDER_SITE_OTHER): Payer: Medicare Other | Admitting: Internal Medicine

## 2014-02-13 ENCOUNTER — Encounter: Payer: Self-pay | Admitting: Internal Medicine

## 2014-02-13 VITALS — BP 122/60 | HR 88 | Temp 97.9°F | Wt 118.0 lb

## 2014-02-13 DIAGNOSIS — C649 Malignant neoplasm of unspecified kidney, except renal pelvis: Secondary | ICD-10-CM

## 2014-02-13 DIAGNOSIS — N3000 Acute cystitis without hematuria: Secondary | ICD-10-CM

## 2014-02-13 LAB — POCT URINALYSIS DIPSTICK
GLUCOSE UA: NEGATIVE
KETONES UA: NEGATIVE
NITRITE UA: NEGATIVE
RBC UA: NEGATIVE
Spec Grav, UA: 1.025
Urobilinogen, UA: 1
pH, UA: 7

## 2014-02-13 MED ORDER — CIPROFLOXACIN HCL 250 MG PO TABS: 250.0000 mg | ORAL_TABLET | Freq: Two times a day (BID) | ORAL | Status: DC

## 2014-02-13 NOTE — Progress Notes (Signed)
Pre visit review using our clinic review tool, if applicable. No additional management support is needed unless otherwise documented below in the visit note. 

## 2014-02-13 NOTE — Progress Notes (Signed)
Subjective:    Patient ID: Angelica Murphy, female    DOB: 1936/03/20, 78 y.o.   MRN: [id number]  HPI Here with her 2 daughters  Having trouble voiding Has had urgency---can't control it now Some burning dysuria-not strong Darker amber color--- not clear hematuria  Seemed to be disoriented last night Recent lab at [facility]-- some elevated white count  No fever Has had increasing back in the past weeks  Current Outpatient Prescriptions on File Prior to Visit  Medication Sig Dispense Refill  . bisoprolol (ZEBETA) 5 MG tablet Take 10 mg by mouth daily.       Marland Kitchen ibuprofen (ADVIL,MOTRIN) 200 MG tablet Take 400 mg by mouth every 6 (six) hours as needed for moderate pain.      Marland Kitchen insulin glargine (LANTUS) 100 UNIT/ML injection Inject 15 Units into the skin at bedtime.      . Lancets (ONETOUCH ULTRASOFT) lancets       . megestrol (MEGACE ORAL) 40 MG/ML suspension Take 5 mLs (200 mg total) by mouth 2 (two) times daily.  300 mL  0  . metFORMIN (GLUCOPHAGE) 1000 MG tablet Take 1,000 mg by mouth 2 (two) times daily with a meal.      . ONE TOUCH ULTRA TEST test strip       . pazopanib (VOTRIENT) 200 MG tablet Take 4 tablets (800 mg total) by mouth daily. Take on an empty stomach.  120 tablet  0  . prochlorperazine (COMPAZINE) 5 MG tablet Take 1 tablet (5 mg total) by mouth every 6 (six) hours as needed for nausea or vomiting.  20 tablet  0  . ramipril (ALTACE) 10 MG capsule Take 10 mg by mouth every morning.       No current facility-administered medications on file prior to visit.    Allergies  Allergen Reactions  . Sulfonamide Derivatives     REACTION: eyes red and blurry    Past Medical History  Diagnosis Date  . Allergic rhinitis   . Diabetes mellitus     Type II  . Diverticulosis of colon   . GERD (gastroesophageal reflux disease)   . Hyperlipidemia   . Hypertension   . Hx of colonic polyps   . Gout   . Hemorrhoids   . Family history of colon cancer father  . Anemia   .  Tachycardia, paroxysmal 09-2010  . Kidney carcinoma     Past Surgical History  Procedure Laterality Date  . Abdominal hysterectomy  1972  . Cesarean section    . Cataract extraction, bilateral  February and March, 2014  . Breast lumpectomy  02/07    Left  (Young)  Benign  . Breast biopsy  02/08    left- Benign     Family History  Problem Relation Age of Onset  . Kidney disease Mother   . Colon cancer Father   . Heart disease Sister     CVA  . Lung cancer Brother   . Breast cancer      neice    History   Social History  . Marital Status: Married    Spouse Name: N/A    Number of Children: 3  . Years of Education: N/A   Occupational History  . 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 Smoking status: Never Smoker   . Smokeless tobacco: Never Used  . Alcohol Use: No  . Drug Use: No  . Sexual  Activity: Not on file   Other Topics Concern  . Not on file   Social History Narrative   Has old living will   No formal health care POA-- but requests son, Haywood Lasso   Would accept resuscitation but no prolonged ventilation   No feeding tube if cognitively unaware   Review of Systems No nausea or vomiting Had drastic weight loss after diagnosis-- stabilized somewhat Hasn't started the megace yet due to elevated liver function    Objective:   Physical Exam  Constitutional: No distress.  Abdominal: Soft. She exhibits no distension. There is no tenderness. There is no rebound and no guarding.  Musculoskeletal:  No CVA tenderness  Neurological:  Mentally clear now          Assessment & Plan:

## 2014-02-13 NOTE — Assessment & Plan Note (Signed)
Reviewed her diagnosis and the metastatic aspect Currently continuing with the Rx---discussed my care if her active treatment is over

## 2014-02-13 NOTE — Addendum Note (Signed)
Addended by: Despina Hidden on: 02/13/2014 03:56 PM   Modules accepted: Orders

## 2014-02-13 NOTE — Addendum Note (Signed)
Addended by: Despina Hidden on: 02/13/2014 03:48 PM   Modules accepted: Orders

## 2014-02-13 NOTE — Patient Instructions (Signed)
If your urinary symptoms resolve with 1 or 2 antibiotic doses, you can stop after 3 days of the antibiotic. If you are not getting better by next week, we will need to try to get a sample again.

## 2014-02-13 NOTE — Assessment & Plan Note (Signed)
Was unable to urinate in container, so sample was lost Symptoms consistent with cystitis Will treat empirically with 3 days of cipro (continue if symptoms aren't quickly resolved) Will try again for culture if symptoms persist

## 2014-02-15 LAB — URINE CULTURE: Colony Count: 5000

## 2014-02-20 ENCOUNTER — Ambulatory Visit: Payer: Medicare Other | Admitting: Internal Medicine

## 2014-02-20 ENCOUNTER — Telehealth: Payer: Self-pay | Admitting: *Deleted

## 2014-02-20 NOTE — Telephone Encounter (Signed)
Pt missed her appt. with Dr. Silvio Pate today. Dr. Silvio Pate asked me to call pt and see how she is doing and if there was anything going on that caused her to miss her appt.   Called home # and no answer so left voicemail requesting pt to call office

## 2014-02-20 NOTE — Telephone Encounter (Signed)
Good to hear Will see her next week

## 2014-02-20 NOTE — Telephone Encounter (Signed)
Called pt's daughter and she wasn't aware of this appt. she said the hospital may have made it when she wasn't in the room with pt at the hospital. appt r/s until Monday 02/24/14, daughter did advise me that nothing major happened to pt that caused her to miss this appt

## 2014-02-21 ENCOUNTER — Other Ambulatory Visit: Payer: Self-pay | Admitting: Internal Medicine

## 2014-02-24 ENCOUNTER — Encounter: Payer: Self-pay | Admitting: Internal Medicine

## 2014-02-24 ENCOUNTER — Ambulatory Visit (INDEPENDENT_AMBULATORY_CARE_PROVIDER_SITE_OTHER): Payer: Medicare Other | Admitting: Internal Medicine

## 2014-02-24 VITALS — BP 108/60 | HR 89 | Temp 98.2°F | Wt 114.0 lb

## 2014-02-24 DIAGNOSIS — C7949 Secondary malignant neoplasm of other parts of nervous system: Secondary | ICD-10-CM

## 2014-02-24 DIAGNOSIS — C649 Malignant neoplasm of unspecified kidney, except renal pelvis: Secondary | ICD-10-CM

## 2014-02-24 DIAGNOSIS — E119 Type 2 diabetes mellitus without complications: Secondary | ICD-10-CM

## 2014-02-24 DIAGNOSIS — C7931 Secondary malignant neoplasm of brain: Secondary | ICD-10-CM | POA: Insufficient documentation

## 2014-02-24 NOTE — Assessment & Plan Note (Signed)
With presentation requiring admission for delirium Better now on decadron Starting RT soon to brain

## 2014-02-24 NOTE — Patient Instructions (Signed)
Please continue the lantus at 22 units daily. If your morning sugars are consistently over 200, you can add another 2 units (like go up to 24) every few days until it is back under 200 fasting.

## 2014-02-24 NOTE — Progress Notes (Signed)
Subjective:    Patient ID: Angelica Murphy, female    DOB: 12-Feb-1936, 78 y.o.   MRN: [id number]  HPI Here with husband Reviewed her recent [facility] admission Delirium found to be due to brain mets Now on decadron and will start radiation at [facility] on Friday (brain)  She notes more fatigue Appetite is better---probably from the decadron She states she never started the megace  She still wants to continue with all offered therapies  Current Outpatient Prescriptions on File Prior to Visit  Medication Sig Dispense Refill  . amLODipine (NORVASC) 5 MG tablet Take 5 mg by mouth daily.       . bisoprolol (ZEBETA) 5 MG tablet Take 10 mg by mouth daily.       . ciprofloxacin (CIPRO) 250 MG tablet Take 1 tablet (250 mg total) by mouth 2 (two) times daily.  20 tablet  0  . ibuprofen (ADVIL,MOTRIN) 200 MG tablet Take 400 mg by mouth every 6 (six) hours as needed for moderate pain.      Marland Kitchen insulin glargine (LANTUS) 100 UNIT/ML injection Inject 15 Units into the skin at bedtime.      . Lancets (ONETOUCH ULTRASOFT) lancets       . metFORMIN (GLUCOPHAGE) 1000 MG tablet Take 1,000 mg by mouth 2 (two) times daily with a meal.      . ONE TOUCH ULTRA TEST test strip USE ONCE A DAY DX. 250.00  100 each  4  . oxyCODONE (OXY IR/ROXICODONE) 5 MG immediate release tablet Take 5 mg by mouth every 6 (six) hours as needed.       . pazopanib (VOTRIENT) 200 MG tablet Take 4 tablets (800 mg total) by mouth daily. Take on an empty stomach.  120 tablet  0  . prochlorperazine (COMPAZINE) 5 MG tablet Take 1 tablet (5 mg total) by mouth every 6 (six) hours as needed for nausea or vomiting.  20 tablet  0  . ramipril (ALTACE) 10 MG capsule Take 10 mg by mouth every morning.      . ranitidine (ZANTAC) 150 MG capsule Take 150 mg by mouth 2 (two) times daily.        No current facility-administered medications on file prior to visit.    Allergies  Allergen Reactions  . Sulfonamide Derivatives     REACTION: eyes red and  blurry    Past Medical History  Diagnosis Date  . Allergic rhinitis   . Diabetes mellitus     Type II  . Diverticulosis of colon   . GERD (gastroesophageal reflux disease)   . Hyperlipidemia   . Hypertension   . Hx of colonic polyps   . Gout   . Hemorrhoids   . Family history of colon cancer father  . Anemia   . Tachycardia, paroxysmal 09-2010  . Kidney carcinoma     Past Surgical History  Procedure Laterality Date  . Abdominal hysterectomy  1972  . Cesarean section    . Cataract extraction, bilateral  February and March, 2014  . Breast lumpectomy  02/07    Left  (Young)  Benign  . Breast biopsy  02/08    left- Benign     Family History  Problem Relation Age of Onset  . Kidney disease Mother   . Colon cancer Father   . Heart disease Sister     CVA  . Lung cancer Brother   . Breast cancer      neice    History  Social History  . Marital Status: Married    Spouse Name: N/A    Number of Children: 3  . Years of Education: N/A   Occupational History  . 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 Smoking status: Never Smoker   . Smokeless tobacco: Never Used  . Alcohol Use: No  . Drug Use: No  . Sexual Activity: Not on file   Other Topics Concern  . Not on file   Social History Narrative   Has old living will   No formal health care POA-- but requests son, Angelica Murphy   Would accept resuscitation but no prolonged ventilation   No feeding tube if cognitively unaware   Review of Systems Sleeping okay Some constipation just recently--plans to restart her miralax Seen by dentist for problems with left lower molar problems--- got chlorhexidine rinse Some trouble with high sugars-- after starting the decadron    Objective:   Physical Exam  Constitutional: No distress.  Neurological: No cranial nerve deficit. She exhibits normal muscle tone.  No focal weakness          Assessment & Plan:

## 2014-02-24 NOTE — Assessment & Plan Note (Addendum)
Sugars up with the decadron lantus increased to 22 Discussed goals of Rx--- keep under 300 and avoid hypoglycemia  No further change if fasting sugars remain under 200 (189 this AM)

## 2014-02-24 NOTE — Assessment & Plan Note (Signed)
Seems to be rapidly progressing They may try the oral chemo again depending on how she does after the radiation

## 2014-02-26 ENCOUNTER — Encounter: Payer: Medicare Other | Admitting: Internal Medicine

## 2014-02-28 ENCOUNTER — Other Ambulatory Visit: Payer: Self-pay | Admitting: Internal Medicine

## 2014-03-03 ENCOUNTER — Inpatient Hospital Stay (HOSPITAL_COMMUNITY)
Admission: EM | Admit: 2014-03-03 | Discharge: 2014-03-04 | DRG: 812 | Disposition: A | Discharge status: Home / Self Care | Payer: Medicare Other | Attending: Internal Medicine | Admitting: Internal Medicine

## 2014-03-03 ENCOUNTER — Emergency Department (HOSPITAL_COMMUNITY): Payer: Medicare Other

## 2014-03-03 ENCOUNTER — Encounter (HOSPITAL_COMMUNITY): Payer: Self-pay | Admitting: Emergency Medicine

## 2014-03-03 DIAGNOSIS — E119 Type 2 diabetes mellitus without complications: Secondary | ICD-10-CM | POA: Diagnosis present

## 2014-03-03 DIAGNOSIS — C7949 Secondary malignant neoplasm of other parts of nervous system: Secondary | ICD-10-CM

## 2014-03-03 DIAGNOSIS — D72829 Elevated white blood cell count, unspecified: Secondary | ICD-10-CM | POA: Diagnosis present

## 2014-03-03 DIAGNOSIS — R42 Dizziness and giddiness: Secondary | ICD-10-CM | POA: Diagnosis present

## 2014-03-03 DIAGNOSIS — K5900 Constipation, unspecified: Secondary | ICD-10-CM | POA: Diagnosis present

## 2014-03-03 DIAGNOSIS — M109 Gout, unspecified: Secondary | ICD-10-CM | POA: Diagnosis present

## 2014-03-03 DIAGNOSIS — R195 Other fecal abnormalities: Secondary | ICD-10-CM

## 2014-03-03 DIAGNOSIS — Z794 Long term (current) use of insulin: Secondary | ICD-10-CM

## 2014-03-03 DIAGNOSIS — D649 Anemia, unspecified: Principal | ICD-10-CM | POA: Diagnosis present

## 2014-03-03 DIAGNOSIS — Z803 Family history of malignant neoplasm of breast: Secondary | ICD-10-CM | POA: Diagnosis not present

## 2014-03-03 DIAGNOSIS — R197 Diarrhea, unspecified: Secondary | ICD-10-CM

## 2014-03-03 DIAGNOSIS — K219 Gastro-esophageal reflux disease without esophagitis: Secondary | ICD-10-CM | POA: Diagnosis present

## 2014-03-03 DIAGNOSIS — I10 Essential (primary) hypertension: Secondary | ICD-10-CM | POA: Diagnosis present

## 2014-03-03 DIAGNOSIS — R0902 Hypoxemia: Secondary | ICD-10-CM | POA: Diagnosis present

## 2014-03-03 DIAGNOSIS — Z923 Personal history of irradiation: Secondary | ICD-10-CM

## 2014-03-03 DIAGNOSIS — Z800 Family history of malignant neoplasm of digestive organs: Secondary | ICD-10-CM | POA: Diagnosis not present

## 2014-03-03 DIAGNOSIS — C7952 Secondary malignant neoplasm of bone marrow: Secondary | ICD-10-CM

## 2014-03-03 DIAGNOSIS — C7931 Secondary malignant neoplasm of brain: Secondary | ICD-10-CM | POA: Diagnosis present

## 2014-03-03 DIAGNOSIS — C649 Malignant neoplasm of unspecified kidney, except renal pelvis: Secondary | ICD-10-CM | POA: Diagnosis present

## 2014-03-03 DIAGNOSIS — Z801 Family history of malignant neoplasm of trachea, bronchus and lung: Secondary | ICD-10-CM

## 2014-03-03 DIAGNOSIS — Z823 Family history of stroke: Secondary | ICD-10-CM | POA: Diagnosis not present

## 2014-03-03 DIAGNOSIS — C7951 Secondary malignant neoplasm of bone: Secondary | ICD-10-CM | POA: Diagnosis present

## 2014-03-03 DIAGNOSIS — E785 Hyperlipidemia, unspecified: Secondary | ICD-10-CM | POA: Diagnosis present

## 2014-03-03 DIAGNOSIS — Z9221 Personal history of antineoplastic chemotherapy: Secondary | ICD-10-CM

## 2014-03-03 DIAGNOSIS — R55 Syncope and collapse: Secondary | ICD-10-CM

## 2014-03-03 DIAGNOSIS — C7800 Secondary malignant neoplasm of unspecified lung: Secondary | ICD-10-CM | POA: Diagnosis present

## 2014-03-03 LAB — COMPREHENSIVE METABOLIC PANEL
ALK PHOS: 209 U/L — AB (ref 39–117)
ALT: 25 U/L (ref 0–35)
AST: 34 U/L (ref 0–37)
Albumin: 1.4 g/dL — ABNORMAL LOW (ref 3.5–5.2)
Anion gap: 15 (ref 5–15)
BILIRUBIN TOTAL: 0.6 mg/dL (ref 0.3–1.2)
BUN: 13 mg/dL (ref 6–23)
CHLORIDE: 95 meq/L — AB (ref 96–112)
CO2: 22 meq/L (ref 19–32)
Calcium: 8.5 mg/dL (ref 8.4–10.5)
Creatinine, Ser: 0.46 mg/dL — ABNORMAL LOW (ref 0.50–1.10)
GFR calc non Af Amer: >90 mL/min (ref >90)
GLUCOSE: 156 mg/dL — AB (ref 70–99)
POTASSIUM: 4.4 meq/L (ref 3.7–5.3)
Sodium: 132 mEq/L — ABNORMAL LOW (ref 137–147)
TOTAL PROTEIN: 5.9 g/dL — AB (ref 6.0–8.3)

## 2014-03-03 LAB — URINALYSIS, ROUTINE W REFLEX MICROSCOPIC
BILIRUBIN URINE: NEGATIVE
Glucose, UA: NEGATIVE mg/dL
Ketones, ur: NEGATIVE mg/dL
NITRITE: NEGATIVE
PROTEIN: NEGATIVE mg/dL
Specific Gravity, Urine: 1.011 (ref 1.005–1.030)
Urobilinogen, UA: 0.2 mg/dL (ref 0.0–1.0)
pH: 6 (ref 5.0–8.0)

## 2014-03-03 LAB — CBC WITH DIFFERENTIAL/PLATELET
Basophils Absolute: 0 10*3/uL (ref 0.0–0.1)
Basophils Relative: 0 % (ref 0–1)
EOS ABS: 0 10*3/uL (ref 0.0–0.7)
EOS PCT: 0 % (ref 0–5)
HCT: 22.3 % — ABNORMAL LOW (ref 36.0–46.0)
Hemoglobin: 7 g/dL — ABNORMAL LOW (ref 12.0–15.0)
LYMPHS PCT: 6 % — AB (ref 12–46)
Lymphs Abs: 1.5 10*3/uL (ref 0.7–4.0)
MCH: 25.4 pg — ABNORMAL LOW (ref 26.0–34.0)
MCHC: 31.4 g/dL (ref 30.0–36.0)
MCV: 80.8 fL (ref 78.0–100.0)
Monocytes Absolute: 1.2 10*3/uL — ABNORMAL HIGH (ref 0.1–1.0)
Monocytes Relative: 5 % (ref 3–12)
NEUTROS ABS: 21.8 10*3/uL — AB (ref 1.7–7.7)
Neutrophils Relative %: 89 % — ABNORMAL HIGH (ref 43–77)
Platelets: 378 10*3/uL (ref 150–400)
RBC: 2.76 MIL/uL — AB (ref 3.87–5.11)
RDW: 23.3 % — ABNORMAL HIGH (ref 11.5–15.5)
WBC: 24.5 10*3/uL — ABNORMAL HIGH (ref 4.0–10.5)

## 2014-03-03 LAB — URINE MICROSCOPIC-ADD ON

## 2014-03-03 LAB — GLUCOSE, CAPILLARY: Glucose-Capillary: 161 mg/dL — ABNORMAL HIGH (ref 70–99)

## 2014-03-03 MED ORDER — ONDANSETRON HCL 4 MG PO TABS: 4.0000 mg | ORAL_TABLET | Freq: Four times a day (QID) | ORAL | Status: DC | PRN

## 2014-03-03 MED ORDER — OXYCODONE HCL 5 MG PO TABS: 5.0000 mg | ORAL_TABLET | Freq: Four times a day (QID) | ORAL | Status: DC | PRN

## 2014-03-03 MED ORDER — SODIUM CHLORIDE 0.9 % IV SOLN
INTRAVENOUS | Status: DC
  Administered 2014-03-04 (×2): via INTRAVENOUS

## 2014-03-03 MED ORDER — SODIUM CHLORIDE 0.9 % IV BOLUS (SEPSIS)
1000.0000 mL | INTRAVENOUS | Status: AC
  Administered 2014-03-03: 1000 mL via INTRAVENOUS

## 2014-03-03 MED ORDER — CHLORHEXIDINE GLUCONATE 0.12 % MT SOLN
15.0000 mL | Freq: Two times a day (BID) | OROMUCOSAL | Status: DC
Start: 2014-03-04 — End: 2014-03-04
  Administered 2014-03-04 (×2): 15 mL via OROMUCOSAL
  Filled 2014-03-03 (×4): qty 15

## 2014-03-03 MED ORDER — INSULIN GLARGINE 100 UNIT/ML ~~LOC~~ SOLN
10.0000 [IU] | Freq: Every day | SUBCUTANEOUS | Status: DC
  Administered 2014-03-04: 10 [IU] via SUBCUTANEOUS
  Filled 2014-03-03 (×2): qty 0.1

## 2014-03-03 MED ORDER — ONDANSETRON HCL 4 MG/2ML IJ SOLN
4.0000 mg | Freq: Once | INTRAMUSCULAR | Status: AC
  Administered 2014-03-03: 4 mg via INTRAVENOUS
  Filled 2014-03-03: qty 2

## 2014-03-03 MED ORDER — ONDANSETRON 8 MG/NS 50 ML IVPB
8.0000 mg | Freq: Four times a day (QID) | INTRAVENOUS | Status: DC | PRN
  Filled 2014-03-03: qty 8

## 2014-03-03 MED ORDER — INSULIN ASPART 100 UNIT/ML ~~LOC~~ SOLN
0.0000 [IU] | Freq: Four times a day (QID) | SUBCUTANEOUS | Status: DC
  Administered 2014-03-04: 2 [IU] via SUBCUTANEOUS
  Administered 2014-03-04: 1 [IU] via SUBCUTANEOUS

## 2014-03-03 MED ORDER — PROCHLORPERAZINE MALEATE 5 MG PO TABS
5.0000 mg | ORAL_TABLET | Freq: Four times a day (QID) | ORAL | Status: DC | PRN
  Filled 2014-03-03: qty 1

## 2014-03-03 MED ORDER — AMLODIPINE BESYLATE 5 MG PO TABS
5.0000 mg | ORAL_TABLET | Freq: Every day | ORAL | Status: DC
  Administered 2014-03-04: 5 mg via ORAL
  Filled 2014-03-03: qty 1

## 2014-03-03 MED ORDER — BIOTENE DRY MOUTH MT LIQD
15.0000 mL | Freq: Two times a day (BID) | OROMUCOSAL | Status: DC
  Administered 2014-03-04: 15 mL via OROMUCOSAL

## 2014-03-03 MED ORDER — DEXAMETHASONE 4 MG PO TABS
4.0000 mg | ORAL_TABLET | Freq: Every day | ORAL | Status: DC
  Administered 2014-03-04: 4 mg via ORAL
  Filled 2014-03-03: qty 1

## 2014-03-03 MED ORDER — PANTOPRAZOLE SODIUM 40 MG IV SOLR
40.0000 mg | Freq: Two times a day (BID) | INTRAVENOUS | Status: DC
  Administered 2014-03-04 (×2): 40 mg via INTRAVENOUS
  Filled 2014-03-03 (×5): qty 40

## 2014-03-03 MED ORDER — SODIUM CHLORIDE 0.9 % IJ SOLN
3.0000 mL | Freq: Two times a day (BID) | INTRAMUSCULAR | Status: DC
  Administered 2014-03-04: 3 mL via INTRAVENOUS

## 2014-03-03 MED ORDER — BISOPROLOL FUMARATE 10 MG PO TABS
10.0000 mg | ORAL_TABLET | Freq: Every day | ORAL | Status: DC
  Administered 2014-03-04: 10 mg via ORAL
  Filled 2014-03-03: qty 1

## 2014-03-03 NOTE — ED Notes (Signed)
Bed: WA09 Expected date:  Expected time:  Means of arrival:  Comments: 78 y.o. F NVD [place]

## 2014-03-03 NOTE — Progress Notes (Signed)
Family asking that patient's oncologist at [facility] be consulted due to concern that her treatments may be causing current issues.

## 2014-03-03 NOTE — H&P (Signed)
[facility] History and Physical  Patient: Angelica Murphy  OZD:[id number]  DOB: 11-29-1935  DOS: the patient was seen and examined on 03/03/2014 PCP: Viviana Simpler, MD  Chief Complaint: Dizziness and diarrhea  HPI: CHARLESA EHLE is a 78 y.o. female with Past medical history of diabetes mellitus, GERD, hypertension, metastatic renal cell carcinoma with metastasis to brain, skull, lungs. Patient presented with dizziness and lightheadedness preceded by diarrhea. She mentions that she was recently admitted with confusion and was found to have CNS metastasis of her renal cell cancer. She was started on Decadron and has undergone one cycle of radiation to her brain.she was also recently restarted back on Pazopanib. Since last 3 days the patient had constipation she did some stool softener and an enema without any benefit. She took some more stool softener with which she had  2-3 episodes of diarrhea. After that she started having complaints of dizziness and lightheadedness. When she went for a bowel movement again she had hard time walking back to the room the cause of dizziness and generalized weakness. She denies any fever, chills, abdominal pain, burning urination, diarrhea or vomiting after her arrival to the ER. She had one episode of vomiting prior to her with the ER.  The patient is coming from home. And at her baseline independent for most of her ADL.  Review of Systems: as mentioned in the history of present illness.  A Comprehensive review of the other systems is negative.  Past Medical History  Diagnosis Date  . Allergic rhinitis   . Diabetes mellitus     Type II  . Diverticulosis of colon   . GERD (gastroesophageal reflux disease)   . Hyperlipidemia   . Hypertension   . Hx of colonic polyps   . Gout   . Hemorrhoids   . Family history of colon cancer father  . Anemia   . Tachycardia, paroxysmal 09-2010  . Kidney carcinoma    Past Surgical History  Procedure  Laterality Date  . Abdominal hysterectomy  1972  . Cesarean section    . Cataract extraction, bilateral  February and March, 2014  . Breast lumpectomy  02/07    Left  ([place])  Benign  . Breast biopsy  02/08    left- Benign    Social History:  reports that she has never smoked. She has never used smokeless tobacco. She reports that she does not drink alcohol or use illicit drugs.  Allergies  Allergen Reactions  . Sulfonamide Derivatives     REACTION: eyes red and blurry    Family History  Problem Relation Age of Onset  . Kidney disease Mother   . Colon cancer Father   . Heart disease Sister     CVA  . Lung cancer Brother   . Breast cancer      neice    Prior to Admission medications   Medication Sig Start Date End Date Taking? Authorizing Provider  amLODipine (NORVASC) 5 MG tablet Take 5 mg by mouth daily.  02/05/14 02/05/15 Yes Historical Provider, MD  bisoprolol (ZEBETA) 5 MG tablet Take 10 mg by mouth daily.  11/30/13  Yes Historical Provider, MD  dexamethasone (DECADRON) 4 MG tablet Take 4 mg by mouth daily.  02/18/14  Yes Historical Provider, MD  ibuprofen (ADVIL,MOTRIN) 200 MG tablet Take 400 mg by mouth every 6 (six) hours as needed for moderate pain.   Yes Historical Provider, MD  insulin glargine (LANTUS) 100 UNIT/ML injection Inject 24 Units into the  skin at bedtime.    Yes Historical Provider, MD  Lancets (ONETOUCH ULTRASOFT) lancets  09/17/13  Yes Historical Provider, MD  metFORMIN (GLUCOPHAGE) 1000 MG tablet Take 1,000 mg by mouth 2 (two) times daily with a meal.   Yes Historical Provider, MD  ONE TOUCH ULTRA TEST test strip USE ONCE A DAY DX. 250.00   Yes Venia Carbon, MD  oxyCODONE (OXY IR/ROXICODONE) 5 MG immediate release tablet Take 5 mg by mouth every 6 (six) hours as needed.  02/12/14 03/14/14 Yes Historical Provider, MD  pazopanib (VOTRIENT) 200 MG tablet Take 600 mg by mouth daily. Take 3 tablets Take on an empty stomach.   Yes Historical Provider, MD   prochlorperazine (COMPAZINE) 5 MG tablet Take 1 tablet (5 mg total) by mouth every 6 (six) hours as needed for nausea or vomiting. 12/17/13  Yes Ladell Pier, MD  ramipril (ALTACE) 10 MG capsule Take 10 mg by mouth every morning.   Yes Historical Provider, MD  ranitidine (ZANTAC) 150 MG capsule Take 150 mg by mouth 2 (two) times daily.  02/05/14 02/05/15 Yes Historical Provider, MD    Physical Exam: Filed Vitals:   03/03/14 1758 03/03/14 2054  BP: 110/51 112/50  Pulse: 81 83  Temp: 97.8 F (36.6 C)   TempSrc: Oral   Resp: 16 18  SpO2: 96% 90%    General: Alert, Awake and Oriented to Time, Place and Person. Appear in mild distress Eyes: PERRL ENT: Oral Mucosa shows presence of left-sided tumor, otherwise moist. Neck: No  JVD Cardiovascular: S1 and S2 Present, no  Murmur, Peripheral Pulses Present Respiratory: Bilateral Air entry equal and Decreased, Clear to Auscultation,  no Crackles,  no  wheezes Abdomen: Bowel Sound Present, Soft and Non tender Skin: No  Rash Extremities:  bilateral Pedal edema,  no  calf tenderness Neurologic: Grossly no focal neuro deficit.  Labs on Admission:  CBC:  Recent Labs Lab 03/03/14 1931  WBC 24.5*  NEUTROABS 21.8*  HGB 7.0*  HCT 22.3*  MCV 80.8  PLT 378    CMP     Component Value Date/Time   NA 132* 03/03/2014 1931   NA 136 01/28/2014 1106   K 4.4 03/03/2014 1931   K 4.1 01/28/2014 1106   CL 95* 03/03/2014 1931   CL 105 11/19/2012 1312   CO2 22 03/03/2014 1931   CO2 28 01/28/2014 1106   GLUCOSE 156* 03/03/2014 1931   GLUCOSE 97 01/28/2014 1106   GLUCOSE 100* 11/19/2012 1312   BUN 13 03/03/2014 1931   BUN 9.3 01/28/2014 1106   CREATININE 0.46* 03/03/2014 1931   CREATININE 0.8 01/28/2014 1106   CALCIUM 8.5 03/03/2014 1931   CALCIUM 9.7 01/28/2014 1106   PROT 5.9* 03/03/2014 1931   PROT 7.4 01/28/2014 1106   ALBUMIN 1.4* 03/03/2014 1931   ALBUMIN 2.7* 01/28/2014 1106   AST 34 03/03/2014 1931   AST 113* 01/28/2014 1106   ALT 25 03/03/2014 1931    ALT 213* 01/28/2014 1106   ALKPHOS 209* 03/03/2014 1931   ALKPHOS 451* 01/28/2014 1106   BILITOT 0.6 03/03/2014 1931   BILITOT 1.27* 01/28/2014 1106   GFRNONAA >90 03/03/2014 1931   GFRAA >90 03/03/2014 1931    No results found for this basename: LIPASE, AMYLASE,  in the last 168 hours No results found for this basename: AMMONIA,  in the last 168 hours  No results found for this basename: CKTOTAL, CKMB, CKMBINDEX, TROPONINI,  in the last 168 hours BNP (last 3 results)  No results found for this basename: PROBNP,  in the last 8760 hours  Radiological Exams on Admission: Dg Chest 2 View  03/03/2014   CLINICAL DATA:  Syncope. History of metastatic renal cell carcinoma.  EXAM: CHEST  2 VIEW  COMPARISON:  CT chest 12/09/2013.  PA and lateral chest 12/22/2013.  FINDINGS: Bilateral pulmonary nodules and masses are seen. Peripheral opacity in the left lower lung zone appears worsened. The appearance of the chest is otherwise not markedly changed. Heart size is normal. No pneumothorax is identified.  IMPRESSION: Pulmonary nodules and masses consistent with metastatic renal cell carcinoma. Peripheral opacity in the left lower lung zone has worsened and likely represents progressive metastatic disease.   Electronically Signed   By: Inge Rise M.D.   On: 03/03/2014 19:21   Ct Head Wo Contrast  03/03/2014   CLINICAL DATA:  Near syncope. History of metastatic renal cell carcinoma.  EXAM: CT HEAD WITHOUT CONTRAST  TECHNIQUE: Contiguous axial images were obtained from the base of the skull through the vertex without intravenous contrast.  COMPARISON:  None.  FINDINGS: Scattered calvarial metastatic deposits are identified. The largest lesion is in the left parietal bone where there is destruction of both the inner and outer tables. No acute intracranial abnormality including infarct, hemorrhage, mass lesion, mass effect, midline shift or abnormal ex collection is identified there is some cortical atrophy and  chronic microvascular ischemic change.  IMPRESSION: No acute intracranial abnormality.  Lytic lesions in the calvarium consistent with metastatic renal cell carcinoma.  Atrophy and chronic microvascular ischemic change.   Electronically Signed   By: Inge Rise M.D.   On: 03/03/2014 20:58    EKG: Independently reviewed. normal sinus rhythm, nonspecific ST and T waves changes. Assessment/Plan Principal Problem:   Anemia Active Problems:   DIABETES MELLITUS, TYPE II   HYPERLIPIDEMIA   HYPERTENSION   GERD   Renal cell carcinoma   Brain metastases   1. Anemia  patient is presenting with complaints of generalized weakness, fatigue, dizziness lightheadedness with near-syncope. All her symptoms started after her diarrhea that started after she did some stool softener. She denies any other symptoms. She was found to have positive Hemoccult. As well as hemoglobin of 7. Therefore currently she is admitted in the hospital for further workup. Possible etiology include chemotherapy-induced bone marrow suppression which appears more likely as she has inappropriately normal reticulocyte count. Other etiologies include active bleeding secondary to stomatitis, mucositis due to her chemotherapy, other GI bleeding. With this she is currently being admitted in telemetry. I'm giving her 2 units of PRBCs since her hemoglobin has dropped down from 7-6. Monitor H&H after her PRBCs. She may require gastroenterology consultation depending on if she starts having vomiting or diarrhea here in the hospital. Currently I will hold off on further consultation. IV Protonix every 12 hours.  2.Diabetes mellitus type 2. Placing her on sliding scale and reducing her insulin dose.  3.Hypertension. Continue her home medications.  4. Renal cell carcinoma Holding her chemotherapy regimen at present. Also continue her on Decadron. Continue her on OxyIR as needed  DVT Prophylaxis: mechanical compression  device Nutrition:  n.p.o. except medication   Code Status:  full   Family Communication:  son  was present at bedside, opportunity was given to ask question and all questions were answered satisfactorily at the time of interview. Disposition: Admitted to inpatient in telemetry unit.  Author: Berle Mull, MD [facility] Pager: [phone] 03/03/2014, 11:21 PM    If 7PM-7AM,  please contact night-coverage [email or web address]  Password [email or web address]  **Disclaimer: This note may have been dictated with voice recognition software. Similar sounding words can inadvertently be transcribed and this note may contain transcription errors which may not have been corrected upon publication of note.**

## 2014-03-03 NOTE — ED Provider Notes (Signed)
CSN: [id number]     Arrival date & time 03/03/14  1751 History   First MD Initiated Contact with Patient 03/03/14 1808     Chief Complaint  Patient presents with  . Near Syncope  . Diarrhea     (Consider location/radiation/quality/duration/timing/severity/associated sxs/prior Treatment) Patient is a 78 y.o. female presenting with near-syncope and diarrhea. The history is provided by the patient.  Near Syncope This is a new problem. The current episode started 3 to 5 hours ago. The problem occurs constantly. The problem has been gradually improving. Pertinent negatives include no chest pain, no abdominal pain, no headaches and no shortness of breath. The symptoms are aggravated by standing. The symptoms are relieved by rest. She has tried nothing for the symptoms. The treatment provided no relief.  Diarrhea Associated symptoms: no abdominal pain, no fever, no headaches and no vomiting     Past Medical History  Diagnosis Date  . Allergic rhinitis   . Diabetes mellitus     Type II  . Diverticulosis of colon   . GERD (gastroesophageal reflux disease)   . Hyperlipidemia   . Hypertension   . Hx of colonic polyps   . Gout   . Hemorrhoids   . Family history of colon cancer father  . Anemia   . Tachycardia, paroxysmal 09-2010  . Kidney carcinoma    Past Surgical History  Procedure Laterality Date  . Abdominal hysterectomy  1972  . Cesarean section    . Cataract extraction, bilateral  February and March, 2014  . Breast lumpectomy  02/07    Left  ([facility])  Benign  . Breast biopsy  02/08    left- Benign    Family History  Problem Relation Age of Onset  . Kidney disease Mother   . Colon cancer Father   . Heart disease Sister     CVA  . Lung cancer Brother   . Breast cancer      neice   History  Substance Use Topics  . Smoking status: Never Smoker   . Smokeless tobacco: Never Used  . Alcohol Use: No   OB History   Grav Para Term Preterm Abortions TAB SAB Ect Mult Living                  Review of Systems  Constitutional: Negative for fever and fatigue.  HENT: Negative for congestion and drooling.   Eyes: Negative for pain.  Respiratory: Negative for cough and shortness of breath.   Cardiovascular: Positive for near-syncope. Negative for chest pain.  Gastrointestinal: Positive for diarrhea. Negative for nausea, vomiting and abdominal pain.  Genitourinary: Negative for dysuria and hematuria.  Musculoskeletal: Negative for back pain, gait problem and neck pain.  Skin: Negative for color change.  Neurological: Positive for dizziness. Negative for headaches.  Hematological: Negative for adenopathy.  Psychiatric/Behavioral: Negative for behavioral problems.  All other systems reviewed and are negative.     Allergies  Sulfonamide derivatives  Home Medications   Prior to Admission medications   Medication Sig Start Date End Date Taking? Authorizing Provider  amLODipine (NORVASC) 5 MG tablet Take 5 mg by mouth daily.  02/05/14 02/05/15  Historical Provider, MD  bisoprolol (ZEBETA) 5 MG tablet Take 10 mg by mouth daily.  11/30/13   Historical Provider, MD  ciprofloxacin (CIPRO) 250 MG tablet Take 1 tablet (250 mg total) by mouth 2 (two) times daily. 02/13/14   Venia Carbon, MD  dexamethasone (DECADRON) 4 MG tablet  02/18/14  Historical Provider, MD  ibuprofen (ADVIL,MOTRIN) 200 MG tablet Take 400 mg by mouth every 6 (six) hours as needed for moderate pain.    Historical Provider, MD  insulin glargine (LANTUS) 100 UNIT/ML injection Inject 22 Units into the skin at bedtime.     Historical Provider, MD  Lancets Glory Rosebush ULTRASOFT) lancets  09/17/13   Historical Provider, MD  metFORMIN (GLUCOPHAGE) 1000 MG tablet Take 1,000 mg by mouth 2 (two) times daily with a meal.    Historical Provider, MD  ONE TOUCH ULTRA TEST test strip USE ONCE A DAY DX. 250.00    Venia Carbon, MD  oxyCODONE (OXY IR/ROXICODONE) 5 MG immediate release tablet Take 5 mg by mouth every  6 (six) hours as needed.  02/12/14 03/14/14  Historical Provider, MD  pazopanib (VOTRIENT) 200 MG tablet Take 4 tablets (800 mg total) by mouth daily. Take on an empty stomach. 01/15/14   Ladell Pier, MD  prochlorperazine (COMPAZINE) 5 MG tablet Take 1 tablet (5 mg total) by mouth every 6 (six) hours as needed for nausea or vomiting. 12/17/13   Ladell Pier, MD  ramipril (ALTACE) 10 MG capsule Take 10 mg by mouth every morning.    Historical Provider, MD  ranitidine (ZANTAC) 150 MG capsule Take 150 mg by mouth 2 (two) times daily.  02/05/14 02/05/15  Historical Provider, MD   BP 110/51  Pulse 81  Temp(Src) 97.8 F (36.6 C) (Oral)  Resp 16  SpO2 96% Physical Exam  Nursing note and vitals reviewed. Constitutional: She is oriented to person, place, and time. She appears well-developed and well-nourished.  HENT:  Head: Normocephalic and atraumatic.  Mouth/Throat: Oropharynx is clear and moist. No oropharyngeal exudate.  Mild erythema and induration just posterior to the left lower most posterior molar. This area is hemostatic.  Eyes: Conjunctivae and EOM are normal. Pupils are equal, round, and reactive to light.  Neck: Normal range of motion. Neck supple.  Cardiovascular: Normal rate, regular rhythm, normal heart sounds and intact distal pulses.  Exam reveals no gallop and no friction rub.   No murmur heard. Pulmonary/Chest: Effort normal and breath sounds normal. No respiratory distress. She has no wheezes.  Abdominal: Soft. Bowel sounds are normal. There is no tenderness. There is no rebound and no guarding.  Musculoskeletal: Normal range of motion. She exhibits no edema and no tenderness.  Neurological: She is alert and oriented to person, place, and time.  alert, oriented x3 speech: normal in context and clarity memory: intact grossly cranial nerves II-XII: intact motor strength: full proximally and distally no involuntary movements or tremors sensation: intact to light touch  diffusely  cerebellar: finger-to-nose and heel-to-shin intact gait: normal forwards and backwards w/ minimal assistance   Skin: Skin is warm and dry.  Psychiatric: She has a normal mood and affect. Her behavior is normal.    ED Course  Procedures (including critical care time) Labs Review Labs Reviewed  CBC WITH DIFFERENTIAL - Abnormal; Notable for the following:    WBC 24.5 (*)    RBC 2.76 (*)    Hemoglobin 7.0 (*)    HCT 22.3 (*)    MCH 25.4 (*)    RDW 23.3 (*)    Neutrophils Relative % 89 (*)    Lymphocytes Relative 6 (*)    Neutro Abs 21.8 (*)    Monocytes Absolute 1.2 (*)    All other components within normal limits  COMPREHENSIVE METABOLIC PANEL - Abnormal; Notable for the following:  Sodium 132 (*)    Chloride 95 (*)    Glucose, Bld 156 (*)    Creatinine, Ser 0.46 (*)    Total Protein 5.9 (*)    Albumin 1.4 (*)    Alkaline Phosphatase 209 (*)    All other components within normal limits  URINALYSIS, ROUTINE W REFLEX MICROSCOPIC - Abnormal; Notable for the following:    APPearance CLOUDY (*)    Hgb urine dipstick TRACE (*)    Leukocytes, UA MODERATE (*)    All other components within normal limits  URINE MICROSCOPIC-ADD ON - Abnormal; Notable for the following:    Squamous Epithelial / LPF FEW (*)    All other components within normal limits    Imaging Review Dg Chest 2 View  03/03/2014   CLINICAL DATA:  Syncope. History of metastatic renal cell carcinoma.  EXAM: CHEST  2 VIEW  COMPARISON:  CT chest 12/09/2013.  PA and lateral chest 12/22/2013.  FINDINGS: Bilateral pulmonary nodules and masses are seen. Peripheral opacity in the left lower lung zone appears worsened. The appearance of the chest is otherwise not markedly changed. Heart size is normal. No pneumothorax is identified.  IMPRESSION: Pulmonary nodules and masses consistent with metastatic renal cell carcinoma. Peripheral opacity in the left lower lung zone has worsened and likely represents progressive  metastatic disease.   Electronically Signed   By: Inge Rise M.D.   On: 03/03/2014 19:21   Ct Head Wo Contrast  03/03/2014   CLINICAL DATA:  Near syncope. History of metastatic renal cell carcinoma.  EXAM: CT HEAD WITHOUT CONTRAST  TECHNIQUE: Contiguous axial images were obtained from the base of the skull through the vertex without intravenous contrast.  COMPARISON:  None.  FINDINGS: Scattered calvarial metastatic deposits are identified. The largest lesion is in the left parietal bone where there is destruction of both the inner and outer tables. No acute intracranial abnormality including infarct, hemorrhage, mass lesion, mass effect, midline shift or abnormal ex collection is identified there is some cortical atrophy and chronic microvascular ischemic change.  IMPRESSION: No acute intracranial abnormality.  Lytic lesions in the calvarium consistent with metastatic renal cell carcinoma.  Atrophy and chronic microvascular ischemic change.   Electronically Signed   By: Inge Rise M.D.   On: 03/03/2014 20:58     EKG Interpretation   Date/Time:  Monday March 03 2014 19:33:13 EDT Ventricular Rate:  81 PR Interval:  140 QRS Duration: 148 QT Interval:  428 QTC Calculation: 497 R Axis:   -57 Text Interpretation:  Normal sinus rhythm Right bundle branch block Left  anterior fascicular block  Bifascicular block  Left ventricular  hypertrophy with QRS widening Confirmed by Kurt Azimi  MD, Mynor Witkop ([id number]) on  03/03/2014 7:48:46 PM       MDM   Final diagnoses:  Anemia, unspecified  Leukocytosis  Diarrhea  Near syncope  Hypoxia  Occult blood in stools    6:45 PM 78 y.o. female w hx of metastatic renal ca on oral chemo s/p rad to head several days ago who presents with diarrhea and dizziness with standing. She states that she has developed constipation over the last few days since restarting her oral chemotherapy medication. She used MiraLAX and an enema yesterday. She used 2 more  laxatives today and has had 3 large watery bowel movements today. She began feeling some lightheadedness and dizziness upon standing after this. She is afebrile and vital signs are unremarkable here. She appears well on exam. She is a normal  neurologic exam. Will get screening CT of head do to recent radiation but my suspicion is that she is mildly dehydrated from the diarrhea.  Pt's Hgb found to be 7.0. Heme-occult pos w/ grayish stool. Pt does have hx of hemorrhoids. Will plan for admission to hospitalist.     Blanchard Kelch, MD 03/03/14 [phone]

## 2014-03-03 NOTE — ED Notes (Signed)
PER EMS - pt from home with c/o near syncope and diarrhea x3 days, also c/o lower abd "soreness".  Pt with kidney CA, last chemo x4 days ago.  Eating/drinking at baseline. Pt also reports "bleeding tumor" in her jaw, spitting blood.  Witnessed near syncope when attempting to standing.  Denies cp/sob.  1338ml NS, 4mg  Zofran given. A+OX4.

## 2014-03-03 NOTE — ED Notes (Signed)
Patient transported to X-ray 

## 2014-03-03 NOTE — ED Notes (Signed)
MD at bedside. EDP HARRISON PRESENT 

## 2014-03-04 DIAGNOSIS — D649 Anemia, unspecified: Secondary | ICD-10-CM

## 2014-03-04 DIAGNOSIS — C7931 Secondary malignant neoplasm of brain: Secondary | ICD-10-CM

## 2014-03-04 DIAGNOSIS — M7989 Other specified soft tissue disorders: Secondary | ICD-10-CM

## 2014-03-04 DIAGNOSIS — C649 Malignant neoplasm of unspecified kidney, except renal pelvis: Secondary | ICD-10-CM

## 2014-03-04 DIAGNOSIS — R195 Other fecal abnormalities: Secondary | ICD-10-CM

## 2014-03-04 DIAGNOSIS — C7949 Secondary malignant neoplasm of other parts of nervous system: Secondary | ICD-10-CM

## 2014-03-04 LAB — CBC WITH DIFFERENTIAL/PLATELET
BASOS ABS: 0 10*3/uL (ref 0.0–0.1)
BASOS PCT: 0 % (ref 0–1)
Eosinophils Absolute: 0 10*3/uL (ref 0.0–0.7)
Eosinophils Relative: 0 % (ref 0–5)
HCT: 26 % — ABNORMAL LOW (ref 36.0–46.0)
Hemoglobin: 8.5 g/dL — ABNORMAL LOW (ref 12.0–15.0)
LYMPHS PCT: 7 % — AB (ref 12–46)
Lymphs Abs: 1.2 10*3/uL (ref 0.7–4.0)
MCH: 26.2 pg (ref 26.0–34.0)
MCHC: 32.7 g/dL (ref 30.0–36.0)
MCV: 80.2 fL (ref 78.0–100.0)
MONO ABS: 1.3 10*3/uL — AB (ref 0.1–1.0)
Monocytes Relative: 8 % (ref 3–12)
NEUTROS ABS: 14.9 10*3/uL — AB (ref 1.7–7.7)
NEUTROS PCT: 85 % — AB (ref 43–77)
Platelets: 285 10*3/uL (ref 150–400)
RBC: 3.24 MIL/uL — ABNORMAL LOW (ref 3.87–5.11)
RDW: 19.3 % — AB (ref 11.5–15.5)
WBC: 17.4 10*3/uL — ABNORMAL HIGH (ref 4.0–10.5)

## 2014-03-04 LAB — LACTATE DEHYDROGENASE: LDH: 95 U/L (ref 94–250)

## 2014-03-04 LAB — COMPREHENSIVE METABOLIC PANEL
ALT: 21 U/L (ref 0–35)
ANION GAP: 12 (ref 5–15)
AST: 14 U/L (ref 0–37)
Albumin: 1.5 g/dL — ABNORMAL LOW (ref 3.5–5.2)
Alkaline Phosphatase: 164 U/L — ABNORMAL HIGH (ref 39–117)
BUN: 30 mg/dL — AB (ref 6–23)
CO2: 22 meq/L (ref 19–32)
CREATININE: 0.4 mg/dL — AB (ref 0.50–1.10)
Calcium: 8.5 mg/dL (ref 8.4–10.5)
Chloride: 99 mEq/L (ref 96–112)
GFR calc Af Amer: >90 mL/min (ref >90)
Glucose, Bld: 108 mg/dL — ABNORMAL HIGH (ref 70–99)
Potassium: 4 mEq/L (ref 3.7–5.3)
Sodium: 133 mEq/L — ABNORMAL LOW (ref 137–147)
Total Bilirubin: 1.1 mg/dL (ref 0.3–1.2)
Total Protein: 5.5 g/dL — ABNORMAL LOW (ref 6.0–8.3)

## 2014-03-04 LAB — HEMOGLOBIN AND HEMATOCRIT, BLOOD
HCT: 18.8 % — ABNORMAL LOW (ref 36.0–46.0)
Hemoglobin: 6.1 g/dL — CL (ref 12.0–15.0)

## 2014-03-04 LAB — HAPTOGLOBIN: HAPTOGLOBIN: 369 mg/dL — AB (ref 45–215)

## 2014-03-04 LAB — RETICULOCYTES
RBC.: 2.36 MIL/uL — AB (ref 3.87–5.11)
RETIC CT PCT: 1.9 % (ref 0.4–3.1)
Retic Count, Absolute: 44.8 10*3/uL (ref 19.0–186.0)

## 2014-03-04 LAB — PROTIME-INR
INR: 1.42 (ref 0.00–1.49)
Prothrombin Time: 17.4 seconds — ABNORMAL HIGH (ref 11.6–15.2)

## 2014-03-04 LAB — GLUCOSE, CAPILLARY: GLUCOSE-CAPILLARY: 136 mg/dL — AB (ref 70–99)

## 2014-03-04 LAB — PREPARE RBC (CROSSMATCH)

## 2014-03-04 LAB — APTT: aPTT: 37 seconds (ref 24–37)

## 2014-03-04 MED ORDER — INSULIN ASPART 100 UNIT/ML ~~LOC~~ SOLN: 0.0000 [IU] | Freq: Four times a day (QID) | SUBCUTANEOUS | Status: DC

## 2014-03-04 NOTE — Progress Notes (Signed)
*  PRELIMINARY RESULTS* Vascular Ultrasound Lower extremity venous duplex has been completed.  Preliminary findings: no evidence of DVT or baker's cyst.  Landry Mellow, RDMS, RVT  03/04/2014, 10:41 AM

## 2014-03-04 NOTE — Consult Note (Signed)
Referring Provider: No ref. provider found Primary Care Physician:  Viviana Simpler, MD Primary Gastroenterologist:  Dr. Henrene Pastor  Reason for Consultation:  Anemia  HPI: Angelica Murphy is a 78 y.o. female with past medical history of diabetes mellitus, GERD, hypertension, metastatic renal cell carcinoma with metastasis to brain, skull, lungs.  Patient presented with dizziness and lightheadedness preceded by diarrhea. She mentions that she was recently admitted with confusion and was found to have CNS metastasis of her renal cell cancer (was seen at [facility] recently). She was started on Decadron and has undergone one cycle of radiation to her brain.  She was also recently restarted back on chemo (Pazopanib) on 7/25.   For 3 days prior to admission the patient had constipation so she used some stool softener and an enema without any benefit. She took some more stool softener with which she had 2-3 episodes of diarrhea. After that she started having complaints of dizziness and lightheadedness. When she went for a bowel movement again she had hard time walking back to the room because of dizziness and generalized weakness.  She was brought to the ED and was found to have a Hgb of 7.0 grams; repeat again yesterday was down to 6.1 grams.  Hgb on 6/23 was 11.1 grams.  She admits to seeing occasional small amounts of bright red blood from her hemorrhoids, but denies any bloody or black stools.  Family corroborates her story.  She denies abdominal pain.  She was given two units of PRBC's with increase in Hgb to 8.5 grams this afternoon.  She was seen by our practice in 1/012 for IDA and underwent full GI evaluation at that time.  EGD and WCE were both normal.  Colonoscopy revealed moderate diverticulosis in the sigmoid colon as well as hemorrhoids.   Past Medical History  Diagnosis Date  . Allergic rhinitis   . Diabetes mellitus     Type II  . Diverticulosis of colon   . GERD (gastroesophageal reflux  disease)   . Hyperlipidemia   . Hypertension   . Hx of colonic polyps   . Gout   . Hemorrhoids   . Family history of colon cancer father  . Anemia   . Tachycardia, paroxysmal 09-2010  . Kidney carcinoma     Past Surgical History  Procedure Laterality Date  . Abdominal hysterectomy  1972  . Cesarean section    . Cataract extraction, bilateral  February and March, 2014  . Breast lumpectomy  02/07    Left  ([place])  Benign  . Breast biopsy  02/08    left- Benign     Prior to Admission medications   Medication Sig Start Date End Date Taking? Authorizing Provider  amLODipine (NORVASC) 5 MG tablet Take 5 mg by mouth daily.  02/05/14 02/05/15 Yes Historical Provider, MD  bisoprolol (ZEBETA) 5 MG tablet Take 10 mg by mouth daily.  11/30/13  Yes Historical Provider, MD  dexamethasone (DECADRON) 4 MG tablet Take 4 mg by mouth daily.  02/18/14  Yes Historical Provider, MD  ibuprofen (ADVIL,MOTRIN) 200 MG tablet Take 400 mg by mouth every 6 (six) hours as needed for moderate pain.   Yes Historical Provider, MD  insulin glargine (LANTUS) 100 UNIT/ML injection Inject 24 Units into the skin at bedtime.    Yes Historical Provider, MD  Lancets (ONETOUCH ULTRASOFT) lancets  09/17/13  Yes Historical Provider, MD  metFORMIN (GLUCOPHAGE) 1000 MG tablet Take 1,000 mg by mouth 2 (two) times daily with a  meal.   Yes Historical Provider, MD  ONE TOUCH ULTRA TEST test strip USE ONCE A DAY DX. 250.00   Yes Venia Carbon, MD  oxyCODONE (OXY IR/ROXICODONE) 5 MG immediate release tablet Take 5 mg by mouth every 6 (six) hours as needed.  02/12/14 03/14/14 Yes Historical Provider, MD  pazopanib (VOTRIENT) 200 MG tablet Take 600 mg by mouth daily. Take 3 tablets Take on an empty stomach.   Yes Historical Provider, MD  prochlorperazine (COMPAZINE) 5 MG tablet Take 1 tablet (5 mg total) by mouth every 6 (six) hours as needed for nausea or vomiting. 12/17/13  Yes Ladell Pier, MD  ramipril (ALTACE) 10 MG capsule Take 10 mg  by mouth every morning.   Yes Historical Provider, MD  ranitidine (ZANTAC) 150 MG capsule Take 150 mg by mouth 2 (two) times daily.  02/05/14 02/05/15 Yes Historical Provider, MD    Current Facility-Administered Medications  Medication Dose Route Frequency Provider Last Rate Last Dose  . 0.9 %  sodium chloride infusion   Intravenous Continuous Berle Mull, MD 100 mL/hr at 03/04/14 1000    . amLODipine (NORVASC) tablet 5 mg  5 mg Oral Daily Berle Mull, MD   5 mg at 03/04/14 0900  . antiseptic oral rinse (BIOTENE) solution 15 mL  15 mL Mouth Rinse q12n4p Berle Mull, MD   15 mL at 03/04/14 1200  . bisoprolol (ZEBETA) tablet 10 mg  10 mg Oral Daily Berle Mull, MD   10 mg at 03/04/14 0900  . chlorhexidine (PERIDEX) 0.12 % solution 15 mL  15 mL Mouth Rinse BID Berle Mull, MD   15 mL at 03/04/14 0900  . dexamethasone (DECADRON) tablet 4 mg  4 mg Oral Daily Berle Mull, MD   4 mg at 03/04/14 0900  . insulin aspart (novoLOG) injection 0-9 Units  0-9 Units Subcutaneous Q6H Nishant Dhungel, MD      . insulin glargine (LANTUS) injection 10 Units  10 Units Subcutaneous QHS Berle Mull, MD   10 Units at 03/04/14 0021  . ondansetron (ZOFRAN) tablet 4 mg  4 mg Oral Q6H PRN Berle Mull, MD       Or  . ondansetron (ZOFRAN) 8 mg/NS 50 ml IVPB  8 mg Intravenous Q6H PRN Berle Mull, MD      . oxyCODONE (Oxy IR/ROXICODONE) immediate release tablet 5 mg  5 mg Oral Q6H PRN Berle Mull, MD      . pantoprazole (PROTONIX) injection 40 mg  40 mg Intravenous Q12H Berle Mull, MD   40 mg at 03/04/14 1236  . prochlorperazine (COMPAZINE) tablet 5 mg  5 mg Oral Q6H PRN Berle Mull, MD      . sodium chloride 0.9 % injection 3 mL  3 mL Intravenous Q12H Berle Mull, MD   3 mL at 03/04/14 0021    Allergies as of 03/03/2014 - Review Complete 03/03/2014  Allergen Reaction Noted  . Sulfonamide derivatives  10/16/2006    Family History  Problem Relation Age of Onset  . Kidney disease Mother   . Colon cancer  Father   . Heart disease Sister     CVA  . Lung cancer Brother   . Breast cancer      neice    History   Social History  . Marital Status: Married    Spouse Name: N/A    Number of Children: 3  . Years of Education: N/A   Occupational History  . 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  Volunteers at [employer] Topics  . Smoking status: Never Smoker   . Smokeless tobacco: Never Used  . Alcohol Use: No  . Drug Use: No  . Sexual Activity: Not on file   Other Topics Concern  . Not on file   Social History Narrative   Has old living will   No formal health care POA-- but requests son, Haywood Lasso   Would accept resuscitation but no prolonged ventilation   No feeding tube if cognitively unaware    Review of Systems: Ten point ROS is O/W negative except as mentioned in HPI.  Physical Exam: Vital signs in last 24 hours: Temp:  [97 F (36.1 C)-98.4 F (36.9 C)] 97.8 F (36.6 C) (07/28 0940) Pulse Rate:  [81-95] 92 (07/28 0940) Resp:  [16-24] 18 (07/28 0940) BP: (108-123)/(50-65) 120/60 mmHg (07/28 0940) SpO2:  [90 %-100 %] 100 % (07/28 0940) Weight:  [116 lb 3.2 oz (52.708 kg)-116 lb 6.4 oz (52.799 kg)] 116 lb 3.2 oz (52.708 kg) (07/28 0405) Last BM Date: 03/03/14 General:  Alert, Well-developed, well-nourished, pleasant and cooperative in NAD Head:  Normocephalic and atraumatic. Eyes:  Sclera clear, no icterus.  Conjunctiva pink. Ears:  Normal auditory acuity. Mouth:  No deformity or lesions.   Lungs:  Clear throughout to auscultation.  No wheezes, crackles, or rhonchi.  Heart:  Regular rate and rhythm; no murmurs, clicks, rubs, or gallops. Abdomen:  Soft, non-distended.  BS present.  Non-tender.  Rectal:  Large hemorrhoid noted.  DRE did not reveal any masses.  Small amount of light brown stool seen on exam glove but was trace heme positive. Msk:  Symmetrical without gross deformities. Pulses:  Normal pulses noted. Extremities:  Without  clubbing or edema. Neurologic:  Alert and  oriented x4;  grossly normal neurologically. Skin:  Intact without significant lesions or rashes. Psych:  Alert and cooperative. Normal mood and affect.  Intake/Output from previous day: 07/27 0701 - 07/28 0700 In: 1412.5 [I.V.:1000; Blood:412.5] Out: 500 [Urine:500] Intake/Output this shift: Total I/O In: -  Out: 500 [Urine:500]  Lab Results:  Recent Labs  03/03/14 1931 03/03/14 2355 03/04/14 1227  WBC 24.5*  --  17.4*  HGB 7.0* 6.1* 8.5*  HCT 22.3* 18.8* 26.0*  PLT 378  --  285   BMET  Recent Labs  03/03/14 1931 03/04/14 1227  NA 132* 133*  K 4.4 4.0  CL 95* 99  CO2 22 22  GLUCOSE 156* 108*  BUN 13 30*  CREATININE 0.46* 0.40*  CALCIUM 8.5 8.5   LFT  Recent Labs  03/04/14 1227  PROT 5.5*  ALBUMIN 1.5*  AST 14  ALT 21  ALKPHOS 164*  BILITOT 1.1   PT/INR  Recent Labs  03/03/14 2355  LABPROT 17.4*  INR 1.42   Studies/Results: Dg Chest 2 View  03/03/2014   CLINICAL DATA:  Syncope. History of metastatic renal cell carcinoma.  EXAM: CHEST  2 VIEW  COMPARISON:  CT chest 12/09/2013.  PA and lateral chest 12/22/2013.  FINDINGS: Bilateral pulmonary nodules and masses are seen. Peripheral opacity in the left lower lung zone appears worsened. The appearance of the chest is otherwise not markedly changed. Heart size is normal. No pneumothorax is identified.  IMPRESSION: Pulmonary nodules and masses consistent with metastatic renal cell carcinoma. Peripheral opacity in the left lower lung zone has worsened and likely represents progressive metastatic disease.   Electronically Signed   By: Inge Rise M.D.   On: 03/03/2014 19:21  Ct Head Wo Contrast  03/03/2014   CLINICAL DATA:  Near syncope. History of metastatic renal cell carcinoma.  EXAM: CT HEAD WITHOUT CONTRAST  TECHNIQUE: Contiguous axial images were obtained from the base of the skull through the vertex without intravenous contrast.  COMPARISON:  None.   FINDINGS: Scattered calvarial metastatic deposits are identified. The largest lesion is in the left parietal bone where there is destruction of both the inner and outer tables. No acute intracranial abnormality including infarct, hemorrhage, mass lesion, mass effect, midline shift or abnormal ex collection is identified there is some cortical atrophy and chronic microvascular ischemic change.  IMPRESSION: No acute intracranial abnormality.  Lytic lesions in the calvarium consistent with metastatic renal cell carcinoma.  Atrophy and chronic microvascular ischemic change.   Electronically Signed   By: Inge Rise M.D.   On: 03/03/2014 20:58    IMPRESSION:  -Anemia:  Acute on chronic (had been seen by GI for IDA with full evaluation negative for GI blood loss in 2012).  Recently with large drop in Hgb and no overt GI bleeding.  For her Hgb to drop that much she should have seen some obvious blood from a GI standpoint.  Her stool was light brown on exam, but trace heme positive.  She does have a large hemorrhoid noted as well.  Suspect that the large drop was due to new chemo that was initiated recently.   -Metastatic RCC with new chemo initiated on 7/25.  PLAN: -Could consider CT scan to rule out RP bleed with large decrease in Hgb. -Will discuss need for any endoscopy evaluation with my attending. -Monitor Hgb and transfuse prn.  ZEHR, JESSICA D.  03/04/2014, 2:26 PM  Pager number [phone]      Attending physician's note   I have taken a history, examined the patient and reviewed the chart. I agree with the Advanced Practitioner's note, impression and recommendations. Acute on chronic anemia with trace heme + brown stool. No evidence of active GI bleeding to explain acute Hb drop. Heme + stool likely from large hemorrhoid. Suspect chemotherapy induced anemia. No need for endoscopic studies at this time. Transfuse as indicated-defer decision to primary service and oncologist. GI signing off.  Outpatient GI follow up with Dr. Scarlette Shorts as needed.  Ladene Artist, MD [facility]

## 2014-03-04 NOTE — Progress Notes (Signed)
CRITICAL VALUE ALERT  Critical value received:  Hgb- 6.1  Date of notification:  03/04/14  Time of notification:  0029  Critical value read back:Yes.    Nurse who received alert:  Donne Anon  MD notified (1st page):  Hopspitalist  Time of first page:  0030

## 2014-03-04 NOTE — Discharge Instructions (Signed)
Anemia, Nonspecific Anemia is a condition in which the concentration of red blood cells or hemoglobin in the blood is below normal. Hemoglobin is a substance in red blood cells that carries oxygen to the tissues of the body. Anemia results in not enough oxygen reaching these tissues.  CAUSES  Common causes of anemia include:   Excessive bleeding. Bleeding may be internal or external. This includes excessive bleeding from periods (in women) or from the intestine.   Poor nutrition.   Chronic kidney, thyroid, and liver disease.  Bone marrow disorders that decrease red blood cell production.  Cancer and treatments for cancer.  HIV, AIDS, and their treatments.  Spleen problems that increase red blood cell destruction.  Blood disorders.  Excess destruction of red blood cells due to infection, medicines, and autoimmune disorders. SIGNS AND SYMPTOMS   Minor weakness.   Dizziness.   Headache.  Palpitations.   Shortness of breath, especially with exercise.   Paleness.  Cold sensitivity.  Indigestion.  Nausea.  Difficulty sleeping.  Difficulty concentrating. Symptoms may occur suddenly or they may develop slowly.  DIAGNOSIS  Additional blood tests are often needed. These help your health care provider determine the best treatment. Your health care provider will check your stool for blood and look for other causes of blood loss.  TREATMENT  Treatment varies depending on the cause of the anemia. Treatment can include:   Supplements of iron, vitamin B12, or folic acid.   Hormone medicines.   A blood transfusion. This may be needed if blood loss is severe.   Hospitalization. This may be needed if there is significant continual blood loss.   Dietary changes.  Spleen removal. HOME CARE INSTRUCTIONS Keep all follow-up appointments. It often takes many weeks to correct anemia, and having your health care provider check on your condition and your response to  treatment is very important. SEEK IMMEDIATE MEDICAL CARE IF:   You develop extreme weakness, shortness of breath, or chest pain.   You become dizzy or have trouble concentrating.  You develop heavy vaginal bleeding.   You develop a rash.   You have bloody or black, tarry stools.   You faint.   You vomit up blood.   You vomit repeatedly.   You have abdominal pain.  You have a fever or persistent symptoms for more than 2-3 days.   You have a fever and your symptoms suddenly get worse.   You are dehydrated.  MAKE SURE YOU:  Understand these instructions.  Will watch your condition.  Will get help right away if you are not doing well or get worse. Document Released: 09/01/2004 Document Revised: 03/27/2013 Document Reviewed: 01/18/2013 Exit[employer] Patient Information 2015 [employer]. This information is not intended to replace advice given to you by your health care provider. Make sure you discuss any questions you have with your health care provider.  

## 2014-03-04 NOTE — Progress Notes (Signed)
Utilization review completed.  

## 2014-03-04 NOTE — Discharge Summary (Addendum)
Physician Discharge Summary  Angelica Murphy FTD:[id number] DOB: Aug 09, 1935 DOA: 03/03/2014  PCP: Viviana Simpler, MD  [facility] oncologist: Dr. Siri Cole  Admit date: 03/03/2014 Discharge date: 03/04/2014  Time spent: 35 minutes  Recommendations for Outpatient Follow-up:  1. Discharge home with outpatient PCP followup in one week. Repeat H&H  2.  patient has followup with her oncologist Dr. 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 She had an episode of diarrhea prior to that. Patient reports being admitted to the hospital recently with confusion and found to have brain metastases from renal cell cancer. She was started on Decadron and underwent 1 cycle of radiation to her brain. She was also recently started back on Pazopanib.  Patient reported being constipated last 3 days and took stool softeners for by an enema without much relief. She took extra stool softeners and had 2-3 episodes of diarrhea after which she started having dizziness and lightheadedness. Also had an episode of vomiting prior to coming to the ED. In the ED her vitals were stable but was found to have hemoglobin of 7 drop from admission hemoglobin of 9.7. Prior to that about 2 months back her hemoglobin was 11 gm.  Subsequent hemoglobin dropped to 6.1. She also reports seeing occasional bright blood from her hemorrhoids but denied any dark stools.  She was found to have heme-positive stool.  Patient admitted to telemetry and ordered 2 units  PRBC.  Hospital Course:  Symptomatic anemia possibly from myelosuppression Patient received PRBC and hemoglobin improved appropriately from 6.1 to 8.5. Patient's symptoms of dizziness and lightheadedness has resolved. Patient seen by GI and recommended no further inpatient GI workup. Her significant drop in H&H could be from  Myelosuppression.  Patient had workup for iron deficiency anemia in 2012 with a negative capsule endoscopy. She is clinically stable and has appointment with her oncologist in 2 days. She should follow up with her PCP in one week and have her hemoglobin rechecked. -Would recommend to avoid NSAIDs -Patient and family instructed to return to ED if she has any signs of GI bleed or worsening symptoms of anemia.  Type 2 diabetes mellitus resume home medications  Hypertension Resume home medications  Renal cell adenoma with brain metastases Continue Decadron. Has leukocytosis likely secondary to this. Followup with oncology as outpatient  Patient is clinically stable for discharge home with outpatient followup.   Procedures:  none  Consultations:  [facility]  Discharge Exam: Filed Vitals:   03/04/14 1500  BP: 110/55  Pulse: 81  Temp: 97.8 F (36.6 C)  Resp: 18    General: Elderly thin built female in no acute distress HEENT: Pallor present, moist oral mucosa Cardiovascular: Normal S1-S2, no murmurs Chest: Clear to auscultation bilaterally no added sounds Abdomen: Soft, nontender,  nondistended, bowel sounds present Extremities: Warm, no edema CNS: Alert and oriented   Discharge Instructions You were cared for by a hospitalist during your hospital stay. If you have any questions about your discharge medications or the care  you received while you were in the hospital after you are discharged, you can call the unit and asked to speak with the hospitalist on call if the hospitalist that took care of you is not available. Once you are discharged, your primary care physician will handle any further medical issues. Please note that NO REFILLS for any discharge medications will be authorized once you are discharged, as it is imperative that you return to your primary care physician (or establish a relationship with a primary care physician if you do not have one) for your aftercare needs so that they can reassess your need for medications and monitor your lab values.     Medication List    STOP taking these medications       ibuprofen 200 MG tablet  Commonly known as:  ADVIL,MOTRIN      TAKE these medications       amLODipine 5 MG tablet  Commonly known as:  NORVASC  Take 5 mg by mouth daily.     bisoprolol 5 MG tablet  Commonly known as:  ZEBETA  Take 10 mg by mouth daily.     dexamethasone 4 MG tablet  Commonly known as:  DECADRON  Take 4 mg by mouth daily.     insulin glargine 100 UNIT/ML injection  Commonly known as:  LANTUS  Inject 24 Units into the skin at bedtime.     metFORMIN 1000 MG tablet  Commonly known as:  GLUCOPHAGE  Take 1,000 mg by mouth 2 (two) times daily with a meal.     ONE TOUCH ULTRA TEST test strip  Generic drug:  glucose blood  USE ONCE A DAY DX. 250.00     onetouch ultrasoft lancets     oxyCODONE 5 MG immediate release tablet  Commonly known as:  Oxy IR/ROXICODONE  Take 5 mg by mouth every 6 (six) hours as needed.     pazopanib 200 MG tablet  Commonly known as:  VOTRIENT  Take 600 mg by mouth daily. Take 3 tablets Take on an empty stomach.     prochlorperazine 5 MG tablet  Commonly known as:  COMPAZINE  Take 1 tablet (5 mg total) by mouth every 6 (six) hours as needed for nausea or vomiting.     ramipril 10 MG capsule  Commonly known as:  ALTACE  Take 10 mg by  mouth every morning.     ranitidine 150 MG capsule  Commonly known as:  ZANTAC  Take 150 mg by mouth 2 (two) times daily.       Allergies  Allergen Reactions  . Sulfonamide Derivatives     REACTION: eyes red and blurry       Follow-up Information   Follow up with Viviana Simpler, MD. Schedule an appointment as soon as possible for a visit in 1 week.   Specialties:  Internal Medicine, Pediatrics   Contact information:   [place] [id number] [phone]       Follow up with Siri Cole R On 03/06/2014.   Specialty:  Internal Medicine   Contact information:   [facility]  [id number] [phone]        The results of significant diagnostics from this hospitalization (including  imaging, microbiology, ancillary and laboratory) are listed below for reference.    Significant Diagnostic Studies: Dg Chest 2 View  03/03/2014   CLINICAL DATA:  Syncope. History of metastatic renal cell carcinoma.  EXAM: CHEST  2 VIEW  COMPARISON:  CT chest 12/09/2013.  PA and lateral chest 12/22/2013.  FINDINGS: Bilateral pulmonary nodules and masses are seen. Peripheral opacity in the left lower lung zone appears worsened. The appearance of the chest is otherwise not markedly changed. Heart size is normal. No pneumothorax is identified.  IMPRESSION: Pulmonary nodules and masses consistent with metastatic renal cell carcinoma. Peripheral opacity in the left lower lung zone has worsened and likely represents progressive metastatic disease.   Electronically Signed   By: Inge Rise M.D.   On: 03/03/2014 19:21   Ct Head Wo Contrast  03/03/2014   CLINICAL DATA:  Near syncope. History of metastatic renal cell carcinoma.  EXAM: CT HEAD WITHOUT CONTRAST  TECHNIQUE: Contiguous axial images were obtained from the base of the skull through the vertex without intravenous contrast.  COMPARISON:  None.  FINDINGS: Scattered calvarial metastatic deposits  are identified. The largest lesion is in the left parietal bone where there is destruction of both the inner and outer tables. No acute intracranial abnormality including infarct, hemorrhage, mass lesion, mass effect, midline shift or abnormal ex collection is identified there is some cortical atrophy and chronic microvascular ischemic change.  IMPRESSION: No acute intracranial abnormality.  Lytic lesions in the calvarium consistent with metastatic renal cell carcinoma.  Atrophy and chronic microvascular ischemic change.   Electronically Signed   By: Inge Rise M.D.   On: 03/03/2014 20:58    Microbiology: No results found for this or any previous visit (from the past 240 hour(s)).   Labs: Basic Metabolic Panel:  Recent Labs Lab 03/03/14 1931 03/04/14 1227  NA 132* 133*  K 4.4 4.0  CL 95* 99  CO2 22 22  GLUCOSE 156* 108*  BUN 13 30*  CREATININE 0.46* 0.40*  CALCIUM 8.5 8.5   Liver Function Tests:  Recent Labs Lab 03/03/14 1931 03/04/14 1227  AST 34 14  ALT 25 21  ALKPHOS 209* 164*  BILITOT 0.6 1.1  PROT 5.9* 5.5*  ALBUMIN 1.4* 1.5*   No results found for this basename: LIPASE, AMYLASE,  in the last 168 hours No results found for this basename: AMMONIA,  in the last 168 hours CBC:  Recent Labs Lab 03/03/14 1931 03/03/14 2355 03/04/14 1227  WBC 24.5*  --  17.4*  NEUTROABS 21.8*  --  14.9*  HGB 7.0* 6.1* 8.5*  HCT 22.3* 18.8* 26.0*  MCV 80.8  --  80.2  PLT 378  --  285   Cardiac Enzymes: No results found for this basename: CKTOTAL, CKMB, CKMBINDEX, TROPONINI,  in the last 168 hours BNP: BNP (last 3 results) No results found for this basename: PROBNP,  in the last 8760 hours CBG:  Recent Labs Lab 03/03/14 2333 03/04/14 0549  GLUCAP 161* 136*       Signed:  Jubilee Vivero  [facility] 03/04/2014, 3:23 PM

## 2014-03-05 LAB — TYPE AND SCREEN
ABO/RH(D): O POS
ANTIBODY SCREEN: NEGATIVE
UNIT DIVISION: 0
Unit division: 0

## 2014-03-10 ENCOUNTER — Telehealth: Payer: Self-pay | Admitting: Internal Medicine

## 2014-03-10 NOTE — Telephone Encounter (Signed)
Daughter called to let you know Ms Rabelo passed away

## 2014-03-10 NOTE — Telephone Encounter (Signed)
Called and spoke with the son to let the family know you was out of the office until Wednesday and that you would be giving them a call.

## 2014-03-11 NOTE — Telephone Encounter (Signed)
I spoke to Levada Dy to offer my condolences

## 2014-04-08 DEATH — deceased

## 2014-06-09 ENCOUNTER — Encounter: Payer: Self-pay | Admitting: Internal Medicine

## 2014-09-13 IMAGING — CR DG CHEST 2V
2 series · 2 of 2 positions shown · non-contrast
Comparison: Chest x-ray December 09, 2013

CLINICAL DATA: Cough, shortness of breath. Known pulmonary
metastasis.

EXAM:
CHEST  2 VIEW

[w chest pa]
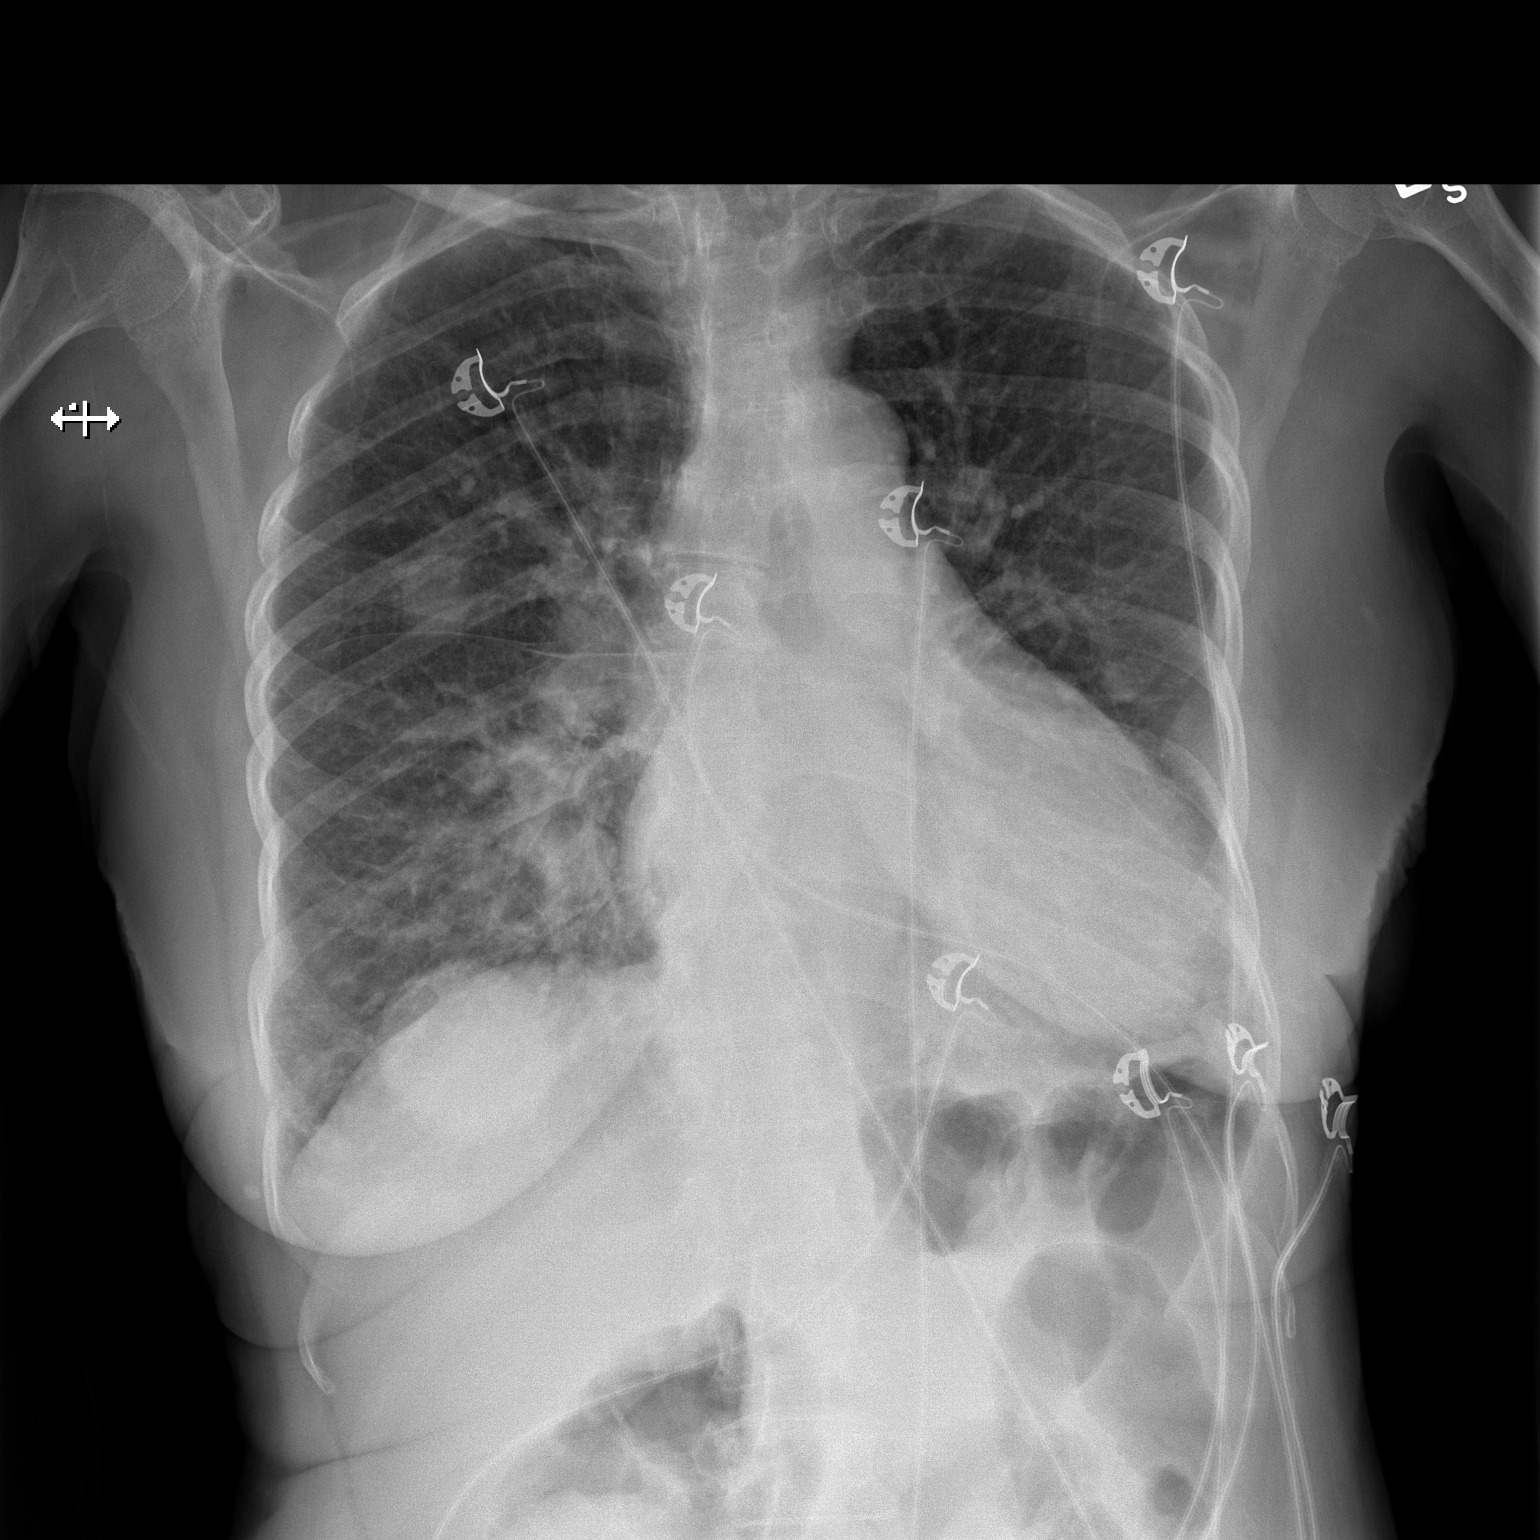

[w chest lat]
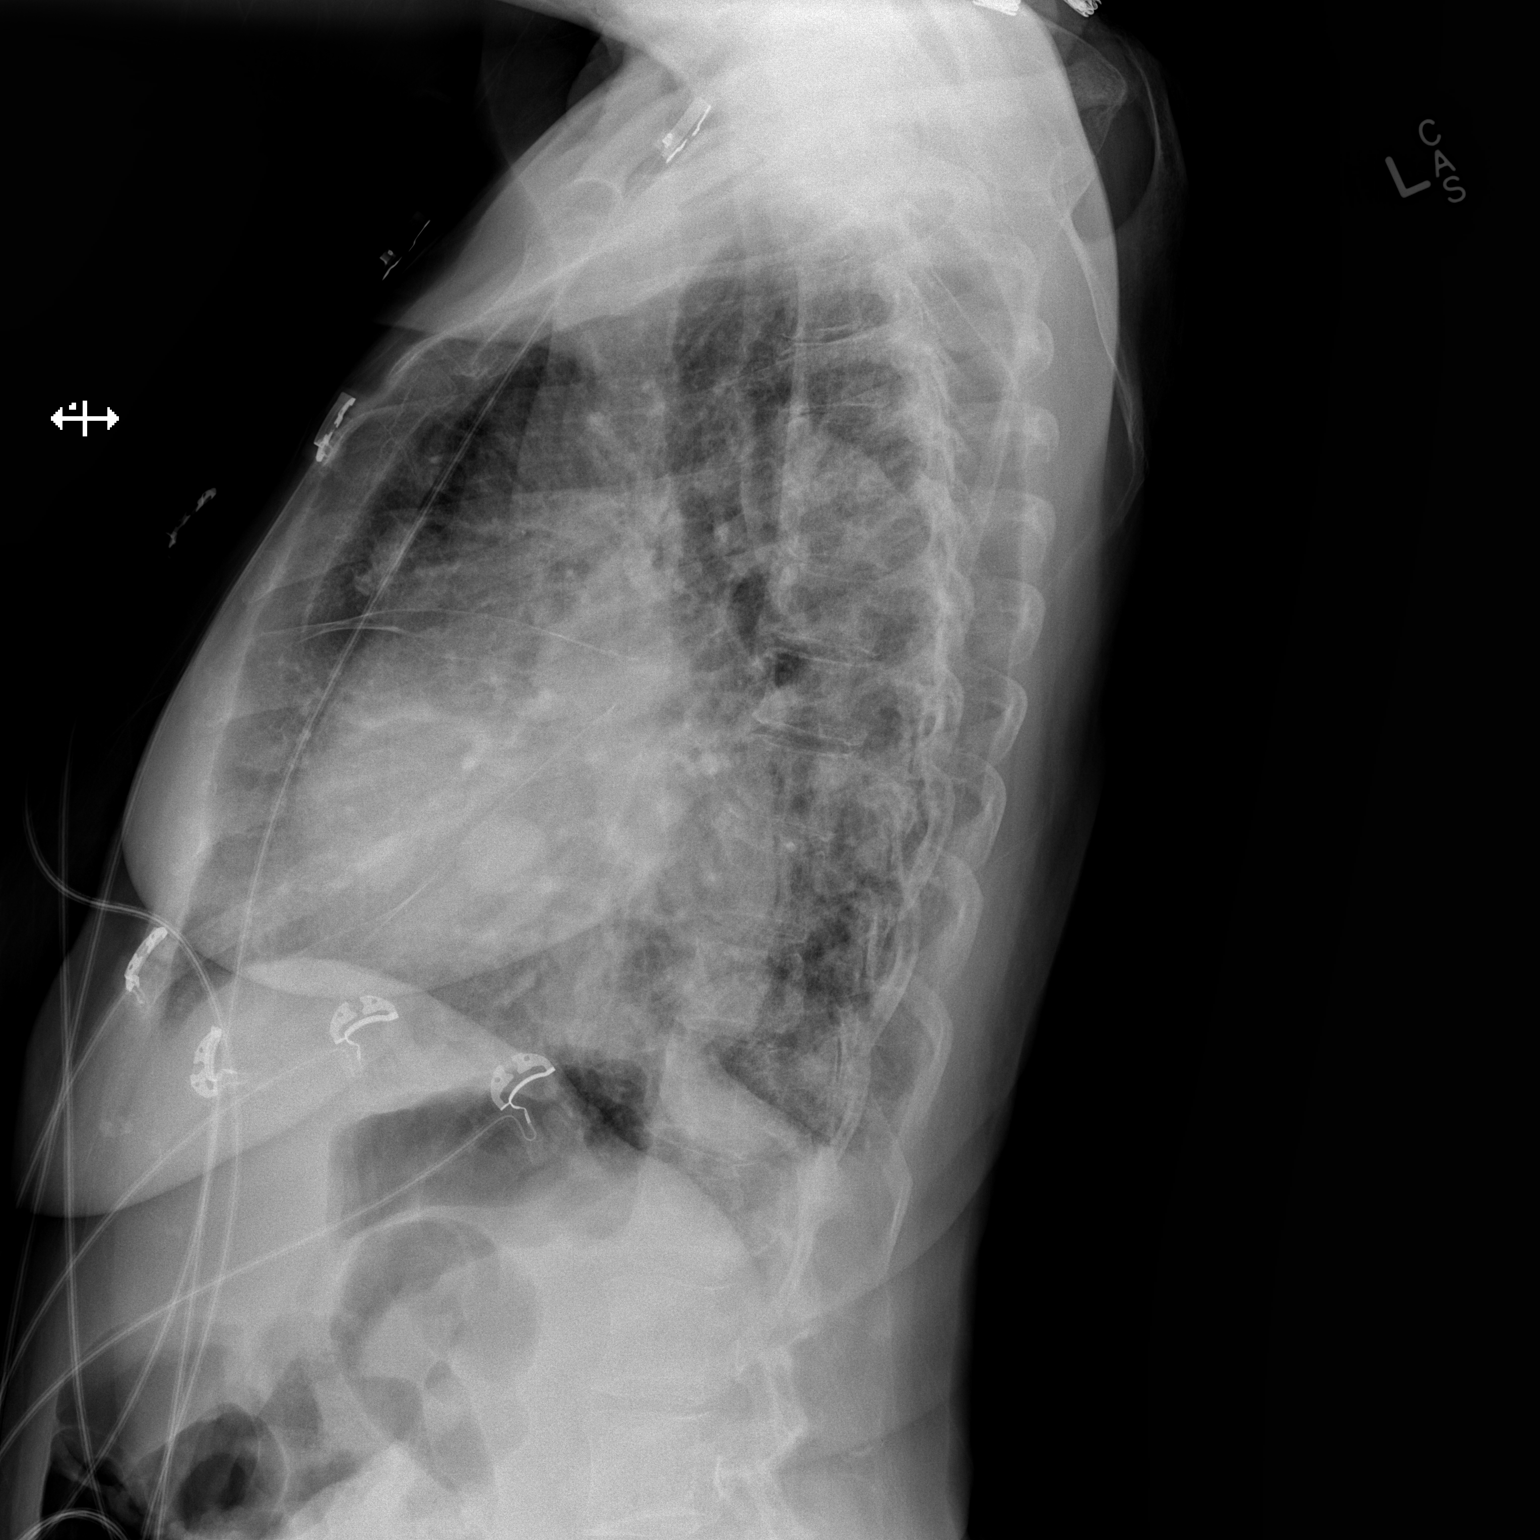

[2 of 2 positions shown; findings below may reference images not displayed]

FINDINGS: The heart size and mediastinal contours are stable. The heart size
is enlarged. Multiple pulmonary metastasis are identified not
changed. There is no focal pneumonia of pleural effusion or
pulmonary edema. The visualized skeletal structures are stable.
IMPRESSION: Stable pulmonary metastasis.  No acute abnormality identified.

## 2014-11-23 IMAGING — CT CT HEAD W/O CM
2 series · 16 of 30 positions shown, 20 images · non-contrast
Comparison: None.

CLINICAL DATA: Near syncope. History of metastatic renal cell
carcinoma.

EXAM:
CT HEAD WITHOUT CONTRAST
TECHNIQUE: Contiguous axial images were obtained from the base of the skull
through the vertex without intravenous contrast.

[Series 2: head w/o · axial · non-contrast · 0.45mm/px · z∈[+1397,+1527]mm · 13 of 32 slices shown, 17 images]
[im 3/32  brain]
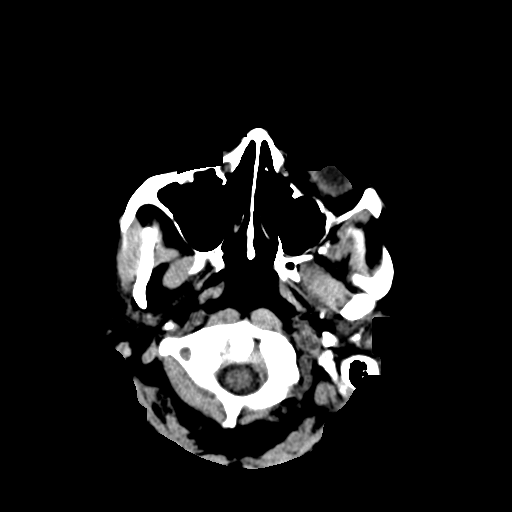
[im 3/32  bone]
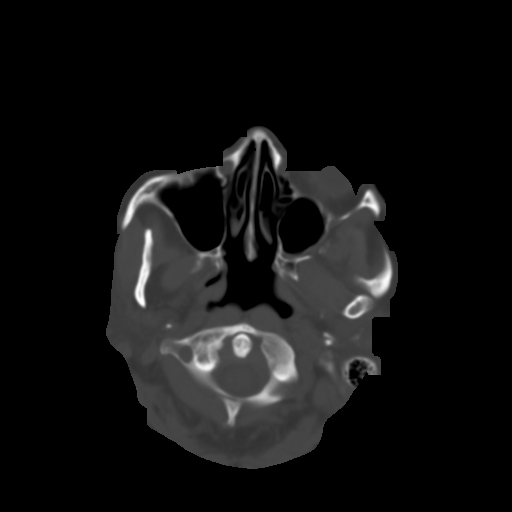
[im 5/32  brain]
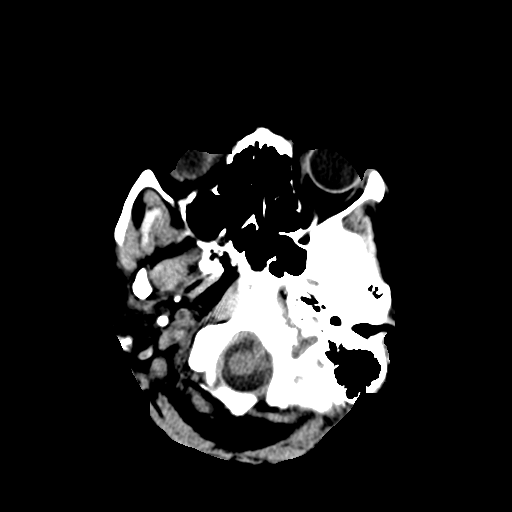
[im 7/32  brain]
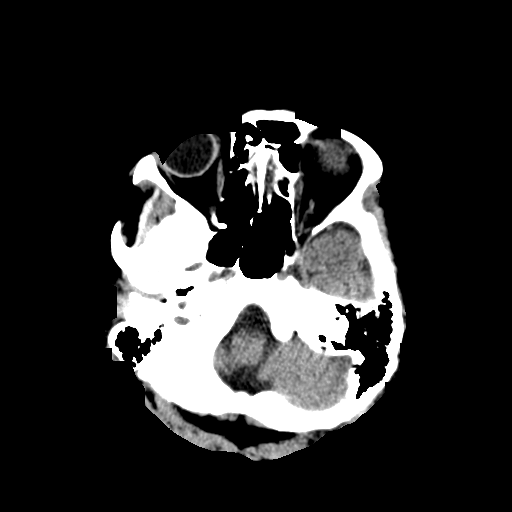
[im 9/32  brain]
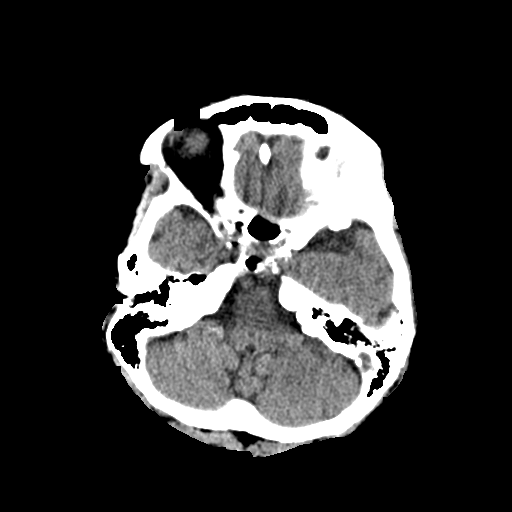
[im 12/32  brain]
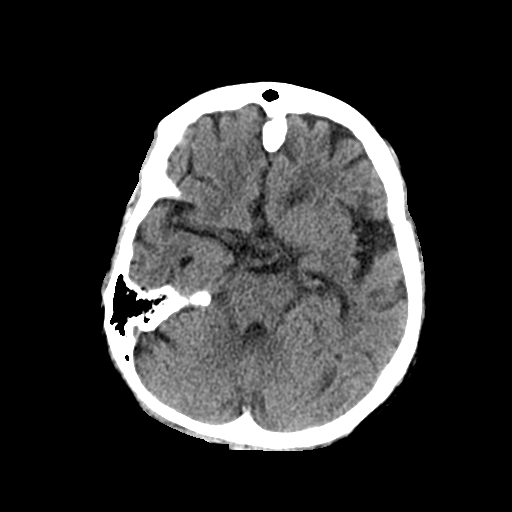
[im 12/32  bone]
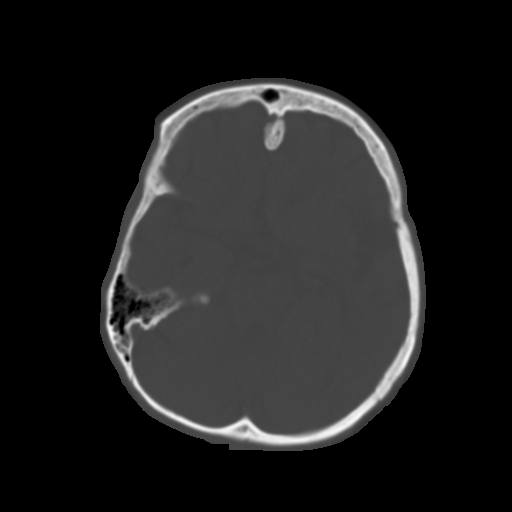
[im 14/32  brain]
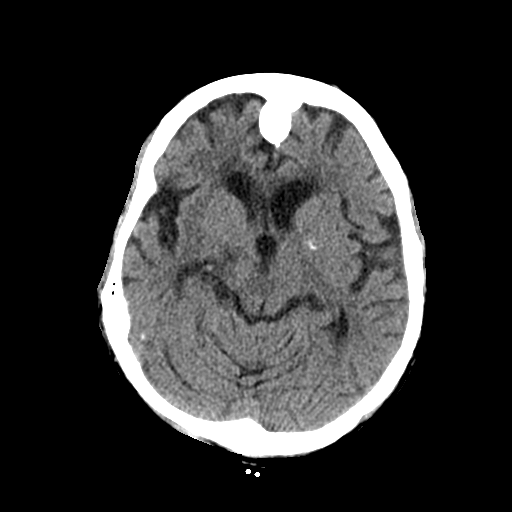
[im 16/32  brain]
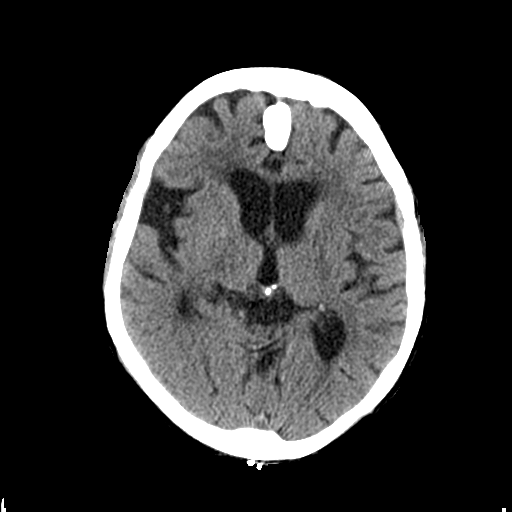
[im 18/32  brain]
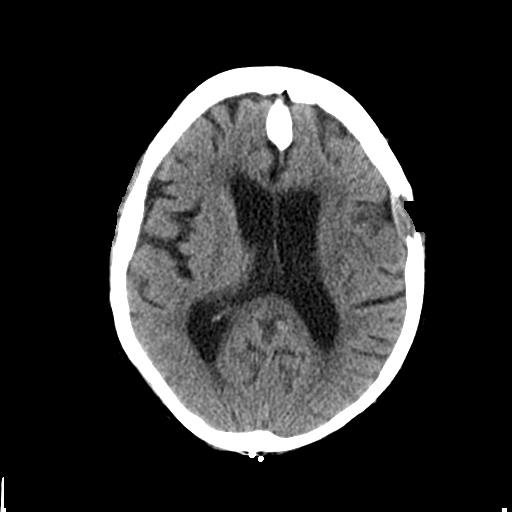
[im 20/32  brain]
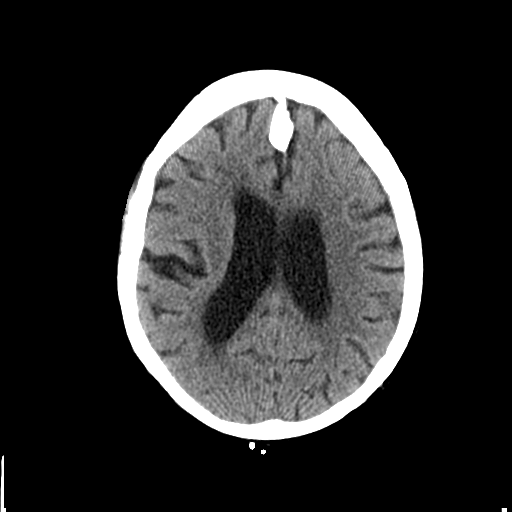
[im 20/32  bone]
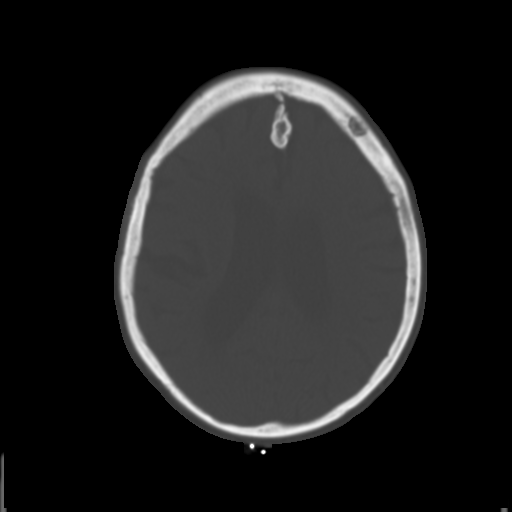
[im 23/32  brain]
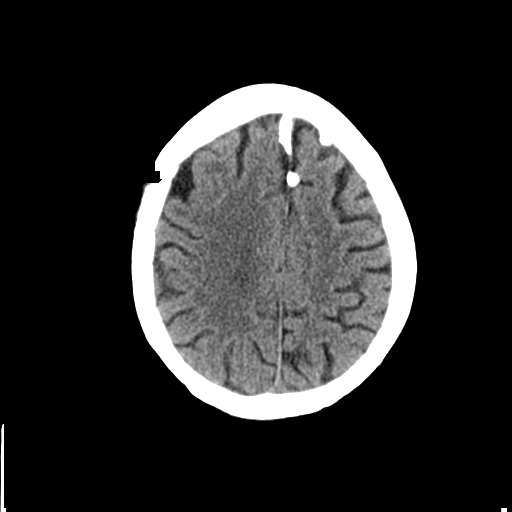
[im 25/32  brain]
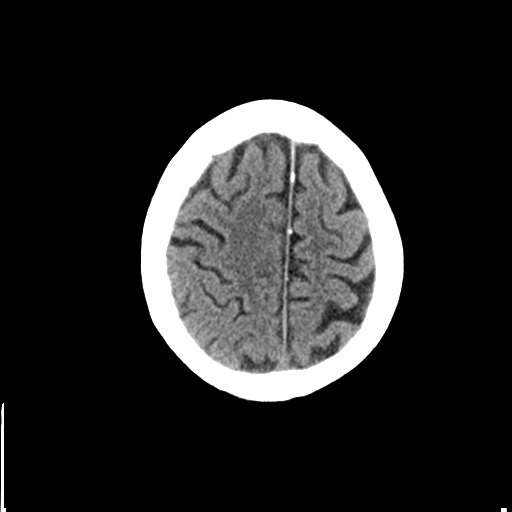
[im 27/32  brain]
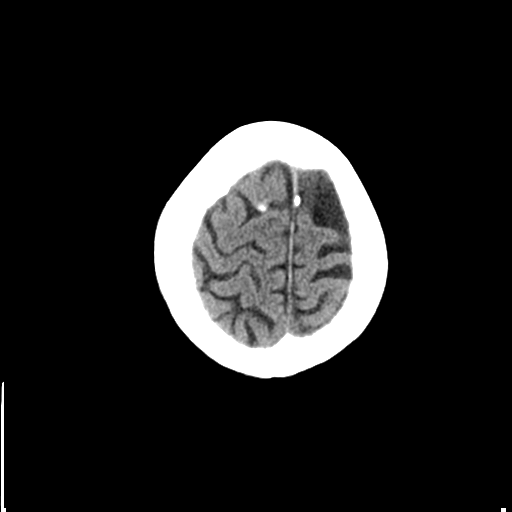
[im 29/32  brain]
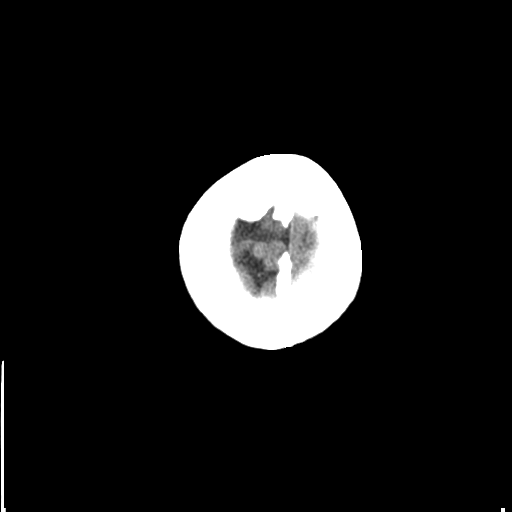
[im 29/32  bone]
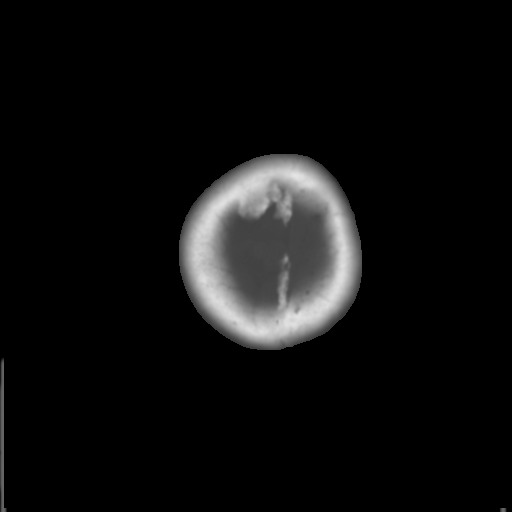

[Series 3: bone windows · axial · 0.45mm/px · z∈[+1397,+1442]mm · 3 of 32 slices shown]
[im 3/32  bone]
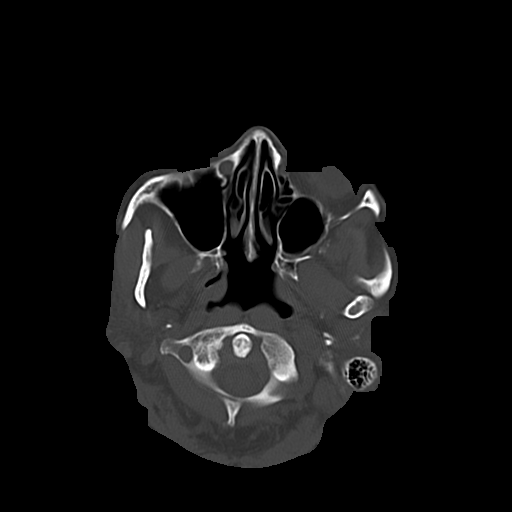
[im 7/32  bone]
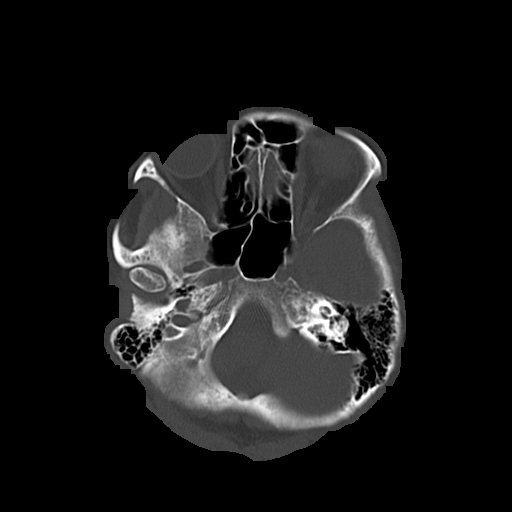
[im 12/32  bone]
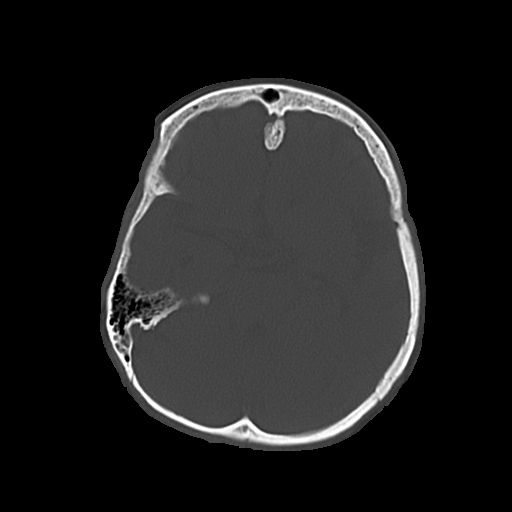

[16 of 30 positions shown; findings below may reference images not displayed]

FINDINGS: Scattered calvarial metastatic deposits are identified. The largest
lesion is in the left parietal bone where there is destruction of
both the inner and outer tables. No acute intracranial abnormality
including infarct, hemorrhage, mass lesion, mass effect, midline
shift or abnormal ex collection is identified there is some cortical
atrophy and chronic microvascular ischemic change.
IMPRESSION: No acute intracranial abnormality.

Lytic lesions in the calvarium consistent with metastatic renal cell
carcinoma.

Atrophy and chronic microvascular ischemic change.

## 2014-11-23 IMAGING — CR DG CHEST 2V
2 series · 2 of 2 positions shown · non-contrast
Comparison: CT chest 12/09/2013.  PA and lateral chest 12/22/2013.

CLINICAL DATA: Syncope. History of metastatic renal cell carcinoma.

EXAM:
CHEST  2 VIEW

[w chest lat]
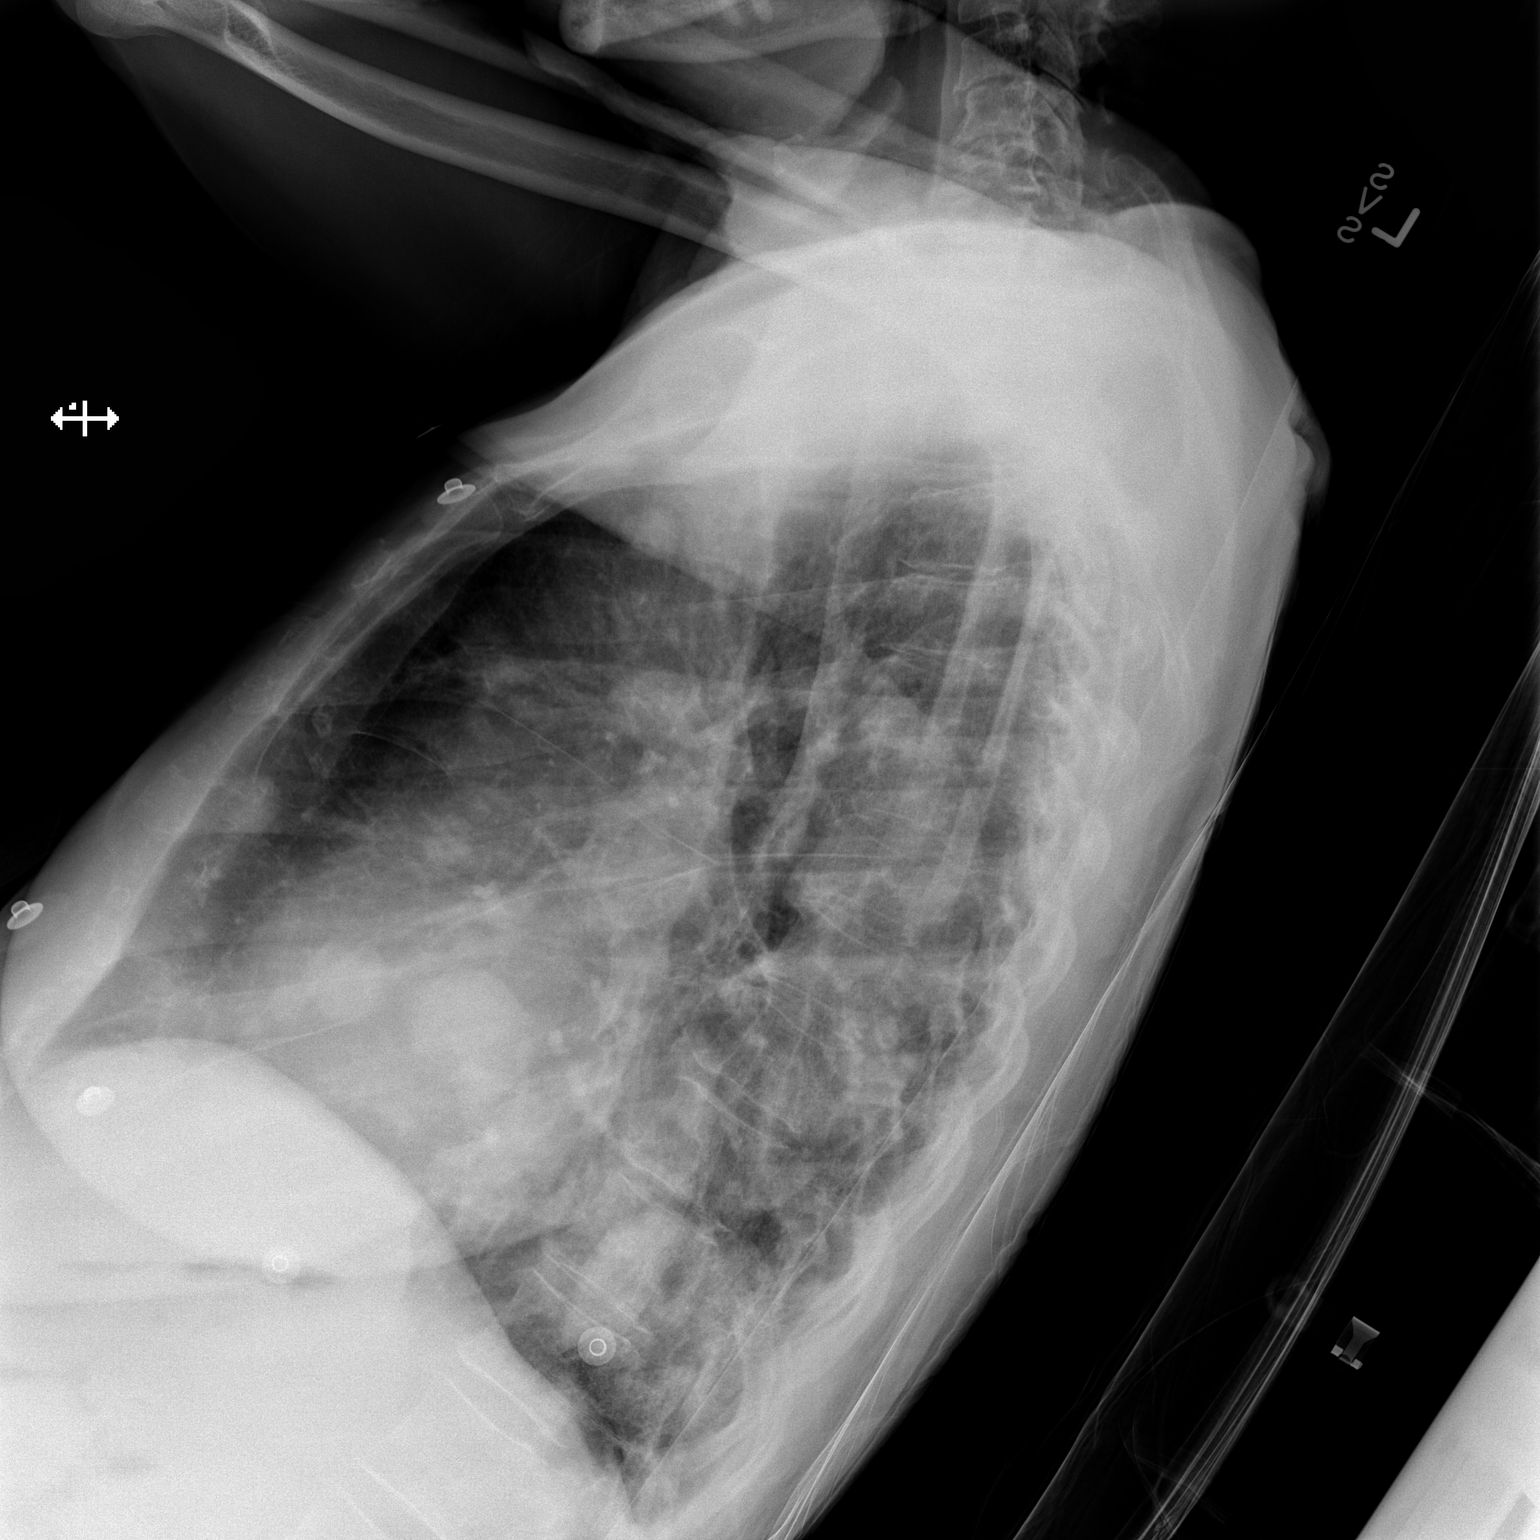

[x chest ap]
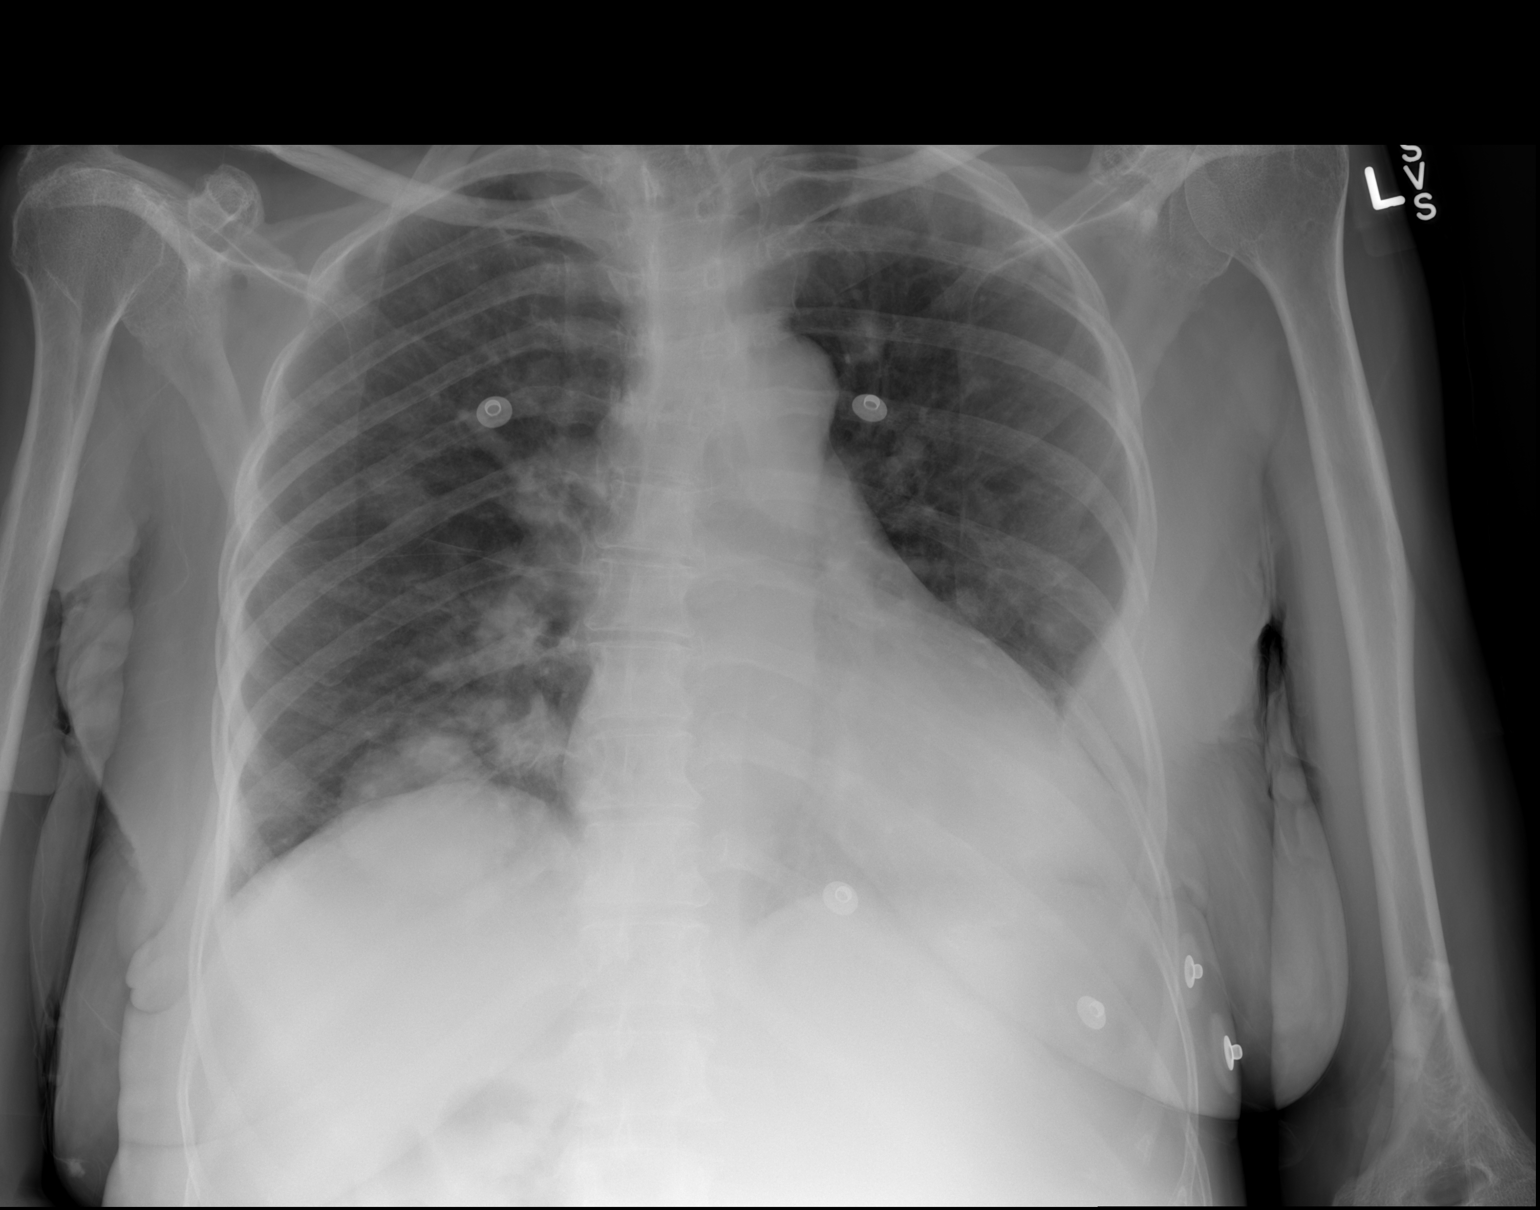

[2 of 2 positions shown; findings below may reference images not displayed]

FINDINGS: Bilateral pulmonary nodules and masses are seen. Peripheral opacity
in the left lower lung zone appears worsened. The appearance of the
chest is otherwise not markedly changed. Heart size is normal. No
pneumothorax is identified.
IMPRESSION: Pulmonary nodules and masses consistent with metastatic renal cell
carcinoma. Peripheral opacity in the left lower lung zone has
worsened and likely represents progressive metastatic disease.

## 2016-01-16 ENCOUNTER — Other Ambulatory Visit: Payer: Self-pay | Admitting: Nurse Practitioner
